# Patient Record
Sex: Male | Born: 1940 | Race: Black or African American | Hispanic: No | Marital: Married | Smoking: Former smoker
Health system: Southern US, Community
[De-identification: ages and names within clinical notes are randomized; demographics above are authoritative.]

## PROBLEM LIST (undated history)

## (undated) DIAGNOSIS — E039 Hypothyroidism, unspecified: Secondary | ICD-10-CM

## (undated) DIAGNOSIS — M549 Dorsalgia, unspecified: Secondary | ICD-10-CM

## (undated) DIAGNOSIS — R109 Unspecified abdominal pain: Secondary | ICD-10-CM

## (undated) DIAGNOSIS — R7989 Other specified abnormal findings of blood chemistry: Secondary | ICD-10-CM

## (undated) DIAGNOSIS — R7301 Impaired fasting glucose: Secondary | ICD-10-CM

## (undated) DIAGNOSIS — E119 Type 2 diabetes mellitus without complications: Secondary | ICD-10-CM

## (undated) DIAGNOSIS — Z125 Encounter for screening for malignant neoplasm of prostate: Secondary | ICD-10-CM

## (undated) DIAGNOSIS — E785 Hyperlipidemia, unspecified: Secondary | ICD-10-CM

## (undated) DIAGNOSIS — G5600 Carpal tunnel syndrome, unspecified upper limb: Secondary | ICD-10-CM

## (undated) DIAGNOSIS — I10 Essential (primary) hypertension: Secondary | ICD-10-CM

## (undated) DIAGNOSIS — H6120 Impacted cerumen, unspecified ear: Secondary | ICD-10-CM

## (undated) DIAGNOSIS — M25569 Pain in unspecified knee: Secondary | ICD-10-CM

## (undated) HISTORY — PX: OTHER SURGICAL HISTORY: SHX169

## (undated) HISTORY — DX: Hyperlipidemia, unspecified: E78.5

## (undated) HISTORY — DX: Essential (primary) hypertension: I10

## (undated) HISTORY — DX: Impacted cerumen, unspecified ear: H61.20

## (undated) HISTORY — DX: Unspecified abdominal pain: R10.9

## (undated) HISTORY — DX: Hypothyroidism, unspecified: E03.9

## (undated) HISTORY — DX: Encounter for screening for malignant neoplasm of prostate: Z12.5

## (undated) HISTORY — DX: Pain in unspecified knee: M25.569

## (undated) HISTORY — DX: Impaired fasting glucose: R73.01

## (undated) HISTORY — DX: Other specified abnormal findings of blood chemistry: R79.89

## (undated) HISTORY — DX: Dorsalgia, unspecified: M54.9

## (undated) HISTORY — DX: Type 2 diabetes mellitus without complications: E11.9

## (undated) HISTORY — DX: Carpal tunnel syndrome, unspecified upper limb: G56.00

---
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
## 2008-09-30 HISTORY — PX: OTHER SURGICAL HISTORY: SHX169

## 2008-12-21 LAB — HM COLONOSCOPY: HM Colonoscopy: NORMAL

## 2010-03-03 ENCOUNTER — Inpatient Hospital Stay (HOSPITAL_COMMUNITY): Admission: RE | Admit: 2010-03-03 | Discharge: 2010-03-06 | Payer: Self-pay | Admitting: Orthopedic Surgery

## 2010-03-03 HISTORY — PX: BACK SURGERY: SHX140

## 2010-07-21 ENCOUNTER — Encounter: Admission: RE | Admit: 2010-07-21 | Discharge: 2010-07-21 | Payer: Self-pay | Admitting: Internal Medicine

## 2011-03-13 LAB — GLUCOSE, CAPILLARY
Glucose-Capillary: 116 mg/dL — ABNORMAL HIGH (ref 70–99)
Glucose-Capillary: 138 mg/dL — ABNORMAL HIGH (ref 70–99)
Glucose-Capillary: 143 mg/dL — ABNORMAL HIGH (ref 70–99)
Glucose-Capillary: 181 mg/dL — ABNORMAL HIGH (ref 70–99)
Glucose-Capillary: 188 mg/dL — ABNORMAL HIGH (ref 70–99)
Glucose-Capillary: 189 mg/dL — ABNORMAL HIGH (ref 70–99)
Glucose-Capillary: 229 mg/dL — ABNORMAL HIGH (ref 70–99)

## 2011-03-13 LAB — TYPE AND SCREEN: Antibody Screen: NEGATIVE

## 2011-03-13 LAB — CBC
HCT: 47.3 % (ref 39.0–52.0)
MCHC: 34 g/dL (ref 30.0–36.0)
MCV: 92.6 fL (ref 78.0–100.0)
RBC: 5.11 MIL/uL (ref 4.22–5.81)
RDW: 12.4 % (ref 11.5–15.5)

## 2011-03-13 LAB — BASIC METABOLIC PANEL
Calcium: 10.1 mg/dL (ref 8.4–10.5)
Chloride: 101 mEq/L (ref 96–112)
GFR calc Af Amer: >60 mL/min (ref >60)
GFR calc non Af Amer: >60 mL/min (ref >60)

## 2012-03-06 ENCOUNTER — Other Ambulatory Visit: Payer: Self-pay | Admitting: Internal Medicine

## 2012-03-07 NOTE — Telephone Encounter (Signed)
Not out patient

## 2012-06-04 ENCOUNTER — Other Ambulatory Visit: Payer: Self-pay | Admitting: Internal Medicine

## 2012-06-04 NOTE — Telephone Encounter (Signed)
Pharmacy aware of reason for denial. 

## 2013-04-10 ENCOUNTER — Telehealth: Payer: Self-pay | Admitting: *Deleted

## 2013-04-10 NOTE — Telephone Encounter (Signed)
Patient called and stated that he would like to have the Nerve Conduction Study on both hands  [phone]

## 2013-04-12 NOTE — Telephone Encounter (Signed)
Ok.  Let's do that.  Electromyogram and nerve conduction study of bilateral upper extremities due to paresthesias of fingers, weakness.

## 2013-04-15 NOTE — Telephone Encounter (Signed)
[facility]  [address]  [place] [address] [phone] Phone  [phone] Fax   18 Apr 2011 at 7:30 AM  Patient has been notified

## 2013-04-24 ENCOUNTER — Other Ambulatory Visit: Payer: Self-pay | Admitting: *Deleted

## 2013-04-24 DIAGNOSIS — E785 Hyperlipidemia, unspecified: Secondary | ICD-10-CM

## 2013-04-24 DIAGNOSIS — R7301 Impaired fasting glucose: Secondary | ICD-10-CM

## 2013-04-24 DIAGNOSIS — E039 Hypothyroidism, unspecified: Secondary | ICD-10-CM

## 2013-04-24 DIAGNOSIS — I10 Essential (primary) hypertension: Secondary | ICD-10-CM

## 2013-05-19 ENCOUNTER — Other Ambulatory Visit: Payer: Self-pay

## 2013-05-22 ENCOUNTER — Ambulatory Visit: Payer: Self-pay | Admitting: Internal Medicine

## 2013-05-22 ENCOUNTER — Other Ambulatory Visit: Payer: Medicare Other

## 2013-05-22 ENCOUNTER — Encounter: Payer: Self-pay | Admitting: *Deleted

## 2013-05-22 DIAGNOSIS — E039 Hypothyroidism, unspecified: Secondary | ICD-10-CM

## 2013-05-22 DIAGNOSIS — E785 Hyperlipidemia, unspecified: Secondary | ICD-10-CM

## 2013-05-22 DIAGNOSIS — R7301 Impaired fasting glucose: Secondary | ICD-10-CM

## 2013-05-22 DIAGNOSIS — I10 Essential (primary) hypertension: Secondary | ICD-10-CM

## 2013-05-23 LAB — BASIC METABOLIC PANEL
BUN/Creatinine Ratio: 8 — ABNORMAL LOW (ref 10–22)
BUN: 8 mg/dL (ref 8–27)
CO2: 29 mmol/L — ABNORMAL HIGH (ref 19–28)
Calcium: 9.6 mg/dL (ref 8.6–10.2)
Chloride: 99 mmol/L (ref 97–108)
Creatinine, Ser: 0.98 mg/dL (ref 0.76–1.27)
GFR calc Af Amer: 90 mL/min/{1.73_m2} (ref >59)
GFR calc non Af Amer: 78 mL/min/{1.73_m2} (ref >59)
Glucose: 109 mg/dL — ABNORMAL HIGH (ref 65–99)
Potassium: 4 mmol/L (ref 3.5–5.2)
Sodium: 141 mmol/L (ref 134–144)

## 2013-05-23 LAB — LIPID PANEL
Chol/HDL Ratio: 3 ratio units (ref 0.0–5.0)
Cholesterol, Total: 155 mg/dL (ref 100–199)
HDL: 51 mg/dL (ref >39)
LDL Calculated: 83 mg/dL (ref 0–99)
Triglycerides: 104 mg/dL (ref 0–149)
VLDL Cholesterol Cal: 21 mg/dL (ref 5–40)

## 2013-05-23 LAB — HEMOGLOBIN A1C
Est. average glucose Bld gHb Est-mCnc: 134 mg/dL
Hgb A1c MFr Bld: 6.3 % — ABNORMAL HIGH (ref 4.8–5.6)

## 2013-05-23 LAB — TSH: TSH: 5.46 u[IU]/mL — ABNORMAL HIGH (ref 0.450–4.500)

## 2013-05-26 ENCOUNTER — Ambulatory Visit (INDEPENDENT_AMBULATORY_CARE_PROVIDER_SITE_OTHER): Payer: Medicare Other | Admitting: Internal Medicine

## 2013-05-26 ENCOUNTER — Encounter: Payer: Self-pay | Admitting: Internal Medicine

## 2013-05-26 VITALS — BP 106/72 | HR 60 | Temp 97.8°F | Resp 20 | Ht 65.5 in | Wt 148.0 lb

## 2013-05-26 DIAGNOSIS — I10 Essential (primary) hypertension: Secondary | ICD-10-CM

## 2013-05-26 DIAGNOSIS — E785 Hyperlipidemia, unspecified: Secondary | ICD-10-CM | POA: Insufficient documentation

## 2013-05-26 DIAGNOSIS — R7301 Impaired fasting glucose: Secondary | ICD-10-CM

## 2013-05-26 DIAGNOSIS — E039 Hypothyroidism, unspecified: Secondary | ICD-10-CM

## 2013-05-26 DIAGNOSIS — E1149 Type 2 diabetes mellitus with other diabetic neurological complication: Secondary | ICD-10-CM

## 2013-05-26 MED ORDER — METFORMIN HCL 500 MG PO TABS: 500.0000 mg | ORAL_TABLET | Freq: Two times a day (BID) | ORAL | Status: DC

## 2013-05-26 NOTE — Patient Instructions (Signed)
1800 Calorie Diet for Diabetes Meal Planning The 1800 calorie diet is designed for eating up to 1800 calories each day. Following this diet and making healthy meal choices can help improve overall health. This diet controls blood sugar (glucose) levels and can also help lower blood pressure and cholesterol. SERVING SIZES Measuring foods and serving sizes helps to make sure you are getting the right amount of food. The list below tells how big or small some common serving sizes are:  1 oz.........4 stacked dice.  3 oz........Marland KitchenDeck of cards.  1 tsp.......Marland KitchenTip of little finger.  1 tbs......Marland KitchenMarland KitchenThumb.  2 tbs.......Marland KitchenGolf ball.   cup......Marland KitchenHalf of a fist.  1 cup.......Marland KitchenA fist. GUIDELINES FOR CHOOSING FOODS The goal of this diet is to eat a variety of foods and limit calories to 1800 each day. This can be done by choosing foods that are low in calories and fat. The diet also suggests eating small amounts of food frequently. Doing this helps control your blood glucose levels so they do not get too high or too low. Each meal or snack may include a protein food source to help you feel more satisfied and to stabilize your blood glucose. Try to eat about the same amount of food around the same time each day. This includes weekend days, travel days, and days off work. Space your meals about 4 to 5 hours apart and add a snack between them if you wish.  For example, a daily food plan could include breakfast, a morning snack, lunch, dinner, and an evening snack. Healthy meals and snacks include whole grains, vegetables, fruits, lean meats, poultry, fish, and dairy products. As you plan your meals, select a variety of foods. Choose from the bread and starch, vegetable, fruit, dairy, and meat/protein groups. Examples of foods from each group and their suggested serving sizes are listed below. Use measuring cups and spoons to become familiar with what a healthy portion looks like. Bread and Starch Each serving  equals 15 grams of carbohydrates.  1 slice bread.   bagel.   cup cold cereal (unsweetened).   cup hot cereal or mashed potatoes.  1 small potato (size of a computer mouse).   cup cooked pasta or rice.   English muffin.  1 cup broth-based soup.  3 cups of popcorn.  4 to 6 whole-wheat crackers.   cup cooked beans, peas, or corn. Vegetable Each serving equals 5 grams of carbohydrates.   cup cooked vegetables.  1 cup raw vegetables.   cup tomato or vegetable juice. Fruit Each serving equals 15 grams of carbohydrates.  1 small apple or orange.  1 cup watermelon or strawberries.   cup applesauce (no sugar added).  2 tbs raisins.   banana.   cup canned fruit, packed in water, its own juice, or sweetened with a sugar substitute.   cup unsweetened fruit juice. Dairy Each serving equals 12 to 15 grams of carbohydrates.  1 cup fat-free milk.  6 oz artificially sweetened yogurt or plain yogurt.  1 cup low-fat buttermilk.  1 cup soy milk.  1 cup almond milk. Meat/Protein  1 large egg.  2 to 3 oz meat, poultry, or fish.   cup low-fat cottage cheese.  1 tbs peanut butter.  1 oz low-fat cheese.   cup tuna in water.   cup tofu. Fat  1 tsp oil.  1 tsp trans-fat-free margarine.  1 tsp butter.  1 tsp mayonnaise.  2 tbs avocado.  1 tbs salad dressing.  1 tbs cream cheese.  2 tbs sour cream. SAMPLE 1800 CALORIE DIET PLAN Breakfast   cup unsweetened cereal (1 carb serving).  1 cup fat-free milk (1 carb serving).  1 slice whole-wheat toast (1 carb serving).   small banana (1 carb serving).  1 scrambled egg.  1 tsp trans-fat-free margarine. Lunch  Tuna sandwich.  2 slices whole-wheat bread (2 carb servings).   cup canned tuna in water, drained.  1 tbs reduced fat mayonnaise.  1 stalk celery, chopped.  2 slices tomato.  1 lettuce leaf.  1 cup carrot sticks.  24 to 30 seedless grapes (2 carb  servings).  6 oz light yogurt (1 carb serving). Afternoon Snack  3 graham cracker squares (1 carb serving).  Fat-free milk, 1 cup (1 carb serving).  1 tbs peanut butter. Dinner  3 oz salmon, broiled with 1 tsp oil.  1 cup mashed potatoes (2 carb servings) with 1 tsp trans-fat-free margarine.  1 cup fresh or frozen green beans.  1 cup steamed asparagus.  1 cup fat-free milk (1 carb serving). Evening Snack  3 cups air-popped popcorn (1 carb serving).  2 tbs parmesan cheese sprinkled on top. MEAL PLAN Use this worksheet to help you make a daily meal plan based on the 1800 calorie diet suggestions. If you are using this plan to help you control your blood glucose, you may interchange carbohydrate-containing foods (dairy, starches, and fruits). Select a variety of fresh foods of varying colors and flavors. The total amount of carbohydrate in your meals or snacks is more important than making sure you include all of the food groups every time you eat. Choose from the following foods to build your day's meals:  8 Starches.  4 Vegetables.  3 Fruits.  2 Dairy.  6 to 7 oz Meat/Protein.  Up to 4 Fats. Your dietician can use this worksheet to help you decide how many servings and which types of foods are right for you. BREAKFAST Food Group and Servings / Food Choice Starch ________________________________________________________ Dairy _________________________________________________________ Fruit _________________________________________________________ Meat/Protein __________________________________________________ Fat ___________________________________________________________ LUNCH Food Group and Servings / Food Choice Starch ________________________________________________________ Meat/Protein __________________________________________________ Vegetable _____________________________________________________ Fruit  _________________________________________________________ Dairy _________________________________________________________ Fat ___________________________________________________________ Tyler Hull Food Group and Servings / Food Choice Starch ________________________________________________________ Meat/Protein __________________________________________________ Fruit __________________________________________________________ Dairy _________________________________________________________ Tyler Hull Food Group and Servings / Food Choice Starch _________________________________________________________ Meat/Protein ___________________________________________________ Dairy __________________________________________________________ Vegetable ______________________________________________________ Fruit ___________________________________________________________ Fat ____________________________________________________________ Tyler Hull Food Group and Servings / Food Choice Fruit __________________________________________________________ Meat/Protein ___________________________________________________ Dairy __________________________________________________________ Starch _________________________________________________________ DAILY TOTALS Starch ____________________________ Vegetable _________________________ Fruit _____________________________ Dairy _____________________________ Meat/Protein______________________ Fat _______________________________ Document Released: 06/26/2005 Document Revised: 02/26/2012 Document Reviewed: 10/20/2011 [employer] Patient Information 2014 [employer]  DRINK MORE WATER.  TRY WALKING MORE.

## 2013-05-26 NOTE — Progress Notes (Signed)
Patient ID: Tyler Hull, male   DOB: 1941-08-24, 72 y.o.   MRN: [id number]   Allergies  Allergen Reactions  . Penicillins   . Shellfish Allergy     Chief Complaint  Patient presents with  . Medical Managment of Chronic Issues    no new problems    HPI: Patient is a 72 y.o. AA male seen in the office today for f/u  He has cut out the french fries three times a week and the raisinets from [employer].  He still eats the goldfish in moderation.  Drinks a 20 oz soda twice a week.  Also drinks oj, apple juice, or pomegranate juice daily with breakfast.  Discussed drinking more water.  His wife is a model eater and exercises regularly and we discussed that he should follow her lead.    Review of Systems:  Review of Systems  Constitutional: Positive for weight loss. Negative for fever and malaise/fatigue.  HENT: Negative for congestion.   Eyes: Positive for blurred vision.  Respiratory: Negative for shortness of breath.   Cardiovascular: Negative for chest pain and leg swelling.  Gastrointestinal: Negative for abdominal pain and constipation.  Genitourinary: Negative for dysuria.  Musculoskeletal: Positive for joint pain. Negative for falls.  Skin: Negative for rash.  Neurological: Negative for loss of consciousness, weakness and headaches.  Psychiatric/Behavioral: Positive for memory loss.     Past Medical History  Diagnosis Date  . Carpal tunnel syndrome   . Impaired fasting glucose   . Other abnormal blood chemistry   . Special screening for malignant neoplasm of prostate   . Backache, unspecified   . Abdominal  pain, other specified site   . Impacted cerumen   . Unspecified hypothyroidism   . Essential hypertension, benign   . Unspecified essential hypertension   . Pain in joint, lower leg    Past Surgical History  Procedure Laterality Date  . (l) knee surgery"torn meniscus"     Social History:   reports that he has quit smoking. He does not have any smokeless  tobacco history on file. He reports that he does not drink alcohol or use illicit drugs.  No family history on file.  Medications: Patient's Medications  New Prescriptions   No medications on file  Previous Medications   ASPIRIN 81 MG TABLET    Take 81 mg by mouth daily. Take one tablet once a day   ATORVASTATIN (LIPITOR) 40 MG TABLET    Take 40 mg by mouth daily. Take one tablet once a day for cholesterol   CALCIUM CARBONATE-VITAMIN D (CALCIUM 600+D3) 600-400 MG-UNIT PER TABLET    Take 1 tablet by mouth daily. Take one tablet once a day   HYDROCHLOROTHIAZIDE (HYDRODIURIL) 25 MG TABLET    Take one and one half tablets by mouth once daily.   MULTIPLE VITAMINS-MINERALS (MULTIVITAMIN & MINERAL PO)    Take by mouth. Take one tablet once a day  Modified Medications   No medications on file  Discontinued Medications   No medications on file     Physical Exam:  Filed Vitals:   05/26/13 1635  BP: 106/72  Pulse: 60  Temp: 97.8 F (36.6 C)  TempSrc: Oral  Resp: 20  Height: 5' 5.5" (1.664 m)  Weight: 148 lb (67.132 kg)  SpO2: 98%  Physical Exam  Constitutional: He is oriented to person, place, and time. He appears well-developed and well-nourished. No distress.  HENT:  Head: Normocephalic and atraumatic.  Neck: Neck supple.  Cardiovascular:  Normal rate, regular rhythm, normal heart sounds and intact distal pulses.   Pulmonary/Chest: Effort normal and breath sounds normal. No respiratory distress.  Abdominal: Soft. Bowel sounds are normal. He exhibits no distension. There is no tenderness.  Musculoskeletal: Normal range of motion. He exhibits no edema and no tenderness.  Neurological: He is alert and oriented to person, place, and time.  Skin: Skin is warm and dry.       Labs reviewed: Basic Metabolic Panel:  Recent Labs  47/82/95 0820  NA 141  K 4.0  CL 99  CO2 29*  GLUCOSE 109*  BUN 8  CREATININE 0.98  CALCIUM 9.6  TSH 5.460*  Lipid Panel:  Recent Labs   05/22/13 0820  HDL 51  LDLCALC 83  TRIG 104  CHOLHDL 3.0   Assessment/Plan 1. Impaired fasting glucose -continued to encourage diet, exercise, has made some progress with his changes and he was commended for this, encouraged more water intake - Comprehensive metabolic panel; Future - Hemoglobin A1c; Future  2. Unspecified hypothyroidism -clinically and chemically euthyroid last check - TSH; Future  3. Essential hypertension, benign -at goal with current meds, avoid high sodium foods   4. Hyperlipidemia LDL goal < 100 -lipids at goal with current therapy and dietary changes, exercise  5. Type II or unspecified type diabetes mellitus with neurological manifestations, not stated as uncontrolled(250.60) -hba1c should improve by next check due to changes to diet he has made, weight loss and exercise  Labs/tests ordered:  Tsh, hba1c, cbc, bmp

## 2013-07-08 ENCOUNTER — Telehealth: Payer: Self-pay | Admitting: Geriatric Medicine

## 2013-07-08 NOTE — Telephone Encounter (Signed)
Patient says Metformin is making him lose a lot of weight. He has lost almost 10 pounds. Should he keep taking this medication? Please advise, thanks.

## 2013-07-09 NOTE — Telephone Encounter (Signed)
Metformin should help him lose weight.  If he has lost 10 lbs that gives him a normal bmi (weighing 138 lbs at 5 feet 5 in).  If his weight continues to drop b/c of diet and exercise, too, then we may need to reduce his dose or stop the medicine.  Please make sure his wife is also aware b/c he does not retain what I tell him.

## 2013-07-11 ENCOUNTER — Other Ambulatory Visit: Payer: Self-pay | Admitting: Geriatric Medicine

## 2013-07-11 NOTE — Telephone Encounter (Signed)
Spoke with patient and his wife. He will keep taking the medication and call us if he continues to lose weight.

## 2013-07-13 ENCOUNTER — Other Ambulatory Visit: Payer: Self-pay | Admitting: Nurse Practitioner

## 2013-08-08 ENCOUNTER — Other Ambulatory Visit: Payer: Self-pay | Admitting: Nurse Practitioner

## 2013-08-20 ENCOUNTER — Other Ambulatory Visit: Payer: Medicare Other

## 2013-08-20 DIAGNOSIS — R7301 Impaired fasting glucose: Secondary | ICD-10-CM

## 2013-08-20 DIAGNOSIS — E039 Hypothyroidism, unspecified: Secondary | ICD-10-CM

## 2013-08-20 DIAGNOSIS — I10 Essential (primary) hypertension: Secondary | ICD-10-CM

## 2013-08-21 LAB — CBC WITH DIFFERENTIAL/PLATELET
Basophils Absolute: 0 10*3/uL (ref 0.0–0.2)
Basos: 0 % (ref 0–3)
Eos: 3 % (ref 0–5)
Eosinophils Absolute: 0.3 10*3/uL (ref 0.0–0.4)
HCT: 44.7 % (ref 37.5–51.0)
Hemoglobin: 15.5 g/dL (ref 12.6–17.7)
Immature Grans (Abs): 0 10*3/uL (ref 0.0–0.1)
Immature Granulocytes: 0 % (ref 0–2)
Lymphocytes Absolute: 3.2 10*3/uL — ABNORMAL HIGH (ref 0.7–3.1)
Lymphs: 34 % (ref 14–46)
MCH: 30.8 pg (ref 26.6–33.0)
MCHC: 34.7 g/dL (ref 31.5–35.7)
MCV: 89 fL (ref 79–97)
Monocytes Absolute: 0.6 10*3/uL (ref 0.1–0.9)
Monocytes: 7 % (ref 4–12)
Neutrophils Absolute: 5.3 10*3/uL (ref 1.4–7.0)
Neutrophils Relative %: 56 % (ref 40–74)
RBC: 5.04 x10E6/uL (ref 4.14–5.80)
RDW: 12.7 % (ref 12.3–15.4)
WBC: 9.5 10*3/uL (ref 3.4–10.8)

## 2013-08-21 LAB — COMPREHENSIVE METABOLIC PANEL
ALT: 19 IU/L (ref 0–44)
AST: 19 IU/L (ref 0–40)
Albumin/Globulin Ratio: 2 (ref 1.1–2.5)
Albumin: 4.7 g/dL (ref 3.5–4.8)
Alkaline Phosphatase: 78 IU/L (ref 39–117)
BUN/Creatinine Ratio: 12 (ref 10–22)
BUN: 12 mg/dL (ref 8–27)
CO2: 27 mmol/L (ref 18–29)
Calcium: 9.6 mg/dL (ref 8.6–10.2)
Chloride: 99 mmol/L (ref 97–108)
Creatinine, Ser: 1.02 mg/dL (ref 0.76–1.27)
GFR calc Af Amer: 86 mL/min/{1.73_m2} (ref >59)
GFR calc non Af Amer: 74 mL/min/{1.73_m2} (ref >59)
Globulin, Total: 2.4 g/dL (ref 1.5–4.5)
Glucose: 87 mg/dL (ref 65–99)
Potassium: 3.9 mmol/L (ref 3.5–5.2)
Sodium: 141 mmol/L (ref 134–144)
Total Bilirubin: 0.8 mg/dL (ref 0.0–1.2)
Total Protein: 7.1 g/dL (ref 6.0–8.5)

## 2013-08-21 LAB — HEMOGLOBIN A1C
Est. average glucose Bld gHb Est-mCnc: 123 mg/dL
Hgb A1c MFr Bld: 5.9 % — ABNORMAL HIGH (ref 4.8–5.6)

## 2013-08-21 LAB — TSH: TSH: 3.97 u[IU]/mL (ref 0.450–4.500)

## 2013-08-22 ENCOUNTER — Other Ambulatory Visit: Payer: Self-pay

## 2013-08-22 ENCOUNTER — Ambulatory Visit (INDEPENDENT_AMBULATORY_CARE_PROVIDER_SITE_OTHER): Payer: Medicare Other | Admitting: Internal Medicine

## 2013-08-22 ENCOUNTER — Encounter: Payer: Self-pay | Admitting: Internal Medicine

## 2013-08-22 VITALS — BP 124/70 | HR 62 | Temp 97.9°F | Ht 65.0 in | Wt 141.2 lb

## 2013-08-22 DIAGNOSIS — E785 Hyperlipidemia, unspecified: Secondary | ICD-10-CM

## 2013-08-22 DIAGNOSIS — I10 Essential (primary) hypertension: Secondary | ICD-10-CM

## 2013-08-22 DIAGNOSIS — S8010XA Contusion of unspecified lower leg, initial encounter: Secondary | ICD-10-CM

## 2013-08-22 DIAGNOSIS — E1149 Type 2 diabetes mellitus with other diabetic neurological complication: Secondary | ICD-10-CM

## 2013-08-22 DIAGNOSIS — S8011XA Contusion of right lower leg, initial encounter: Secondary | ICD-10-CM

## 2013-08-22 DIAGNOSIS — E039 Hypothyroidism, unspecified: Secondary | ICD-10-CM

## 2013-08-22 MED ORDER — METFORMIN HCL 500 MG PO TABS: 250.0000 mg | ORAL_TABLET | Freq: Two times a day (BID) | ORAL | Status: DC

## 2013-08-22 NOTE — Progress Notes (Signed)
Patient ID: Tyler Hull, male   DOB: 1941-11-05, 72 y.o.   MRN: [id number] Location:  [facility] [place] [facility]  Code Status: need this from Misys chart where I previously reviewed it  Allergies  Allergen Reactions  . Penicillins   . Shellfish Allergy     Chief Complaint  Patient presents with  . Follow-up    3 month follow-up, discuss labs (copy printed)  . Leg Problem    injured right leg x 2 weeks ago  . Weight Loss  . Medication Management    Metformin causing GI issues     HPI: Patient is a 72 y.o. black male seen in the office today for f/u of diabetes, right leg injury, weight loss.  Ran into a metal platform with his right leg and has pressure in his shin now.  Mild tenderness where scabs are.    Is losing weight with metformin.  Feels like food is grinding up and feels nauseated sometimes.  Has bm soon after eating.  Weight down to 141 lbs.    Had NCS and carpal tunnel was identified, but he has chosen not to have surgery b/c his right hand is good for the time being.    Review of Systems:  Review of Systems  Constitutional: Positive for weight loss. Negative for fever and chills.  HENT: Positive for hearing loss. Negative for congestion.   Eyes: Negative for blurred vision.  Respiratory: Negative for cough and shortness of breath.   Cardiovascular: Negative for chest pain and leg swelling.  Gastrointestinal: Positive for nausea. Negative for abdominal pain, constipation, blood in stool and melena.  Genitourinary: Negative for dysuria, urgency and frequency.  Musculoskeletal: Negative for myalgias.       Right thigh pain and right lower leg pain  Neurological: Positive for sensory change. Negative for dizziness, speech change, focal weakness, loss of consciousness and headaches.  Psychiatric/Behavioral: Negative for depression.       Talks very quickly     Past Medical History  Diagnosis Date  . Carpal tunnel syndrome   . Impaired  fasting glucose   . Other abnormal blood chemistry   . Special screening for malignant neoplasm of prostate   . Backache, unspecified   . Abdominal  pain, other specified site   . Impacted cerumen   . Unspecified hypothyroidism   . Essential hypertension, benign   . Unspecified essential hypertension   . Pain in joint, lower leg     Past Surgical History  Procedure Laterality Date  . (l) knee surgery"torn meniscus"      Social History:   reports that he has quit smoking. He does not have any smokeless tobacco history on file. He reports that he does not drink alcohol or use illicit drugs.  History reviewed. No pertinent family history.  Medications: Patient's Medications  New Prescriptions   No medications on file  Previous Medications   ASPIRIN 81 MG TABLET    Take 81 mg by mouth daily. Take one tablet once a day   ATORVASTATIN (LIPITOR) 40 MG TABLET    TAKE 1 TABLET DAILY TO LOWER CHOLESTEROL   CALCIUM CARBONATE-VITAMIN D (CALCIUM 600+D3) 600-400 MG-UNIT PER TABLET    Take 1 tablet by mouth daily. Take one tablet once a day   HYDROCHLOROTHIAZIDE (HYDRODIURIL) 25 MG TABLET    TAKE 2 TABLETS ONCE DAILY FOR BLOOD PRESSURE   METFORMIN (GLUCOPHAGE) 500 MG TABLET    Take 1 tablet (500 mg total)  by mouth 2 (two) times daily with a meal.   MULTIPLE VITAMINS-MINERALS (MULTIVITAMIN & MINERAL PO)    Take by mouth. Take one tablet once a day  Modified Medications   No medications on file  Discontinued Medications   No medications on file     Physical Exam: Filed Vitals:   08/22/13 1127  BP: 124/70  Pulse: 62  Temp: 97.9 F (36.6 C)  TempSrc: Oral  Height: 5\' 5"  (1.651 m)  Weight: 141 lb 3.2 oz (64.048 kg)  SpO2: 97%  Physical Exam  Constitutional: He is oriented to person, place, and time. He appears well-developed and well-nourished. No distress.  HENT:  Head: Normocephalic and atraumatic.  Cardiovascular: Normal rate, regular rhythm, normal heart sounds and intact  distal pulses.   Pulmonary/Chest: Effort normal and breath sounds normal. No respiratory distress.  Abdominal: Soft. Bowel sounds are normal. He exhibits no distension and no mass.  Musculoskeletal: Normal range of motion.  Right lower leg with ecchymoses on anterior shin and several small eschars, tender to palpation  Neurological: He is alert and oriented to person, place, and time.  Psychiatric: He has a normal mood and affect.     Labs reviewed: Basic Metabolic Panel:  Recent Labs  16/10/96 0820 08/20/13 0819  NA 141 141  K 4.0 3.9  CL 99 99  CO2 29* 27  GLUCOSE 109* 87  BUN 8 12  CREATININE 0.98 1.02  CALCIUM 9.6 9.6  TSH 5.460* 3.970   Liver Function Tests:  Recent Labs  08/20/13 0819  AST 19  ALT 19  ALKPHOS 78  BILITOT 0.8  PROT 7.1  CBC:  Recent Labs  08/20/13 0819  WBC 9.5  NEUTROABS 5.3  HGB 15.5  HCT 44.7  MCV 89   Lipid Panel:  Recent Labs  05/22/13 0820  HDL 51  LDLCALC 83  TRIG 104  CHOLHDL 3.0   Lab Results  Component Value Date   HGBA1C 5.9* 08/20/2013   Assessment/Plan 1. Type II or unspecified type diabetes mellitus with neurological manifestations, not stated as uncontrolled(250.60) -cont low dose metformin which is helping with diabetic control and weight loss along with diet and minimal increase in activity - metFORMIN (GLUCOPHAGE) 500 MG tablet; Take 0.5 tablets (250 mg total) by mouth 2 (two) times daily with a meal.  Dispense: 90 tablet; Refill: 3 - Basic metabolic panel; Future - Hemoglobin A1c; Future  2. Unspecified hypothyroidism -cont current synthroid - TSH; Future  3. Hyperlipidemia LDL goal < 100 -cont statin therapy - Lipid panel; Future  4. Essential hypertension, benign -cont current bp meds and monitor - Basic metabolic panel; Future  5.  Right lower leg contusion -due to injury, seems to be healing appropriately, use tylenol prn pain  Labs/tests ordered: Orders Placed This Encounter  Procedures   . Basic metabolic panel    Standing Status: Future     Number of Occurrences: 1     Standing Expiration Date: 02/19/2014  . Hemoglobin A1c    Standing Status: Future     Number of Occurrences: 1     Standing Expiration Date: 02/19/2014  . Lipid panel    Standing Status: Future     Number of Occurrences: 1     Standing Expiration Date: 02/19/2014  . TSH    Standing Status: Future     Number of Occurrences: 1     Standing Expiration Date: 02/19/2014   Next appt:  3 mos

## 2013-09-17 ENCOUNTER — Other Ambulatory Visit: Payer: Self-pay | Admitting: Internal Medicine

## 2013-09-17 DIAGNOSIS — R200 Anesthesia of skin: Secondary | ICD-10-CM

## 2013-09-23 ENCOUNTER — Telehealth: Payer: Self-pay | Admitting: *Deleted

## 2013-09-23 ENCOUNTER — Ambulatory Visit: Payer: Self-pay

## 2013-09-23 NOTE — Telephone Encounter (Signed)
Patient called and stated that he had spoken with Amy concerning his leg pain and wanted to know what recommendations she suggested at the telephone call. I do not see any message in the chart and could not advise patient on their conversation. Patient stated that was fine and hung up.

## 2013-11-28 ENCOUNTER — Other Ambulatory Visit: Payer: Medicare Other

## 2013-11-28 ENCOUNTER — Encounter: Payer: Self-pay | Admitting: *Deleted

## 2013-11-28 DIAGNOSIS — E039 Hypothyroidism, unspecified: Secondary | ICD-10-CM

## 2013-11-28 DIAGNOSIS — I10 Essential (primary) hypertension: Secondary | ICD-10-CM

## 2013-11-28 DIAGNOSIS — E785 Hyperlipidemia, unspecified: Secondary | ICD-10-CM

## 2013-11-28 DIAGNOSIS — E1149 Type 2 diabetes mellitus with other diabetic neurological complication: Secondary | ICD-10-CM

## 2013-11-29 LAB — HEMOGLOBIN A1C
Est. average glucose Bld gHb Est-mCnc: 126 mg/dL
Hgb A1c MFr Bld: 6 % — ABNORMAL HIGH (ref 4.8–5.6)

## 2013-11-29 LAB — BASIC METABOLIC PANEL
BUN/Creatinine Ratio: 11 (ref 10–22)
BUN: 11 mg/dL (ref 8–27)
CO2: 31 mmol/L — ABNORMAL HIGH (ref 18–29)
Calcium: 10.1 mg/dL (ref 8.6–10.2)
Chloride: 99 mmol/L (ref 97–108)
Creatinine, Ser: 1.04 mg/dL (ref 0.76–1.27)
GFR calc Af Amer: 83 mL/min/{1.73_m2} (ref >59)
GFR calc non Af Amer: 72 mL/min/{1.73_m2} (ref >59)
Glucose: 96 mg/dL (ref 65–99)
Potassium: 4.1 mmol/L (ref 3.5–5.2)
Sodium: 142 mmol/L (ref 134–144)

## 2013-11-29 LAB — LIPID PANEL
Chol/HDL Ratio: 2.8 ratio units (ref 0.0–5.0)
Cholesterol, Total: 150 mg/dL (ref 100–199)
HDL: 54 mg/dL (ref >39)
LDL Calculated: 74 mg/dL (ref 0–99)
Triglycerides: 111 mg/dL (ref 0–149)
VLDL Cholesterol Cal: 22 mg/dL (ref 5–40)

## 2013-11-29 LAB — TSH: TSH: 2.69 u[IU]/mL (ref 0.450–4.500)

## 2013-12-01 ENCOUNTER — Ambulatory Visit (INDEPENDENT_AMBULATORY_CARE_PROVIDER_SITE_OTHER): Payer: Medicare Other | Admitting: Internal Medicine

## 2013-12-01 ENCOUNTER — Encounter: Payer: Self-pay | Admitting: Internal Medicine

## 2013-12-01 VITALS — BP 118/66 | HR 68 | Temp 97.8°F | Resp 14 | Wt 144.8 lb

## 2013-12-01 DIAGNOSIS — E785 Hyperlipidemia, unspecified: Secondary | ICD-10-CM

## 2013-12-01 DIAGNOSIS — E1149 Type 2 diabetes mellitus with other diabetic neurological complication: Secondary | ICD-10-CM

## 2013-12-01 DIAGNOSIS — S81811A Laceration without foreign body, right lower leg, initial encounter: Secondary | ICD-10-CM

## 2013-12-01 DIAGNOSIS — E039 Hypothyroidism, unspecified: Secondary | ICD-10-CM

## 2013-12-01 DIAGNOSIS — R200 Anesthesia of skin: Secondary | ICD-10-CM

## 2013-12-01 DIAGNOSIS — R209 Unspecified disturbances of skin sensation: Secondary | ICD-10-CM

## 2013-12-01 DIAGNOSIS — S81009A Unspecified open wound, unspecified knee, initial encounter: Secondary | ICD-10-CM

## 2013-12-01 DIAGNOSIS — I10 Essential (primary) hypertension: Secondary | ICD-10-CM

## 2013-12-01 NOTE — Progress Notes (Signed)
Patient ID: Tyler Hull, male   DOB: 1941/10/17, 72 y.o.   MRN: [id number]   Location:  [facility]   Allergies  Allergen Reactions  . Penicillins   . Shellfish Allergy     Chief Complaint  Patient presents with  . Medical Managment of Chronic Issues    f/u with labs printed.  [name] other    bump on he RT lower leg x 1 week.    HPI: Patient is a 72 y.o. black male seen in the office today for medical mgt of chronic diseases and a bump on his right lower leg for a week.  Ran into metal, was swollen, has been putting ice on it.  Has had his tetanus shot.    Went to therapy for numbness of right leg.  Dr Ranell Patrick said he may need to remove the nerve.  Still gets hot and painful and wakes from sleep sometimes.  Improved a little with therapy.  Review of Systems:  Review of Systems  Constitutional: Positive for weight loss. Negative for fever, chills and malaise/fatigue.  Eyes: Negative for blurred vision.  Respiratory: Negative for shortness of breath.   Cardiovascular: Negative for chest pain.  Gastrointestinal: Negative for abdominal pain, constipation, blood in stool and melena.  Genitourinary: Negative for dysuria.  Musculoskeletal: Negative for myalgias.  Skin:       Skin tear right shin from hitting against metal  Neurological: Positive for sensory change. Negative for headaches.       Right anterior thigh  Psychiatric/Behavioral: Positive for memory loss. Negative for depression.     Past Medical History  Diagnosis Date  . Carpal tunnel syndrome   . Impaired fasting glucose   . Other abnormal blood chemistry   . Special screening for malignant neoplasm of prostate   . Backache, unspecified   . Abdominal  pain, other specified site   . Impacted cerumen   . Unspecified hypothyroidism   . Essential hypertension, benign   . Unspecified essential hypertension   . Pain in joint, lower leg     Past Surgical History  Procedure  Laterality Date  . (l) knee surgery"torn meniscus"      Social History:   reports that he has quit smoking. He does not have any smokeless tobacco history on file. He reports that he does not drink alcohol or use illicit drugs.  History reviewed. No pertinent family history.  Medications: Patient's Medications  New Prescriptions   No medications on file  Previous Medications   ASPIRIN 81 MG TABLET    Take 81 mg by mouth daily. Take one tablet once a day   ATORVASTATIN (LIPITOR) 40 MG TABLET    TAKE 1 TABLET DAILY TO LOWER CHOLESTEROL   CALCIUM CARBONATE-VITAMIN D (CALCIUM 600+D3) 600-400 MG-UNIT PER TABLET    Take 1 tablet by mouth daily. Take one tablet once a day   HYDROCHLOROTHIAZIDE (HYDRODIURIL) 25 MG TABLET    TAKE 2 TABLETS ONCE DAILY FOR BLOOD PRESSURE   METFORMIN (GLUCOPHAGE) 500 MG TABLET    Take 0.5 tablets (250 mg total) by mouth 2 (two) times daily with a meal.   MULTIPLE VITAMINS-MINERALS (MULTIVITAMIN & MINERAL PO)    Take by mouth. Take one tablet once a day  Modified Medications   No medications on file  Discontinued Medications   No medications on file     Physical Exam: Filed Vitals:   12/01/13 1109  BP: 118/66  Pulse: 68  Temp: 97.8 F (36.6 C)  TempSrc: Oral  Resp: 14  Weight: 144 lb 12.8 oz (65.681 kg)  SpO2: 97%  Physical Exam  Constitutional: He is oriented to person, place, and time. He appears well-developed and well-nourished.  Cardiovascular: Normal rate, regular rhythm, normal heart sounds and intact distal pulses.   Pulmonary/Chest: Effort normal and breath sounds normal. No respiratory distress.  Abdominal: Soft. Bowel sounds are normal. He exhibits no distension. There is no tenderness.  Musculoskeletal: Normal range of motion. He exhibits no edema and no tenderness.  Neurological: He is alert and oriented to person, place, and time. No cranial nerve deficit.  Skin: Skin is warm and dry.  Small skin tear of right anterior shin, mild edema,  no erythema, drainage present  Psychiatric: He has a normal mood and affect.   Labs reviewed: Basic Metabolic Panel:  Recent Labs  09/81/19 0820 08/20/13 0819 11/28/13 0851  NA 141 141 142  K 4.0 3.9 4.1  CL 99 99 99  CO2 29* 27 31*  GLUCOSE 109* 87 96  BUN 8 12 11   CREATININE 0.98 1.02 1.04  CALCIUM 9.6 9.6 10.1  TSH 5.460* 3.970 2.690   Liver Function Tests:  Recent Labs  08/20/13 0819  AST 19  ALT 19  ALKPHOS 78  BILITOT 0.8  PROT 7.1  CBC:  Recent Labs  08/20/13 0819  WBC 9.5  NEUTROABS 5.3  HGB 15.5  HCT 44.7  MCV 89   Lipid Panel:  Recent Labs  05/22/13 0820 11/28/13 0851  HDL 51 54  LDLCALC 83 74  TRIG 104 111  CHOLHDL 3.0 2.8   Lab Results  Component Value Date   HGBA1C 6.0* 11/28/2013   Assessment/Plan 1. Type II or unspecified type diabetes mellitus with neurological manifestations, not stated as uncontrolled(250.60) -cont metformin for now--hba1c was 6 which is great -he is a very healthy 72 and does not have difficulty with hypoglycemia and eats well -f/u hba1c in 6 mos 2. Hyperlipidemia LDL goal < 100 -lipids finally at goal with dietary changes and walking -encouraged, cont statin 3. Unspecified hypothyroidism -TSH wnl with current therapy, clinically euthyroid 4. Essential hypertension, benign -bp at goal with current therapy, no changes, cont to watch sodium 5. Anterior thigh numbness -persists esp when seated and at night -cont to follow with Dr. Ranell Patrick and Dr. Shon Baton, had prior back surgery 6. Skin tear of lower leg without complication, right, initial encounter -keep clean and dry and watch for signs of infection Labs/tests ordered:  Cbc, cmp, flp, hba1c before next visit Next appt:  6 mos

## 2014-05-21 ENCOUNTER — Other Ambulatory Visit: Payer: Medicare Other

## 2014-05-21 DIAGNOSIS — I10 Essential (primary) hypertension: Secondary | ICD-10-CM

## 2014-05-21 DIAGNOSIS — E785 Hyperlipidemia, unspecified: Secondary | ICD-10-CM

## 2014-05-21 DIAGNOSIS — E1149 Type 2 diabetes mellitus with other diabetic neurological complication: Secondary | ICD-10-CM

## 2014-05-21 DIAGNOSIS — E039 Hypothyroidism, unspecified: Secondary | ICD-10-CM

## 2014-05-22 LAB — CBC WITH DIFFERENTIAL/PLATELET
Basophils Absolute: 0 10*3/uL (ref 0.0–0.2)
Basos: 0 %
Eos: 3 %
Eosinophils Absolute: 0.2 10*3/uL (ref 0.0–0.4)
HCT: 47.1 % (ref 37.5–51.0)
Hemoglobin: 16.1 g/dL (ref 12.6–17.7)
Immature Grans (Abs): 0 10*3/uL (ref 0.0–0.1)
Immature Granulocytes: 0 %
Lymphocytes Absolute: 3 10*3/uL (ref 0.7–3.1)
Lymphs: 33 %
MCH: 30.5 pg (ref 26.6–33.0)
MCHC: 34.2 g/dL (ref 31.5–35.7)
MCV: 89 fL (ref 79–97)
Monocytes Absolute: 0.6 10*3/uL (ref 0.1–0.9)
Monocytes: 7 %
Neutrophils Absolute: 5.3 10*3/uL (ref 1.4–7.0)
Neutrophils Relative %: 57 %
RBC: 5.28 x10E6/uL (ref 4.14–5.80)
RDW: 12.8 % (ref 12.3–15.4)
WBC: 9.2 10*3/uL (ref 3.4–10.8)

## 2014-05-22 LAB — LIPID PANEL
Chol/HDL Ratio: 3 ratio units (ref 0.0–5.0)
Cholesterol, Total: 149 mg/dL (ref 100–199)
HDL: 50 mg/dL (ref >39)
LDL Calculated: 81 mg/dL (ref 0–99)
Triglycerides: 92 mg/dL (ref 0–149)
VLDL Cholesterol Cal: 18 mg/dL (ref 5–40)

## 2014-05-22 LAB — COMPREHENSIVE METABOLIC PANEL
ALT: 21 IU/L (ref 0–44)
AST: 22 IU/L (ref 0–40)
Albumin/Globulin Ratio: 2 (ref 1.1–2.5)
Albumin: 4.7 g/dL (ref 3.5–4.8)
Alkaline Phosphatase: 79 IU/L (ref 39–117)
BUN/Creatinine Ratio: 10 (ref 10–22)
BUN: 10 mg/dL (ref 8–27)
CO2: 28 mmol/L (ref 18–29)
Calcium: 9.4 mg/dL (ref 8.6–10.2)
Chloride: 99 mmol/L (ref 97–108)
Creatinine, Ser: 1.04 mg/dL (ref 0.76–1.27)
GFR calc Af Amer: 83 mL/min/{1.73_m2} (ref >59)
GFR calc non Af Amer: 72 mL/min/{1.73_m2} (ref >59)
Globulin, Total: 2.3 g/dL (ref 1.5–4.5)
Glucose: 98 mg/dL (ref 65–99)
Potassium: 4 mmol/L (ref 3.5–5.2)
Sodium: 140 mmol/L (ref 134–144)
Total Bilirubin: 0.6 mg/dL (ref 0.0–1.2)
Total Protein: 7 g/dL (ref 6.0–8.5)

## 2014-05-22 LAB — HEMOGLOBIN A1C
Est. average glucose Bld gHb Est-mCnc: 126 mg/dL
Hgb A1c MFr Bld: 6 % — ABNORMAL HIGH (ref 4.8–5.6)

## 2014-05-22 LAB — TSH: TSH: 4.5 u[IU]/mL (ref 0.450–4.500)

## 2014-05-24 ENCOUNTER — Other Ambulatory Visit: Payer: Self-pay | Admitting: Internal Medicine

## 2014-05-25 ENCOUNTER — Ambulatory Visit (INDEPENDENT_AMBULATORY_CARE_PROVIDER_SITE_OTHER): Payer: Medicare Other | Admitting: Internal Medicine

## 2014-05-25 ENCOUNTER — Encounter: Payer: Self-pay | Admitting: Internal Medicine

## 2014-05-25 VITALS — BP 130/74 | HR 60 | Resp 10 | Wt 142.0 lb

## 2014-05-25 DIAGNOSIS — E039 Hypothyroidism, unspecified: Secondary | ICD-10-CM

## 2014-05-25 DIAGNOSIS — H53459 Other localized visual field defect, unspecified eye: Secondary | ICD-10-CM

## 2014-05-25 DIAGNOSIS — W19XXXA Unspecified fall, initial encounter: Secondary | ICD-10-CM

## 2014-05-25 DIAGNOSIS — E1149 Type 2 diabetes mellitus with other diabetic neurological complication: Secondary | ICD-10-CM

## 2014-05-25 DIAGNOSIS — E785 Hyperlipidemia, unspecified: Secondary | ICD-10-CM

## 2014-05-25 DIAGNOSIS — I10 Essential (primary) hypertension: Secondary | ICD-10-CM

## 2014-05-25 NOTE — Progress Notes (Signed)
Patient ID: Tyler Hull, male   DOB: 1941-01-14, 73 y.o.   MRN: [id number]   Location:  [facility]   Allergies  Allergen Reactions  . Penicillins   . Shellfish Allergy     Chief Complaint  Patient presents with  . Medical Management of Chronic Issues    6 month follow-up, discuss labs (copy printed)     HPI: Patient is a 73 y.o. black male seen in the office today for 6 month f/u.  About a month ago, he ran into a platform that the tv sits on in gazebo.  Went to pick something else, hit nose on it, fell over.  Last week, did same thing, hitting his forehead.  Put sponges all around the edges.  Did not lose consciousness either time.  Knows these happened due to his poor vision.    Has been exercising.  Walking on treadmill, doing silver sneakers aerobics and chair yoga and cardio--each are 45 mins, then he uses the equipment while his wife walks.    Labs reviewed--all good at this time.      Hip pain has gone away with exercise.  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      No numbness or tingling in the feet.  Still with tingling in his right hand.  Has carpal tunnel.    Takes 1/2 pill in the am and full pill at night.   Review of Systems:  Review of Systems  Constitutional: Negative for fever and malaise/fatigue.  HENT: Negative for congestion and hearing loss.   Eyes: Positive for double vision.  Respiratory: Negative for shortness of breath.   Cardiovascular: Negative for chest pain and leg swelling.  Gastrointestinal: Negative for abdominal pain, constipation, blood in stool and melena.  Genitourinary: Negative for dysuria.  Musculoskeletal: Positive for falls. Negative for myalgias.  Skin: Negative for rash.  Neurological: Positive for tingling and sensory change. Negative for dizziness, loss of consciousness and weakness.  Endo/Heme/Allergies: Does not bruise/bleed easily.  Psychiatric/Behavioral: Negative for  memory loss.    Past Medical History  Diagnosis Date  . Carpal tunnel syndrome   . Impaired fasting glucose   . Other abnormal blood chemistry   . Special screening for malignant neoplasm of prostate   . Backache, unspecified   . Abdominal pain, other specified site   . Impacted cerumen   . Unspecified hypothyroidism   . Essential hypertension, benign   . Unspecified essential hypertension   . Pain in joint, lower leg     Past Surgical History  Procedure Laterality Date  . (l) knee surgery"torn meniscus"      Social History:   reports that he has quit smoking. He does not have any smokeless tobacco history on file. He reports that he does not drink alcohol or use illicit drugs.  History reviewed. No pertinent family history.  Medications: Patient's Medications  New Prescriptions   No medications on file  Previous Medications   ASPIRIN 81 MG TABLET    Take 81 mg by mouth daily. Take one tablet once a day   ATORVASTATIN (LIPITOR) 40 MG TABLET    TAKE 1 TABLET DAILY TO LOWER CHOLESTEROL   CALCIUM CARBONATE-VITAMIN D (CALCIUM 600+D3) 600-400 MG-UNIT PER TABLET    Take 1 tablet by mouth daily. Take one tablet once a day   HYDROCHLOROTHIAZIDE (HYDRODIURIL) 25 MG TABLET    TAKE 2 TABLETS ONCE DAILY FOR BLOOD PRESSURE   METFORMIN (  GLUCOPHAGE) 500 MG TABLET    Take 0.5 tablets (250 mg total) by mouth 2 (two) times daily with a meal.   MULTIPLE VITAMINS-MINERALS (MULTIVITAMIN & MINERAL PO)    Take by mouth. Take one tablet once a day  Modified Medications   No medications on file  Discontinued Medications   No medications on file     Physical Exam: Filed Vitals:   05/25/14 1007  BP: 130/74  Pulse: 60  Resp: 10  Weight: 142 lb (64.411 kg)  SpO2: 97%  Physical Exam  Constitutional: He is oriented to person, place, and time. He appears well-developed and well-nourished. No distress.  HENT:  Head: Normocephalic and atraumatic.  Eyes: Pupils are equal, round, and reactive  to light.  Cardiovascular: Normal rate, regular rhythm, normal heart sounds and intact distal pulses.   Pulmonary/Chest: Effort normal and breath sounds normal. No respiratory distress.  Abdominal: Soft. Bowel sounds are normal. He exhibits no distension and no mass. There is no tenderness.  Musculoskeletal: Normal range of motion. He exhibits no edema and no tenderness.  Neurological: He is alert and oriented to person, place, and time.  Skin: Skin is warm and dry.  Psychiatric: He has a normal mood and affect.   Labs reviewed: Basic Metabolic Panel:  Recent Labs  16/09/9608/03/14 0819 11/28/13 0851 05/21/14 0900  NA 141 142 140  K 3.9 4.1 4.0  CL 99 99 99  CO2 27 31* 28  GLUCOSE 87 96 98  BUN 12 11 10   CREATININE 1.02 1.04 1.04  CALCIUM 9.6 10.1 9.4  TSH 3.970 2.690 4.500   Liver Function Tests:  Recent Labs  08/20/13 0819 05/21/14 0900  AST 19 22  ALT 19 21  ALKPHOS 78 79  BILITOT 0.8 0.6  PROT 7.1 7.0  CBC:  Recent Labs  08/20/13 0819 05/21/14 0900  WBC 9.5 9.2  NEUTROABS 5.3 5.3  HGB 15.5 16.1  HCT 44.7 47.1  MCV 89 89   Lipid Panel:  Recent Labs  11/28/13 0851 05/21/14 0900  HDL 54 50  LDLCALC 74 81  TRIG 111 92  CHOLHDL 2.8 3.0   Lab Results  Component Value Date   HGBA1C 6.0* 05/21/2014   Assessment/Plan 1. Type II or unspecified type diabetes mellitus with neurological manifestations, not stated as uncontrolled -is well controlled with diet and exercise and low dose metformin as above -cont asa, statin, will need urine microalbumin at physical--not on ace--may need to change diuretic to ace  2. Hyperlipidemia LDL goal < 100 -cont diet, exercise and statin therapy--is at goal  3. Essential hypertension, benign -at goal with current therapy  4. Hypothyroidism -tsh wnl--has not required meds  5. Fall x2 since last appt -due to his left peripheral vision loss  6. Peripheral vision loss of left eye -occurred many years ago--tells story  about kitty litter going into his eye and then [facility] -here he has been seeing optometrist and was referred from there to an ophthalmologist at [facility], but he and his wife don't want to drive there and would like a "one stop shop" (pt [facility]) -will refer to opthalmologist locally, Dr. Mercy RidingHecker--is due for annual exam in August  Labs/tests ordered:   Orders Placed This Encounter  Procedures  . CBC With differential/Platelet    Standing Status: Future     Number of Occurrences:      Standing Expiration Date: 11/24/2014  . Lipid panel  Standing Status: Future     Number of Occurrences:      Standing Expiration Date: 11/24/2014    Order Specific Question:  Has the patient fasted?    Answer:  Yes  . Hemoglobin A1c    Standing Status: Future     Number of Occurrences:      Standing Expiration Date: 11/24/2014  . Comprehensive metabolic panel    Standing Status: Future     Number of Occurrences:      Standing Expiration Date: 11/24/2014    Order Specific Question:  Has the patient fasted?    Answer:  Yes  . TSH    Standing Status: Future     Number of Occurrences:      Standing Expiration Date: 11/24/2014  . Ambulatory referral to Ophthalmology    Referral Priority:  Routine    Referral Type:  Consultation    Referral Reason:  Specialty Services Required    Requested Specialty:  Ophthalmology    Number of Visits Requested:  1    Next appt:6 months for annual exam labs before

## 2014-06-05 ENCOUNTER — Emergency Department (HOSPITAL_COMMUNITY)
Admission: EM | Admit: 2014-06-05 | Discharge: 2014-06-05 | Disposition: A | Discharge status: Home / Self Care | Payer: Medicare Other | Attending: Emergency Medicine | Admitting: Emergency Medicine

## 2014-06-05 ENCOUNTER — Telehealth: Payer: Self-pay | Admitting: *Deleted

## 2014-06-05 ENCOUNTER — Other Ambulatory Visit: Payer: Medicare Other

## 2014-06-05 ENCOUNTER — Other Ambulatory Visit: Payer: Self-pay | Admitting: *Deleted

## 2014-06-05 ENCOUNTER — Encounter (HOSPITAL_COMMUNITY): Payer: Self-pay | Admitting: Emergency Medicine

## 2014-06-05 DIAGNOSIS — Z87891 Personal history of nicotine dependence: Secondary | ICD-10-CM | POA: Insufficient documentation

## 2014-06-05 DIAGNOSIS — Z862 Personal history of diseases of the blood and blood-forming organs and certain disorders involving the immune mechanism: Secondary | ICD-10-CM | POA: Insufficient documentation

## 2014-06-05 DIAGNOSIS — Z79899 Other long term (current) drug therapy: Secondary | ICD-10-CM | POA: Insufficient documentation

## 2014-06-05 DIAGNOSIS — Z880 Allergy status to penicillin: Secondary | ICD-10-CM | POA: Insufficient documentation

## 2014-06-05 DIAGNOSIS — Z8639 Personal history of other endocrine, nutritional and metabolic disease: Secondary | ICD-10-CM | POA: Insufficient documentation

## 2014-06-05 DIAGNOSIS — Z7982 Long term (current) use of aspirin: Secondary | ICD-10-CM | POA: Insufficient documentation

## 2014-06-05 DIAGNOSIS — K625 Hemorrhage of anus and rectum: Secondary | ICD-10-CM | POA: Insufficient documentation

## 2014-06-05 DIAGNOSIS — K648 Other hemorrhoids: Secondary | ICD-10-CM | POA: Insufficient documentation

## 2014-06-05 DIAGNOSIS — Z8669 Personal history of other diseases of the nervous system and sense organs: Secondary | ICD-10-CM | POA: Insufficient documentation

## 2014-06-05 DIAGNOSIS — I10 Essential (primary) hypertension: Secondary | ICD-10-CM | POA: Insufficient documentation

## 2014-06-05 DIAGNOSIS — K5900 Constipation, unspecified: Secondary | ICD-10-CM | POA: Insufficient documentation

## 2014-06-05 LAB — COMPREHENSIVE METABOLIC PANEL
ALT: 20 U/L (ref 0–53)
AST: 25 U/L (ref 0–37)
Albumin: 4.3 g/dL (ref 3.5–5.2)
Alkaline Phosphatase: 79 U/L (ref 39–117)
BUN: 11 mg/dL (ref 6–23)
CALCIUM: 9.3 mg/dL (ref 8.4–10.5)
CO2: 27 mEq/L (ref 19–32)
CREATININE: 0.85 mg/dL (ref 0.50–1.35)
Chloride: 100 mEq/L (ref 96–112)
GFR calc non Af Amer: 86 mL/min — ABNORMAL LOW (ref >90)
GLUCOSE: 106 mg/dL — AB (ref 70–99)
Potassium: 3.7 mEq/L (ref 3.7–5.3)
Sodium: 140 mEq/L (ref 137–147)
TOTAL PROTEIN: 7.2 g/dL (ref 6.0–8.3)
Total Bilirubin: 0.7 mg/dL (ref 0.3–1.2)

## 2014-06-05 LAB — CBC
HCT: 43.6 % (ref 39.0–52.0)
HEMOGLOBIN: 15.1 g/dL (ref 13.0–17.0)
MCH: 30.6 pg (ref 26.0–34.0)
MCHC: 34.6 g/dL (ref 30.0–36.0)
MCV: 88.3 fL (ref 78.0–100.0)
Platelets: 279 10*3/uL (ref 150–400)
RBC: 4.94 MIL/uL (ref 4.22–5.81)
RDW: 12.1 % (ref 11.5–15.5)
WBC: 8.8 10*3/uL (ref 4.0–10.5)

## 2014-06-05 MED ORDER — HYDROCORTISONE ACETATE 25 MG RE SUPP: 25.0000 mg | Freq: Two times a day (BID) | RECTAL | Status: DC

## 2014-06-05 MED ORDER — DOCUSATE SODIUM 100 MG PO CAPS: 100.0000 mg | ORAL_CAPSULE | Freq: Two times a day (BID) | ORAL | Status: DC

## 2014-06-05 NOTE — ED Provider Notes (Signed)
CSN: [id number]     Arrival date & time 06/05/14  0827 History   First MD Initiated Contact with Patient 06/05/14 0831     Chief Complaint  Patient presents with  . Rectal Bleeding      HPI  Patient presents with endplate of blood in the stools. He's been noticing blood with bowel movements for the last 2 days. Does not have passage of blood without bowel movements. He has some hard firm stools. And feels as though is passing stools easily. Has blood that comes with a bowel movement. Gray-white she's not see blood. Has not had pain or lumps or tenderness to suggest external hemorrhoids. Is not lightheaded weak or dizzy. States it is only enough blood that would "cover a piece of toilet paper".  He has had 3 routine screening colonoscopies in his life. His most recent was 8 years ago. States this is normal. He's not had polyps, has diverticuli. He did have "surgery" for external hemorrhoids 40 years ago.  Past Medical History  Diagnosis Date  . Carpal tunnel syndrome   . Impaired fasting glucose   . Other abnormal blood chemistry   . Special screening for malignant neoplasm of prostate   . Backache, unspecified   . Abdominal pain, other specified site   . Impacted cerumen   . Unspecified hypothyroidism   . Essential hypertension, benign   . Unspecified essential hypertension   . Pain in joint, lower leg    Past Surgical History  Procedure Laterality Date  . (l) knee surgery"torn meniscus"     No family history on file. History  Substance Use Topics  . Smoking status: Former Smoker -- 13 years  . Smokeless tobacco: Not on file     Comment: Quit at age 73   . Alcohol Use: No    Review of Systems  Constitutional: Negative for fever, chills, diaphoresis, appetite change and fatigue.  HENT: Negative for mouth sores, sore throat and trouble swallowing.   Eyes: Negative for visual disturbance.  Respiratory: Negative for cough, chest tightness, shortness of breath and wheezing.     Cardiovascular: Negative for chest pain.  Gastrointestinal: Positive for constipation and anal bleeding. Negative for nausea, vomiting, abdominal pain, diarrhea and abdominal distention.  Endocrine: Negative for polydipsia, polyphagia and polyuria.  Genitourinary: Negative for dysuria, frequency and hematuria.  Musculoskeletal: Negative for gait problem.  Skin: Negative for color change, pallor and rash.  Neurological: Negative for dizziness, syncope, light-headedness and headaches.  Hematological: Does not bruise/bleed easily.  Psychiatric/Behavioral: Negative for behavioral problems and confusion.      Allergies  Penicillins and Shellfish allergy  Home Medications   Prior to Admission medications   Medication Sig Start Date End Date Taking? Authorizing Gunnard Dorrance  aspirin 81 MG tablet Take 81 mg by mouth daily.    Yes Historical Aricka Goldberger, MD  atorvastatin (LIPITOR) 40 MG tablet Take 40 mg by mouth daily.   Yes Historical Jennea Rager, MD  Calcium Carbonate-Vitamin D (CALCIUM 600+D3) 600-400 MG-UNIT per tablet Take 1 tablet by mouth daily.    Yes Historical Nygel Prokop, MD  hydrochlorothiazide (HYDRODIURIL) 25 MG tablet Take 37.5 mg by mouth daily.   Yes Historical Kehinde Bowdish, MD  metFORMIN (GLUCOPHAGE) 500 MG tablet Take 0.5 tablets (250 mg total) by mouth 2 (two) times daily with a meal. 08/22/13  Yes Tiffany L Reed, DO  Multiple Vitamins-Minerals (MULTIVITAMIN & MINERAL PO) Take 1 tablet by mouth daily.    Yes Historical Josiyah Tozzi, MD   BP 151/66  Temp(Src) 98.2 F (36.8 C) (Oral)  Resp 12  Ht 5\' 5"  (1.651 m)  Wt 142 lb (64.411 kg)  BMI 23.63 kg/m2  SpO2 95% Physical Exam  Constitutional: He is oriented to person, place, and time. He appears well-developed and well-nourished. No distress.  HENT:  Head: Normocephalic.  Eyes: Conjunctivae are normal. Pupils are equal, round, and reactive to light. No scleral icterus.  Conjunctiva are not pale  Neck: Normal range of motion. Neck supple.  No thyromegaly present.  Cardiovascular: Normal rate and regular rhythm.  Exam reveals no gallop and no friction rub.   No murmur heard. Pulmonary/Chest: Effort normal and breath sounds normal. No respiratory distress. He has no wheezes. He has no rales.  Abdominal: Soft. Bowel sounds are normal. He exhibits no distension. There is no tenderness. There is no rebound.  Abdomen soft and benign  Genitourinary:     Musculoskeletal: Normal range of motion.  Neurological: He is alert and oriented to person, place, and time.  Skin: Skin is warm and dry. No rash noted.  Psychiatric: He has a normal mood and affect. His behavior is normal.    ED Course  Procedures (including critical care time) Labs Review Labs Reviewed  CBC  COMPREHENSIVE METABOLIC PANEL  POC OCCULT BLOOD, ED    Imaging Review No results found.   EKG Interpretation None      MDM   Final diagnoses:  None    The sound like hemorrhoidal bleeding. His only coming after passage of stools. Panel B. hemoglobin check. This is stable I think that Anusol suppository and stool softener with GI follow-up is all  that would be indicated.  Hemoglobin. No additional blood medically stable for outpatient treatment with Anusol suppositories as treatment for internal hemorrhoid. I discussed with him the hemorrhoid should bleed small amounts, only with bowel movements. If he does develop increasing bleeding or bleeding not associated bowel movements, or if he develops lightheadedness dizziness or other symptoms recheck here. Otherwise stool softener, suppository, and GI followup.  Rolland PorterMark James, MD 06/05/14 1146

## 2014-06-05 NOTE — Telephone Encounter (Signed)
Spoke with patient in the office, was told that he was to come in this  morning by Dr. Reed to have laRenato Gailsbs drawn due to having bleeding from the rectum.[name]. He also stated that they discussed that if he was still having bleeding to go to the hospital to be checked. He was still bleeding when he went to the restroom, and decided to go to the hospital.

## 2014-06-05 NOTE — ED Notes (Addendum)
Pt states he noticed some bright red blood in his stool this yesterday. Has hx of hemorrhoids 40 yrs ago with surgery. States there is blood in the commode but when he wipes just stool on the paper. Denies any pain with it.

## 2014-06-05 NOTE — ED Notes (Signed)
Dr. James at the bedside.  

## 2014-06-05 NOTE — Discharge Instructions (Signed)
Your bleeding is most likely from an internal hemorrhoid. Hemorrhoid should bleed small amounts only with bowel movements. If you have worsening bleeding, feel lightheaded, had bleeding associated with bowel movements, then recheck here.  Bloody Stools Bloody stools often mean that there is a problem in the digestive tract. Your caregiver may use the term "melena" to describe black, tarry, and bad smelling stools or "hematochezia" to describe red or maroon-colored stools. Blood seen in the stool can be caused by bleeding anywhere along the intestinal tract.  A black stool usually means that blood is coming from the upper part of the gastrointestinal tract (esophagus, stomach, or small bowel). Passing maroon-colored stools or bright red blood usually means that blood is coming from lower down in the large bowel or the rectum. However, sometimes massive bleeding in the stomach or small intestine can cause bright red bloody stools.  Consuming black licorice, lead, iron pills, medicines containing bismuth subsalicylate, or blueberries can also cause black stools. Your caregiver can test black stools to see if blood is present. It is important that the cause of the bleeding be found. Treatment can then be started, and the problem can be corrected. Rectal bleeding may not be serious, but you should not assume everything is okay until you know the cause.It is very important to follow up with your caregiver or a specialist in gastrointestinal problems. CAUSES  Blood in the stools can come from various underlying causes.Often, the cause is not found during your first visit. Testing is often needed to discover the cause of bleeding in the gastrointestinal tract. Causes range from simple to serious or even life-threatening.Possible causes include:  Hemorrhoids.These are veins that are full of blood (engorged) in the rectum. They cause pain, inflammation, and may bleed.  Anal fissures.These are areas of  painful tearing which may bleed. They are often caused by passing hard stool.  Diverticulosis.These are pouches that form on the colon over time, with age, and may bleed significantly.  Diverticulitis.This is inflammation in areas with diverticulosis. It can cause pain, fever, and bloody stools, although bleeding is rare.  Proctitis and colitis. These are inflamed areas of the rectum or colon. They may cause pain, fever, and bloody stools.  Polyps and cancer. Colon cancer is a leading cause of preventable cancer death.It often starts out as precancerous polyps that can be removed during a colonoscopy, preventing progression into cancer. Sometimes, polyps and cancer may cause rectal bleeding.  Gastritis and ulcers.Bleeding from the upper gastrointestinal tract (near the stomach) may travel through the intestines and produce black, sometimes tarry, often bad smelling stools. In certain cases, if the bleeding is fast enough, the stools may not be black, but red and the condition may be life-threatening. SYMPTOMS  You may have stools that are bright red and bloody, that are normal color with blood on them, or that are dark black and tarry. In some cases, you may only have blood in the toilet bowl. Any of these cases need medical care. You may also have:  Pain at the anus or anywhere in the rectum.  Lightheadedness or feeling faint.  Extreme weakness.  Nausea or vomiting.  Fever. DIAGNOSIS Your caregiver may use the following methods to find the cause of your bleeding:  Taking a medical history. Age is important. Older people tend to develop polyps and cancer more often. If there is anal pain and a hard, large stool associated with bleeding, a tear of the anus may be the cause. If blood drips  into the toilet after a bowel movement, bleeding hemorrhoids may be the problem. The color and frequency of the bleeding are additional considerations. In most cases, the medical history provides clues,  but seldom the final answer.  A visual and finger (digital) exam. Your caregiver will inspect the anal area, looking for tears and hemorrhoids. A finger exam can provide information when there is tenderness or a growth inside. In men, the prostate is also examined.  Endoscopy. Several types of small, long scopes (endoscopes) are used to view the colon.  In the office, your caregiver may use a rigid, or more commonly, a flexible viewing sigmoidoscope. This exam is called flexible sigmoidoscopy. It is performed in 5 to 10 minutes.  A more thorough exam is accomplished with a colonoscope. It allows your caregiver to view the entire 5 to 6 foot long colon. Medicine to help you relax (sedative) is usually given for this exam. Frequently, a bleeding lesion may be present beyond the reach of the sigmoidoscope. So, a colonoscopy may be the best exam to start with. Both exams are usually done on an outpatient basis. This means the patient does not stay overnight in the hospital or surgery center.  An upper endoscopy may be needed to examine your stomach. Sedation is used and a flexible endoscope is put in your mouth, down to your stomach.  A barium enema X-ray. This is an X-ray exam. It uses liquid barium inserted by enema into the rectum. This test alone may not identify an actual bleeding point. X-rays highlight abnormal shadows, such as those made by lumps (tumors), diverticuli, or colitis. TREATMENT  Treatment depends on the cause of your bleeding.   For bleeding from the stomach or colon, the caregiver doing your endoscopy or colonoscopy may be able to stop the bleeding as part of the procedure.  Inflammation or infection of the colon can be treated with medicines.  Many rectal problems can be treated with creams, suppositories, or warm baths.  Surgery is sometimes needed.  Blood transfusions are sometimes needed if you have lost a lot of blood.  For any bleeding problem, let your caregiver  know if you take aspirin or other blood thinners regularly. HOME CARE INSTRUCTIONS   Take any medicines exactly as prescribed.  Keep your stools soft by eating a diet high in fiber. Prunes (1 to 3 a day) work well for many people.  Drink enough water and fluids to keep your urine clear or pale yellow.  Take sitz baths if advised. A sitz bath is when you sit in a bathtub with warm water for 10 to 15 minutes to soak, soothe, and cleanse the rectal area.  If enemas or suppositories are advised, be sure you know how to use them. Tell your caregiver if you have problems with this.  Monitor your bowel movements to look for signs of improvement or worsening. SEEK MEDICAL CARE IF:   You do not improve in the time expected.  Your condition worsens after initial improvement.  You develop any new symptoms. SEEK IMMEDIATE MEDICAL CARE IF:   You develop severe or prolonged rectal bleeding.  You vomit blood.  You feel weak or faint.  You have a fever. MAKE SURE YOU:  Understand these instructions.  Will watch your condition.  Will get help right away if you are not doing well or get worse. Document Released: 11/24/2002 Document Revised: 02/26/2012 Document Reviewed: 04/21/2011 [facility] Patient Information 2015 [place]. This information is not intended to replace  advice given to you by your health care provider. Make sure you discuss any questions you have with your health care provider.  Hemorrhoids Hemorrhoids are swollen veins around the rectum or anus. There are two types of hemorrhoids:   Internal hemorrhoids. These occur in the veins just inside the rectum. They may poke through to the outside and become irritated and painful.  External hemorrhoids. These occur in the veins outside the anus and can be felt as a painful swelling or hard lump near the anus. CAUSES  Pregnancy.   Obesity.   Constipation or diarrhea.   Straining to have a bowel movement.    Sitting for long periods on the toilet.  Heavy lifting or other activity that caused you to strain.  Anal intercourse. SYMPTOMS   Pain.   Anal itching or irritation.   Rectal bleeding.   Fecal leakage.   Anal swelling.   One or more lumps around the anus.  DIAGNOSIS  Your caregiver may be able to diagnose hemorrhoids by visual examination. Other examinations or tests that may be performed include:   Examination of the rectal area with a gloved hand (digital rectal exam).   Examination of anal canal using a small tube (scope).   A blood test if you have lost a significant amount of blood.  A test to look inside the colon (sigmoidoscopy or colonoscopy). TREATMENT Most hemorrhoids can be treated at home. However, if symptoms do not seem to be getting better or if you have a lot of rectal bleeding, your caregiver may perform a procedure to help make the hemorrhoids get smaller or remove them completely. Possible treatments include:   Placing a rubber band at the base of the hemorrhoid to cut off the circulation (rubber band ligation).   Injecting a chemical to shrink the hemorrhoid (sclerotherapy).   Using a tool to burn the hemorrhoid (infrared light therapy).   Surgically removing the hemorrhoid (hemorrhoidectomy).   Stapling the hemorrhoid to block blood flow to the tissue (hemorrhoid stapling).  HOME CARE INSTRUCTIONS   Eat foods with fiber, such as whole grains, beans, nuts, fruits, and vegetables. Ask your doctor about taking products with added fiber in them (fibersupplements).  Increase fluid intake. Drink enough water and fluids to keep your urine clear or pale yellow.   Exercise regularly.   Go to the bathroom when you have the urge to have a bowel movement. Do not wait.   Avoid straining to have bowel movements.   Keep the anal area dry and clean. Use wet toilet paper or moist towelettes after a bowel movement.   Medicated creams  and suppositories may be used or applied as directed.   Only take over-the-counter or prescription medicines as directed by your caregiver.   Take warm sitz baths for 15-20 minutes, 3-4 times a day to ease pain and discomfort.   Place ice packs on the hemorrhoids if they are tender and swollen. Using ice packs between sitz baths may be helpful.   Put ice in a plastic bag.   Place a towel between your skin and the bag.   Leave the ice on for 15-20 minutes, 3-4 times a day.   Do not use a donut-shaped pillow or sit on the toilet for long periods. This increases blood pooling and pain.  SEEK MEDICAL CARE IF:  You have increasing pain and swelling that is not controlled by treatment or medicine.  You have uncontrolled bleeding.  You have difficulty or you are unable  to have a bowel movement.  You have pain or inflammation outside the area of the hemorrhoids. MAKE SURE YOU:  Understand these instructions.  Will watch your condition.  Will get help right away if you are not doing well or get worse. Document Released: 12/01/2000 Document Revised: 11/20/2012 Document Reviewed: 10/08/2012 [facility] Patient Information 2015 [place], [place]. This information is not intended to replace advice given to you by your health care provider. Make sure you discuss any questions you have with your health care provider.

## 2014-06-18 ENCOUNTER — Ambulatory Visit: Payer: Medicare Other | Admitting: Internal Medicine

## 2014-07-01 LAB — HM COLONOSCOPY

## 2014-07-10 ENCOUNTER — Other Ambulatory Visit: Payer: Self-pay | Admitting: Internal Medicine

## 2014-07-11 ENCOUNTER — Other Ambulatory Visit: Payer: Self-pay | Admitting: Internal Medicine

## 2014-08-31 ENCOUNTER — Ambulatory Visit (INDEPENDENT_AMBULATORY_CARE_PROVIDER_SITE_OTHER): Payer: Medicare Other

## 2014-08-31 DIAGNOSIS — Z23 Encounter for immunization: Secondary | ICD-10-CM

## 2014-10-07 ENCOUNTER — Other Ambulatory Visit: Payer: Self-pay

## 2014-10-07 DIAGNOSIS — H5340 Unspecified visual field defects: Secondary | ICD-10-CM

## 2014-10-08 ENCOUNTER — Telehealth: Payer: Self-pay

## 2014-10-08 DIAGNOSIS — H5340 Unspecified visual field defects: Secondary | ICD-10-CM

## 2014-10-08 NOTE — Telephone Encounter (Signed)
[place] from Dr.Hecker's office called indicating MRI of brain & MRI orbits with and without contrast for both.  Orders placed

## 2014-10-08 NOTE — Telephone Encounter (Signed)
[facility] that we place order for labs: CBCD/Erythrocyte Sed Rate/B12/Iron Studies/CRP. Fax also requested order to be placed for MRI with orbits and limited brain.   [facility] requesting diagnosis codes and more details for MRI (with or without)  Fax was returned with diagnosis code, no details for MRI.  I called and left message @ [phone] for Dr.Hecker's office. 2 message left within 48 hours. No return call as of yet

## 2014-10-12 ENCOUNTER — Other Ambulatory Visit: Payer: Self-pay

## 2014-10-14 ENCOUNTER — Other Ambulatory Visit: Payer: Medicare Other

## 2014-10-14 DIAGNOSIS — E785 Hyperlipidemia, unspecified: Secondary | ICD-10-CM

## 2014-10-14 DIAGNOSIS — E039 Hypothyroidism, unspecified: Secondary | ICD-10-CM

## 2014-10-14 DIAGNOSIS — H5340 Unspecified visual field defects: Secondary | ICD-10-CM

## 2014-10-14 DIAGNOSIS — E1149 Type 2 diabetes mellitus with other diabetic neurological complication: Secondary | ICD-10-CM

## 2014-10-14 DIAGNOSIS — I10 Essential (primary) hypertension: Secondary | ICD-10-CM

## 2014-10-15 LAB — COMPREHENSIVE METABOLIC PANEL
ALT: 23 IU/L (ref 0–44)
AST: 23 IU/L (ref 0–40)
Albumin/Globulin Ratio: 2.2 (ref 1.1–2.5)
Albumin: 4.8 g/dL (ref 3.5–4.8)
Alkaline Phosphatase: 80 IU/L (ref 39–117)
BUN/Creatinine Ratio: 11 (ref 10–22)
BUN: 11 mg/dL (ref 8–27)
CO2: 30 mmol/L — ABNORMAL HIGH (ref 18–29)
Calcium: 9.9 mg/dL (ref 8.6–10.2)
Chloride: 96 mmol/L — ABNORMAL LOW (ref 97–108)
Creatinine, Ser: 1 mg/dL (ref 0.76–1.27)
GFR calc Af Amer: 87 mL/min/{1.73_m2} (ref >59)
GFR calc non Af Amer: 75 mL/min/{1.73_m2} (ref >59)
Globulin, Total: 2.2 g/dL (ref 1.5–4.5)
Glucose: 95 mg/dL (ref 65–99)
Potassium: 4.1 mmol/L (ref 3.5–5.2)
Sodium: 139 mmol/L (ref 134–144)
Total Bilirubin: 0.6 mg/dL (ref 0.0–1.2)
Total Protein: 7 g/dL (ref 6.0–8.5)

## 2014-10-15 LAB — CBC WITH DIFFERENTIAL
Basophils Absolute: 0 10*3/uL (ref 0.0–0.2)
Basos: 0 %
Eos: 3 %
Eosinophils Absolute: 0.3 10*3/uL (ref 0.0–0.4)
HCT: 46.4 % (ref 37.5–51.0)
Hemoglobin: 16.2 g/dL (ref 12.6–17.7)
Immature Grans (Abs): 0 10*3/uL (ref 0.0–0.1)
Immature Granulocytes: 0 %
Lymphocytes Absolute: 2.6 10*3/uL (ref 0.7–3.1)
Lymphs: 29 %
MCH: 30.7 pg (ref 26.6–33.0)
MCHC: 34.9 g/dL (ref 31.5–35.7)
MCV: 88 fL (ref 79–97)
Monocytes Absolute: 0.5 10*3/uL (ref 0.1–0.9)
Monocytes: 6 %
Neutrophils Absolute: 5.5 10*3/uL (ref 1.4–7.0)
Neutrophils Relative %: 62 %
Platelets: 330 10*3/uL (ref 150–379)
RBC: 5.28 x10E6/uL (ref 4.14–5.80)
RDW: 12.6 % (ref 12.3–15.4)
WBC: 8.9 10*3/uL (ref 3.4–10.8)

## 2014-10-15 LAB — MICROALBUMIN / CREATININE URINE RATIO
Creatinine, Ur: 172.7 mg/dL (ref 22.0–328.0)
MICROALB/CREAT RATIO: 11.1 mg/g creat (ref 0.0–30.0)
Microalbumin, Urine: 19.2 ug/mL — ABNORMAL HIGH (ref 0.0–17.0)

## 2014-10-15 LAB — IRON AND TIBC
Iron Saturation: 27 % (ref 15–55)
Iron: 80 ug/dL (ref 40–155)
TIBC: 298 ug/dL (ref 250–450)
UIBC: 218 ug/dL (ref 150–375)

## 2014-10-15 LAB — HEMOGLOBIN A1C
Est. average glucose Bld gHb Est-mCnc: 123 mg/dL
Hgb A1c MFr Bld: 5.9 % — ABNORMAL HIGH (ref 4.8–5.6)

## 2014-10-15 LAB — C-REACTIVE PROTEIN: CRP: 0.3 mg/L (ref 0.0–4.9)

## 2014-10-15 LAB — LIPID PANEL
Chol/HDL Ratio: 2.6 ratio units (ref 0.0–5.0)
Cholesterol, Total: 139 mg/dL (ref 100–199)
HDL: 53 mg/dL (ref >39)
LDL Calculated: 68 mg/dL (ref 0–99)
Triglycerides: 90 mg/dL (ref 0–149)
VLDL Cholesterol Cal: 18 mg/dL (ref 5–40)

## 2014-10-15 LAB — SEDIMENTATION RATE: Sed Rate: 2 mm/hr (ref 0–30)

## 2014-10-15 LAB — VITAMIN B12: Vitamin B-12: 698 pg/mL (ref 211–946)

## 2014-10-15 LAB — TSH: TSH: 3.83 u[IU]/mL (ref 0.450–4.500)

## 2014-10-21 ENCOUNTER — Inpatient Hospital Stay: Admission: RE | Admit: 2014-10-21 | Payer: Medicare Other | Source: Ambulatory Visit

## 2014-10-21 ENCOUNTER — Other Ambulatory Visit: Payer: Medicare Other

## 2014-10-26 ENCOUNTER — Other Ambulatory Visit: Payer: Self-pay | Admitting: Ophthalmology

## 2014-10-26 DIAGNOSIS — H5340 Unspecified visual field defects: Secondary | ICD-10-CM

## 2014-10-27 ENCOUNTER — Other Ambulatory Visit: Payer: Medicare Other

## 2014-10-27 ENCOUNTER — Ambulatory Visit
Admission: RE | Admit: 2014-10-27 | Discharge: 2014-10-27 | Disposition: A | Discharge status: Home / Self Care | Payer: Medicare Other | Source: Ambulatory Visit | Attending: Ophthalmology | Admitting: Ophthalmology

## 2014-10-27 DIAGNOSIS — H5340 Unspecified visual field defects: Secondary | ICD-10-CM

## 2014-10-27 MED ORDER — GADOBENATE DIMEGLUMINE 529 MG/ML IV SOLN
13.0000 mL | Freq: Once | INTRAVENOUS | Status: AC | PRN
  Administered 2014-10-27: 13 mL via INTRAVENOUS

## 2014-11-23 ENCOUNTER — Other Ambulatory Visit: Payer: Medicare Other

## 2014-11-23 DIAGNOSIS — I10 Essential (primary) hypertension: Secondary | ICD-10-CM

## 2014-11-24 ENCOUNTER — Other Ambulatory Visit: Payer: Medicare Other

## 2014-11-24 ENCOUNTER — Encounter: Payer: Self-pay | Admitting: *Deleted

## 2014-11-24 HISTORY — PX: EYE SURGERY: SHX253

## 2014-11-24 LAB — CBC WITH DIFFERENTIAL/PLATELET
Basophils Absolute: 0 10*3/uL (ref 0.0–0.2)
Basos: 0 %
Eos: 2 %
Eosinophils Absolute: 0.2 10*3/uL (ref 0.0–0.4)
HCT: 44.1 % (ref 37.5–51.0)
Hemoglobin: 15.1 g/dL (ref 12.6–17.7)
Immature Grans (Abs): 0 10*3/uL (ref 0.0–0.1)
Immature Granulocytes: 0 %
Lymphocytes Absolute: 3 10*3/uL (ref 0.7–3.1)
Lymphs: 37 %
MCH: 30.4 pg (ref 26.6–33.0)
MCHC: 34.2 g/dL (ref 31.5–35.7)
MCV: 89 fL (ref 79–97)
Monocytes Absolute: 0.7 10*3/uL (ref 0.1–0.9)
Monocytes: 8 %
Neutrophils Absolute: 4.3 10*3/uL (ref 1.4–7.0)
Neutrophils Relative %: 53 %
RBC: 4.96 x10E6/uL (ref 4.14–5.80)
RDW: 12.3 % (ref 12.3–15.4)
WBC: 8.2 10*3/uL (ref 3.4–10.8)

## 2014-11-24 LAB — COMPREHENSIVE METABOLIC PANEL
ALT: 23 IU/L (ref 0–44)
AST: 27 IU/L (ref 0–40)
Albumin/Globulin Ratio: 2.1 (ref 1.1–2.5)
Albumin: 4.6 g/dL (ref 3.5–4.8)
Alkaline Phosphatase: 77 IU/L (ref 39–117)
BUN/Creatinine Ratio: 10 (ref 10–22)
BUN: 11 mg/dL (ref 8–27)
CO2: 28 mmol/L (ref 18–29)
Calcium: 9.8 mg/dL (ref 8.6–10.2)
Chloride: 100 mmol/L (ref 97–108)
Creatinine, Ser: 1.07 mg/dL (ref 0.76–1.27)
GFR calc Af Amer: 80 mL/min/{1.73_m2} (ref >59)
GFR calc non Af Amer: 69 mL/min/{1.73_m2} (ref >59)
Globulin, Total: 2.2 g/dL (ref 1.5–4.5)
Glucose: 96 mg/dL (ref 65–99)
Potassium: 4.7 mmol/L (ref 3.5–5.2)
Sodium: 142 mmol/L (ref 134–144)
Total Bilirubin: 0.9 mg/dL (ref 0.0–1.2)
Total Protein: 6.8 g/dL (ref 6.0–8.5)

## 2014-11-26 ENCOUNTER — Ambulatory Visit (INDEPENDENT_AMBULATORY_CARE_PROVIDER_SITE_OTHER): Payer: Medicare Other | Admitting: Internal Medicine

## 2014-11-26 ENCOUNTER — Encounter: Payer: Self-pay | Admitting: Internal Medicine

## 2014-11-26 VITALS — BP 140/78 | HR 63 | Temp 97.8°F | Resp 18 | Ht 65.0 in | Wt 143.6 lb

## 2014-11-26 DIAGNOSIS — Z23 Encounter for immunization: Secondary | ICD-10-CM

## 2014-11-26 DIAGNOSIS — H269 Unspecified cataract: Secondary | ICD-10-CM

## 2014-11-26 DIAGNOSIS — E1169 Type 2 diabetes mellitus with other specified complication: Secondary | ICD-10-CM

## 2014-11-26 DIAGNOSIS — Z0000 Encounter for general adult medical examination without abnormal findings: Secondary | ICD-10-CM

## 2014-11-26 DIAGNOSIS — E038 Other specified hypothyroidism: Secondary | ICD-10-CM

## 2014-11-26 DIAGNOSIS — H5340 Unspecified visual field defects: Secondary | ICD-10-CM

## 2014-11-26 DIAGNOSIS — E785 Hyperlipidemia, unspecified: Secondary | ICD-10-CM

## 2014-11-26 DIAGNOSIS — I10 Essential (primary) hypertension: Secondary | ICD-10-CM

## 2014-11-26 DIAGNOSIS — E1149 Type 2 diabetes mellitus with other diabetic neurological complication: Secondary | ICD-10-CM

## 2014-11-26 MED ORDER — PNEUMOCOCCAL 13-VAL CONJ VACC IM SUSP
0.5000 mL | Freq: Once | INTRAMUSCULAR | Status: AC
  Administered 2014-11-26: 0.5 mL via INTRAMUSCULAR

## 2014-11-26 NOTE — Progress Notes (Signed)
Patient ID: Tyler Hull, male   DOB: 1941/02/16, 73 y.o.   MRN: [id number]   [facility]  Code Status: advance directive completed and hcpoa is on chart   Allergies  Allergen Reactions  . Penicillins Swelling  . Shellfish Allergy Swelling    Chief Complaint  Patient presents with  . Annual Exam    HPI: Patient is a 73 y.o. black male seen in the office today for his annual exam.  Bad eye (with optical nerve damage) now has shade over eye that's worse since cataract removed. Using all the drops for that.    Still exercising (not this week, but usually).  Is going to [employer] lawn when gets home.  Says he's eating right, but apparently he's cheating when his wife doesn't cook.  He's been binging on pepsis--16 and 24 oz bottles.  Also ate some potatoes.    Said he had another cscope over the summer [place] [address] due to blood in stool.  Dr. Elnoria HowardHung.  Note in chart, but no procedure report.   Review of Systems:  Review of Systems  Constitutional: Negative for fever, chills and malaise/fatigue.  HENT: Negative for congestion.   Eyes: Positive for blurred vision.       See hpi; wears glasses, poor vision left eye  Respiratory: Negative for shortness of breath.   Cardiovascular: Negative for chest pain and leg swelling.  Gastrointestinal: Negative for abdominal pain, constipation, blood in stool and melena.  Genitourinary: Negative for dysuria.  Musculoskeletal: Negative for myalgias and falls.  Skin: Negative for rash.  Neurological: Positive for sensory change. Negative for dizziness, loss of consciousness and weakness.  Endo/Heme/Allergies: Does not bruise/bleed easily.  Psychiatric/Behavioral: Negative for depression and memory loss.     Past Medical History  Diagnosis Date  . Carpal tunnel syndrome   . Impaired fasting glucose   . Other abnormal blood chemistry   . Special screening for malignant neoplasm of prostate     . Backache, unspecified   . Abdominal pain, other specified site   . Impacted cerumen   . Unspecified hypothyroidism   . Essential hypertension, benign   . Unspecified essential hypertension   . Pain in joint, lower leg     Past Surgical History  Procedure Laterality Date  . (l) knee surgery"torn meniscus"    . Eye surgery Left 11/24/2014    cataracts    Social History:   reports that he has quit smoking. He does not have any smokeless tobacco history on file. He reports that he does not drink alcohol or use illicit drugs.  Family History  Problem Relation Age of Onset  . Alzheimer's disease Sister   . Cancer Brother 60    lung  . Diabetes Sister   . Diabetes Sister   . Kidney disease Sister   . Allergies Sister   . Obesity Sister     Medications: Patient's Medications  New Prescriptions   No medications on file  Previous Medications   ASPIRIN 81 MG TABLET    Take 81 mg by mouth daily.    ATORVASTATIN (LIPITOR) 40 MG TABLET    TAKE 1 TABLET DAILY TO LOWER CHOLESTEROL   BRIMONIDINE (ALPHAGAN) 0.2 % OPHTHALMIC SOLUTION    Place 2 drops into the left eye 2 (two) times daily.   CALCIUM CARBONATE-VITAMIN D (CALCIUM 600+D3) 600-400 MG-UNIT PER TABLET    Take 1 tablet by mouth daily.    DOCUSATE SODIUM (  COLACE) 100 MG CAPSULE    Take 1 capsule (100 mg total) by mouth 2 (two) times daily.   DORZOLAMIDE (TRUSOPT) 2 % OPHTHALMIC SOLUTION    Place 1 drop into the left eye 4 (four) times daily.   HYDROCHLOROTHIAZIDE (HYDRODIURIL) 25 MG TABLET    Take 37.5 mg by mouth daily.   HYDROCORTISONE (ANUSOL-HC) 25 MG SUPPOSITORY    Place 1 suppository (25 mg total) rectally 2 (two) times daily.   KETOROLAC (ACULAR) 0.5 % OPHTHALMIC SOLUTION    Place 1 drop into the left eye 4 (four) times daily.   LATANOPROST (XALATAN) 0.005 % OPHTHALMIC SOLUTION    Place 1 drop into both eyes at bedtime.   METFORMIN (GLUCOPHAGE) 500 MG TABLET    TAKE ONE-HALF (1/2) TABLET (250 MG TOTAL) TWICE A DAY WITH  MEALS   MULTIPLE VITAMINS-MINERALS (MULTIVITAMIN & MINERAL PO)    Take 1 tablet by mouth daily.    OFLOXACIN (OCUFLOX) 0.3 % OPHTHALMIC SOLUTION    Place 1 drop into the left eye 4 (four) times daily.   PREDNISOLONE ACETATE (PRED FORTE) 1 % OPHTHALMIC SUSPENSION    Place 1 drop into the left eye 4 (four) times daily.  Modified Medications   No medications on file  Discontinued Medications   ATORVASTATIN (LIPITOR) 40 MG TABLET    Take 40 mg by mouth daily.     Physical Exam: Filed Vitals:   11/26/14 1404  BP: 140/78  Pulse: 63  Temp: 97.8 F (36.6 C)  TempSrc: Oral  Resp: 18  Height: 5\' 5"  (1.651 m)  Weight: 143 lb 9.6 oz (65.137 kg)  SpO2: 98%   Physical Exam  Constitutional: He is oriented to person, place, and time. He appears well-developed and well-nourished. No distress.  HENT:  Head: Normocephalic and atraumatic.  Right Ear: External ear normal.  Left Ear: External ear normal.  Nose: Nose normal.  Mouth/Throat: Oropharynx is clear and moist. No oropharyngeal exudate.  Eyes: Conjunctivae and EOM are normal. Pupils are equal, round, and reactive to light.  Neck: Normal range of motion. Neck supple. No JVD present. No thyromegaly present.  Small superficial nodule behind left ear  Cardiovascular: Normal rate, regular rhythm, normal heart sounds and intact distal pulses.   Pulmonary/Chest: Effort normal and breath sounds normal. No respiratory distress.  Abdominal: Soft. Bowel sounds are normal. He exhibits no distension and no mass. There is no tenderness. No hernia.  Musculoskeletal: Normal range of motion. He exhibits no edema or tenderness.  Lymphadenopathy:    He has no cervical adenopathy.  Neurological: He is alert and oriented to person, place, and time. He has normal reflexes. He displays normal reflexes.  Skin: Skin is warm and dry.  Psychiatric: He has a normal mood and affect.     Labs reviewed: Basic Metabolic Panel:  Recent Labs  09/81/1911/12/01 0851  05/21/14 0900 06/05/14 0845 10/14/14 1046 11/23/14 1053  NA 142 140 140 139 142  K 4.1 4.0 3.7 4.1 4.7  CL 99 99 100 96* 100  CO2 31* 28 27 30* 28  GLUCOSE 96 98 106* 95 96  BUN 11 10 11 11 11   CREATININE 1.04 1.04 0.85 1.00 1.07  CALCIUM 10.1 9.4 9.3 9.9 9.8  TSH 2.690 4.500  --  3.830  --    Liver Function Tests:  Recent Labs  06/05/14 0845 10/14/14 1046 11/23/14 1053  AST 25 23 27   ALT 20 23 23   ALKPHOS 79 80 77  BILITOT 0.7 0.6 0.9  PROT 7.2 7.0 6.8  ALBUMIN 4.3  --   --    No results for input(s): LIPASE, AMYLASE in the last 8760 hours. No results for input(s): AMMONIA in the last 8760 hours. CBC:  Recent Labs  05/21/14 0900 06/05/14 0845 10/14/14 1046 11/23/14 1053  WBC 9.2 8.8 8.9 8.2  NEUTROABS 5.3  --  5.5 4.3  HGB 16.1 15.1 16.2 15.1  HCT 47.1 43.6 46.4 44.1  MCV 89 88.3 88 89  PLT  --  279 330  --    Lipid Panel:  Recent Labs  11/28/13 0851 05/21/14 0900 10/14/14 1046  HDL 54 50 53  LDLCALC 74 81 68  TRIG 111 92 90  CHOLHDL 2.8 3.0 2.6   Lab Results  Component Value Date   HGBA1C 5.9* 10/14/2014   Assessment/Plan 1. Routine general medical examination at a health care facility -given prevnar today -not depressed on phq9 -not falling -memory is good -hearing is selective per wife--did have tested -following closely with Dr. Elmer Picker about eyes -had foot exam in June -cscope was in July of this year when had rectal bleeding--Dr. 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 Elmer Picker  3. Bilateral cataracts -had left cataract removed (bad eye) -now to have right removed  4. Essential hypertension, benign -bp slightly above goal today--was all anxious and talkative  5. Other specified hypothyroidism -cont current regimen, f/u before next visit  6. DM (diabetes mellitus) type II controlled, neurological manifestation -f/u hba1c before next  visit -stop the bad pepsi habit which is returning   7. Hyperlipidemia associated with type 2 diabetes mellitus-cont statin therapy, f/u flp before next visit, cont diet and exercise  8. Need for vaccination with 13-polyvalent pneumococcal conjugate vaccine -prevnar 13 given  Labs/tests ordered:   Orders Placed This Encounter  Procedures  . CBC With differential/Platelet    Standing Status: Future     Number of Occurrences:      Standing Expiration Date: 05/28/2015  . Comprehensive metabolic panel    Standing Status: Future     Number of Occurrences:      Standing Expiration Date: 05/28/2015    Order Specific Question:  Has the patient fasted?    Answer:  Yes  . Lipid panel    Standing Status: Future     Number of Occurrences:      Standing Expiration Date: 05/28/2015    Order Specific Question:  Has the patient fasted?    Answer:  Yes  . Hemoglobin A1c    Standing Status: Future     Number of Occurrences:      Standing Expiration Date: 05/28/2015  . TSH    Standing Status: Future     Number of Occurrences:      Standing Expiration Date: 05/28/2015    Next appt:  3 mos with labs before  Shereda Graw L. Teddie Mehta, D.O. Geriatrics [facility] [address], [place] [address] Cell Phone (Mon-Fri 8am-5pm):  [phone] On Call:  [phone] & follow prompts after 5pm & weekends Office Phone:  [phone] Office Fax:  [phone]

## 2014-11-27 NOTE — Addendum Note (Signed)
Addended by: Lamont SnowballICE, Julliana Whitmyer L on: 11/27/2014 09:20 AM   Modules accepted: Orders

## 2014-12-03 ENCOUNTER — Other Ambulatory Visit: Payer: Self-pay | Admitting: *Deleted

## 2014-12-03 MED ORDER — METFORMIN HCL 500 MG PO TABS: ORAL_TABLET | ORAL | Status: DC

## 2014-12-03 NOTE — Telephone Encounter (Signed)
Patient requested to be faxed to [facility] 

## 2014-12-08 ENCOUNTER — Encounter: Payer: Self-pay | Admitting: *Deleted

## 2015-02-22 ENCOUNTER — Other Ambulatory Visit: Payer: Medicare Other

## 2015-02-25 ENCOUNTER — Ambulatory Visit: Payer: Medicare Other | Admitting: Internal Medicine

## 2015-03-02 ENCOUNTER — Other Ambulatory Visit: Payer: Medicare Other

## 2015-03-11 ENCOUNTER — Ambulatory Visit: Payer: Medicare Other | Admitting: Internal Medicine

## 2015-03-17 ENCOUNTER — Other Ambulatory Visit: Payer: Self-pay | Admitting: Internal Medicine

## 2015-03-19 ENCOUNTER — Other Ambulatory Visit: Payer: Self-pay

## 2015-03-23 ENCOUNTER — Other Ambulatory Visit: Payer: Medicare Other

## 2015-03-23 DIAGNOSIS — E785 Hyperlipidemia, unspecified: Secondary | ICD-10-CM

## 2015-03-23 DIAGNOSIS — E038 Other specified hypothyroidism: Secondary | ICD-10-CM

## 2015-03-23 DIAGNOSIS — E1169 Type 2 diabetes mellitus with other specified complication: Secondary | ICD-10-CM

## 2015-03-23 DIAGNOSIS — E1149 Type 2 diabetes mellitus with other diabetic neurological complication: Secondary | ICD-10-CM

## 2015-03-24 LAB — COMPREHENSIVE METABOLIC PANEL
ALT: 15 IU/L (ref 0–44)
AST: 17 IU/L (ref 0–40)
Albumin/Globulin Ratio: 1.9 (ref 1.1–2.5)
Albumin: 4.4 g/dL (ref 3.5–4.8)
Alkaline Phosphatase: 74 IU/L (ref 39–117)
BUN/Creatinine Ratio: 11 (ref 10–22)
BUN: 11 mg/dL (ref 8–27)
Bilirubin Total: 0.9 mg/dL (ref 0.0–1.2)
CO2: 26 mmol/L (ref 18–29)
Calcium: 9.4 mg/dL (ref 8.6–10.2)
Chloride: 100 mmol/L (ref 97–108)
Creatinine, Ser: 0.96 mg/dL (ref 0.76–1.27)
GFR calc Af Amer: 91 mL/min/{1.73_m2} (ref >59)
GFR calc non Af Amer: 79 mL/min/{1.73_m2} (ref >59)
Globulin, Total: 2.3 g/dL (ref 1.5–4.5)
Glucose: 98 mg/dL (ref 65–99)
Potassium: 3.6 mmol/L (ref 3.5–5.2)
Sodium: 142 mmol/L (ref 134–144)
Total Protein: 6.7 g/dL (ref 6.0–8.5)

## 2015-03-24 LAB — CBC WITH DIFFERENTIAL
Basophils Absolute: 0 10*3/uL (ref 0.0–0.2)
Basos: 1 %
Eos: 3 %
Eosinophils Absolute: 0.3 10*3/uL (ref 0.0–0.4)
HCT: 46 % (ref 37.5–51.0)
Hemoglobin: 15.6 g/dL (ref 12.6–17.7)
Immature Grans (Abs): 0 10*3/uL (ref 0.0–0.1)
Immature Granulocytes: 0 %
Lymphocytes Absolute: 2.8 10*3/uL (ref 0.7–3.1)
Lymphs: 32 %
MCH: 30.5 pg (ref 26.6–33.0)
MCHC: 33.9 g/dL (ref 31.5–35.7)
MCV: 90 fL (ref 79–97)
Monocytes Absolute: 0.7 10*3/uL (ref 0.1–0.9)
Monocytes: 8 %
Neutrophils Absolute: 5 10*3/uL (ref 1.4–7.0)
Neutrophils Relative %: 56 %
RBC: 5.12 x10E6/uL (ref 4.14–5.80)
RDW: 12.7 % (ref 12.3–15.4)
WBC: 8.8 10*3/uL (ref 3.4–10.8)

## 2015-03-24 LAB — LIPID PANEL
Chol/HDL Ratio: 2.6 ratio units (ref 0.0–5.0)
Cholesterol, Total: 127 mg/dL (ref 100–199)
HDL: 48 mg/dL (ref >39)
LDL Calculated: 60 mg/dL (ref 0–99)
Triglycerides: 94 mg/dL (ref 0–149)
VLDL Cholesterol Cal: 19 mg/dL (ref 5–40)

## 2015-03-24 LAB — HEMOGLOBIN A1C
Est. average glucose Bld gHb Est-mCnc: 123 mg/dL
Hgb A1c MFr Bld: 5.9 % — ABNORMAL HIGH (ref 4.8–5.6)

## 2015-03-24 LAB — TSH: TSH: 2.76 u[IU]/mL (ref 0.450–4.500)

## 2015-03-25 ENCOUNTER — Encounter: Payer: Self-pay | Admitting: Internal Medicine

## 2015-03-25 ENCOUNTER — Ambulatory Visit (INDEPENDENT_AMBULATORY_CARE_PROVIDER_SITE_OTHER): Payer: Medicare Other | Admitting: Internal Medicine

## 2015-03-25 VITALS — BP 128/62 | HR 68 | Temp 97.8°F | Resp 20 | Ht 65.0 in | Wt 144.0 lb

## 2015-03-25 DIAGNOSIS — N529 Male erectile dysfunction, unspecified: Secondary | ICD-10-CM

## 2015-03-25 DIAGNOSIS — I10 Essential (primary) hypertension: Secondary | ICD-10-CM

## 2015-03-25 DIAGNOSIS — E785 Hyperlipidemia, unspecified: Secondary | ICD-10-CM | POA: Diagnosis not present

## 2015-03-25 DIAGNOSIS — H269 Unspecified cataract: Secondary | ICD-10-CM

## 2015-03-25 DIAGNOSIS — E1149 Type 2 diabetes mellitus with other diabetic neurological complication: Secondary | ICD-10-CM | POA: Diagnosis not present

## 2015-03-25 DIAGNOSIS — H409 Unspecified glaucoma: Secondary | ICD-10-CM | POA: Diagnosis not present

## 2015-03-25 DIAGNOSIS — E1169 Type 2 diabetes mellitus with other specified complication: Secondary | ICD-10-CM

## 2015-03-25 DIAGNOSIS — E038 Other specified hypothyroidism: Secondary | ICD-10-CM

## 2015-03-25 MED ORDER — SILDENAFIL CITRATE 50 MG PO TABS: 50.0000 mg | ORAL_TABLET | Freq: Every day | ORAL | Status: DC | PRN

## 2015-03-25 NOTE — Progress Notes (Signed)
Patient ID: Tyler Hull, male   DOB: 23-Jun-1941, 74 y.o.   MRN: [id number]   Location:  [facility] / [place] [facility]  Code Status: full code Goals of Care: Advanced Directive information Does patient have an advance directive?: Yes, Type of Advance Directive: Healthcare Power of [place];Living will, Does patient want to make changes to advanced directive?: No - Patient declined   Allergies  Allergen Reactions  . Penicillins Swelling  . Shellfish Allergy Swelling    Chief Complaint  Patient presents with  . Medical Management of Chronic Issues    3 month follow-up, Discuss labs (copy printed)    HPI: Patient is a 74 y.o. seen in the office today for med mgt of chronic diseases.    Had taken train to [place] when his sister passed away.  Plans for [facility], [place] for high school grads, cruise.  [place]  Eating right.  Has been bad about drinking pepsis per [place].  I liking hawaiian rolls--eating 2-3 of them after supper some nights.    8 years ago when he left [place], they gave him cialis.  Had 4 sample packs.  He says she knew the difference during it, and it was ok.  Difficulty getting it to work or maintaining it.    Had cataract surgery--peripheral vision got much better.  Also had laser to reduce glaucoma pressure.  Sees Dr. Elmer Picker again next month.  She wants him to see a specialist about the magnification.     Review of Systems:  Review of Systems  Constitutional: Negative for fever and chills.  HENT: Negative for congestion.   Eyes: Negative for blurred vision and double vision.       Still with difficulty reading fine print  Respiratory: Negative for shortness of breath.   Cardiovascular: Negative for chest pain and leg swelling.  Gastrointestinal: Negative for abdominal pain and constipation.  Genitourinary: Negative for dysuria, urgency and frequency.       Erectile dysfunction  Musculoskeletal: Negative for  myalgias and falls.  Skin: Negative for rash.  Neurological: Negative for dizziness, loss of consciousness and weakness.  Endo/Heme/Allergies: Does not bruise/bleed easily.  Psychiatric/Behavioral: Negative for depression and memory loss.     Past Medical History  Diagnosis Date  . Carpal tunnel syndrome   . Impaired fasting glucose   . Other abnormal blood chemistry   . Special screening for malignant neoplasm of prostate   . Backache, unspecified   . Abdominal pain, other specified site   . Impacted cerumen   . Unspecified hypothyroidism   . Essential hypertension, benign   . Unspecified essential hypertension   . Pain in joint, lower leg     Past Surgical History  Procedure Laterality Date  . (l) knee surgery"torn meniscus"    . Eye surgery Left 11/24/2014    cataracts    Social History:   reports that he has quit smoking. He does not have any smokeless tobacco history on file. He reports that he does not drink alcohol or use illicit drugs.  Family History  Problem Relation Age of Onset  . Alzheimer's disease Sister   . Cancer Brother 60    lung  . Diabetes Sister   . Diabetes Sister   . Kidney disease Sister   . Allergies Sister   . Obesity Sister     Medications: Patient's Medications  New Prescriptions   No medications on file  Previous Medications   ASPIRIN  81 MG TABLET    Take 81 mg by mouth daily.    ATORVASTATIN (LIPITOR) 40 MG TABLET    TAKE 1 TABLET DAILY TO LOWER CHOLESTEROL   CALCIUM CARBONATE-VITAMIN D (CALCIUM 600+D3) 600-400 MG-UNIT PER TABLET    Take 1 tablet by mouth daily.    DORZOLAMIDE (TRUSOPT) 2 % OPHTHALMIC SOLUTION    Place 1 drop into the left eye 4 (four) times daily.   HYDROCHLOROTHIAZIDE (HYDRODIURIL) 25 MG TABLET    Take 37.5 mg by mouth daily.   LATANOPROST (XALATAN) 0.005 % OPHTHALMIC SOLUTION    Place 1 drop into both eyes at bedtime.   METFORMIN (GLUCOPHAGE) 500 MG TABLET    Take one tablet by mouth twice daily with meals to  control blood sugar   MULTIPLE VITAMINS-MINERALS (MULTIVITAMIN & MINERAL PO)    Take 1 tablet by mouth daily.   Modified Medications   No medications on file  Discontinued Medications   BRIMONIDINE (ALPHAGAN) 0.2 % OPHTHALMIC SOLUTION    Place 2 drops into the left eye 2 (two) times daily.   DOCUSATE SODIUM (COLACE) 100 MG CAPSULE    Take 1 capsule (100 mg total) by mouth 2 (two) times daily.   HYDROCORTISONE (ANUSOL-HC) 25 MG SUPPOSITORY    Place 1 suppository (25 mg total) rectally 2 (two) times daily.   KETOROLAC (ACULAR) 0.5 % OPHTHALMIC SOLUTION    Place 1 drop into the left eye 4 (four) times daily.   OFLOXACIN (OCUFLOX) 0.3 % OPHTHALMIC SOLUTION    Place 1 drop into the left eye 4 (four) times daily.   PREDNISOLONE ACETATE (PRED FORTE) 1 % OPHTHALMIC SUSPENSION    Place 1 drop into the left eye 4 (four) times daily.     Physical Exam: Filed Vitals:   03/25/15 1313  BP: 128/62  Pulse: 68  Temp: 97.8 F (36.6 C)  TempSrc: Oral  Resp: 20  Height:  (1.651 m)  Weight: 144 lb (65.318 kg)  SpO2: 98%  Physical Exam  Constitutional: He is oriented to person, place, and time. He appears well-developed and well-nourished. No distress.  Cardiovascular: Normal rate, regular rhythm, normal heart sounds and intact distal pulses.   Pulmonary/Chest: Effort normal and breath sounds normal.  Abdominal: Soft. Bowel sounds are normal.  Musculoskeletal: Normal range of motion.  Neurological: He is alert and oriented to person, place, and time.  Skin: Skin is warm and dry.  Psychiatric: He has a normal mood and affect.     Labs reviewed: Basic Metabolic Panel:  Recent Labs  45/40/98 0900  10/14/14 1046 11/23/14 1053 03/23/15 0937  NA 140  < > 139 142 142  K 4.0  < > 4.1 4.7 3.6  CL 99  < > 96* 100 100  CO2 28  < > 30* 28 26  GLUCOSE 98  < > 95 96 98  BUN 10  < > CREATININE 1.04  < > 1.00 1.07 0.96  CALCIUM 9.4  < > 9.9 9.8 9.4  TSH 4.500  --  3.830  --  2.760  <  > = values in this interval not displayed. Liver Function Tests:  Recent Labs  06/05/14 0845 10/14/14 1046 11/23/14 1053 03/23/15 0937  AST ALT ALKPHOS 79 80 77 74  BILITOT 0.7 0.6 0.9 0.9  PROT 7.2 7.0 6.8 6.7  ALBUMIN 4.3  --   --   --    No  results for input(s): LIPASE, AMYLASE in the last 8760 hours. No results for input(s): AMMONIA in the last 8760 hours. CBC:  Recent Labs  06/05/14 0845 10/14/14 1046 11/23/14 1053 03/23/15 0937  WBC 8.8 8.9 8.2 8.8  NEUTROABS  --  5.5 4.3 5.0  HGB 15.1 16.2 15.1 15.6  HCT 43.6 46.4 44.1 46.0  MCV 88.3 88 89 90  PLT 279 330  --   --    Lipid Panel:  Recent Labs  05/21/14 0900 10/14/14 1046 03/23/15 0937  CHOL 149 139 127  HDL 50 53 48  LDLCALC 81 68 60  TRIG 92 90 94  CHOLHDL 3.0 2.6 2.6   Lab Results  Component Value Date   HGBA1C 5.9* 03/23/2015   Assessment/Plan 1. DM (diabetes mellitus) type II controlled, neurological manifestation -well controlled--hba1c in prediabetic range--with diet, exercise, asa, statin, metformin, no lows, eats well  2. Essential hypertension, benign -bp at goal with hctz  3. Erectile dysfunction, unspecified erectile dysfunction type -give sample of 10 viagra 50mg  and Rx sent to pharmacy  4. Bilateral cataracts -s/p surgery--much improved  5. Glaucoma -had laser surgery for this with improvement  6. Other specified hypothyroidism -thyroid wnl last check  7. Hyperlipidemia associated with type 2 diabetes mellitus -lipids at goal with diet, exercise and lipitor  Labs/tests ordered: Orders Placed This Encounter  Procedures  . Hemoglobin A1c    Standing Status: Future     Number of Occurrences:      Standing Expiration Date: 11/24/2015  . Lipid panel    Standing Status: Future     Number of Occurrences:      Standing Expiration Date: 11/24/2015    Order Specific Question:  Has the patient fasted?    Answer:  Yes  . Basic metabolic panel     Standing Status: Future     Number of Occurrences:      Standing Expiration Date: 11/24/2015    Order Specific Question:  Has the patient fasted?    Answer:  Yes    Next appt:  4 mos with labs before  Zerah Hilyer L. Candiss Galeana, D.O. Geriatrics [facility] [address] [place], [place] [address] Cell Phone (Mon-Fri 8am-5pm):  [phone] On Call:  [phone] & follow prompts after 5pm & weekends Office Phone:  [phone] Office Fax:  [phone]

## 2015-07-26 ENCOUNTER — Other Ambulatory Visit: Payer: Medicare Other

## 2015-07-26 DIAGNOSIS — E1169 Type 2 diabetes mellitus with other specified complication: Secondary | ICD-10-CM

## 2015-07-26 DIAGNOSIS — E785 Hyperlipidemia, unspecified: Secondary | ICD-10-CM

## 2015-07-26 DIAGNOSIS — E1149 Type 2 diabetes mellitus with other diabetic neurological complication: Secondary | ICD-10-CM

## 2015-07-26 DIAGNOSIS — I10 Essential (primary) hypertension: Secondary | ICD-10-CM

## 2015-07-27 LAB — BASIC METABOLIC PANEL
BUN/Creatinine Ratio: 9 — ABNORMAL LOW (ref 10–22)
BUN: 9 mg/dL (ref 8–27)
CO2: 27 mmol/L (ref 18–29)
Calcium: 9.2 mg/dL (ref 8.6–10.2)
Chloride: 101 mmol/L (ref 97–108)
Creatinine, Ser: 0.99 mg/dL (ref 0.76–1.27)
GFR calc Af Amer: 88 mL/min/{1.73_m2} (ref >59)
GFR calc non Af Amer: 76 mL/min/{1.73_m2} (ref >59)
Glucose: 96 mg/dL (ref 65–99)
Potassium: 3.9 mmol/L (ref 3.5–5.2)
Sodium: 144 mmol/L (ref 134–144)

## 2015-07-27 LAB — HEMOGLOBIN A1C
Est. average glucose Bld gHb Est-mCnc: 126 mg/dL
Hgb A1c MFr Bld: 6 % — ABNORMAL HIGH (ref 4.8–5.6)

## 2015-07-27 LAB — LIPID PANEL
Chol/HDL Ratio: 2.9 ratio units (ref 0.0–5.0)
Cholesterol, Total: 141 mg/dL (ref 100–199)
HDL: 49 mg/dL (ref >39)
LDL Calculated: 69 mg/dL (ref 0–99)
Triglycerides: 117 mg/dL (ref 0–149)
VLDL Cholesterol Cal: 23 mg/dL (ref 5–40)

## 2015-07-29 ENCOUNTER — Encounter: Payer: Self-pay | Admitting: Internal Medicine

## 2015-07-29 ENCOUNTER — Ambulatory Visit (INDEPENDENT_AMBULATORY_CARE_PROVIDER_SITE_OTHER): Payer: Medicare Other | Admitting: Internal Medicine

## 2015-07-29 VITALS — BP 120/80 | HR 66 | Temp 97.4°F | Resp 20 | Ht 65.0 in | Wt 144.8 lb

## 2015-07-29 DIAGNOSIS — H409 Unspecified glaucoma: Secondary | ICD-10-CM

## 2015-07-29 DIAGNOSIS — S91112A Laceration without foreign body of left great toe without damage to nail, initial encounter: Secondary | ICD-10-CM

## 2015-07-29 DIAGNOSIS — E785 Hyperlipidemia, unspecified: Secondary | ICD-10-CM

## 2015-07-29 DIAGNOSIS — I10 Essential (primary) hypertension: Secondary | ICD-10-CM

## 2015-07-29 DIAGNOSIS — E1169 Type 2 diabetes mellitus with other specified complication: Secondary | ICD-10-CM | POA: Diagnosis not present

## 2015-07-29 DIAGNOSIS — Z87891 Personal history of nicotine dependence: Secondary | ICD-10-CM

## 2015-07-29 DIAGNOSIS — E1149 Type 2 diabetes mellitus with other diabetic neurological complication: Secondary | ICD-10-CM

## 2015-07-29 NOTE — Progress Notes (Signed)
Patient ID: Tyler Hull, male   DOB: 16-Aug-1941, 74 y.o.   MRN: [id number]   Location:  [facility] / [facility]  Code Status: DNR Goals of Care: Advanced Directive information Does patient have an advance directive?: Yes, Type of Advance Directive: Healthcare Power of [place];Living will;Out of facility DNR (pink MOST or yellow form), Pre-existing out of facility DNR order (yellow form or pink MOST form): Yellow form placed in chart (order not valid for inpatient use), Does patient want to make changes to advanced directive?: No - Patient declined   Chief Complaint  Patient presents with  . Medical Management of Chronic Issues  . Acute Visit    left big toe  check    HPI: Patient is a 74 y.o. black male seen in the office today for medical mgt of chronic diseases and acute concern for left big toe injury.    Left great toe bumped on dresser when the cat grabbed his other foot as he got out of the bed in the morning.  A layer or two of skin came off the medial aspect, but getting better now.    DMII:  He had a binge since end of May.  Had a fish fry at church, then been drinking juices and pepsi.  Was only drinking water with his pills.    Has f/u with Dr. Elmer Picker upcoming s/p lens implant--appt is 8/29.  Says he cannot see small print.  Didn't want to go to specialist. Discussed light magnifier.     Blood pressure is good with current regimen.  No dizziness.  Review of Systems:  Review of Systems  Constitutional: Negative for fever and chills.  HENT: Negative for hearing loss.   Eyes: Positive for blurred vision.       Glasses  Respiratory: Negative for shortness of breath.   Cardiovascular: Negative for chest pain.  Gastrointestinal: Negative for abdominal pain, constipation, blood in stool and melena.  Genitourinary: Negative for dysuria, urgency and frequency.  Musculoskeletal: Negative for falls.  Skin:       Left great toe abrasion    Neurological: Positive for tingling and sensory change. Negative for dizziness and loss of consciousness.  Endo/Heme/Allergies: Does not bruise/bleed easily.       Diabetes  Psychiatric/Behavioral: Negative for depression and memory loss.    Past Medical History  Diagnosis Date  . Carpal tunnel syndrome   . Impaired fasting glucose   . Other abnormal blood chemistry   . Special screening for malignant neoplasm of prostate   . Backache, unspecified   . Abdominal pain, other specified site   . Impacted cerumen   . Unspecified hypothyroidism   . Essential hypertension, benign   . Unspecified essential hypertension   . Pain in joint, lower leg     Past Surgical History  Procedure Laterality Date  . (l) knee surgery"torn meniscus"    . Eye surgery Left 11/24/2014    cataracts    Allergies  Allergen Reactions  . Penicillins Swelling  . Shellfish Allergy Swelling   Medications: Patient's Medications  New Prescriptions   No medications on file  Previous Medications   ASPIRIN 81 MG TABLET    Take 81 mg by mouth daily.    ATORVASTATIN (LIPITOR) 40 MG TABLET    TAKE 1 TABLET DAILY TO LOWER CHOLESTEROL   CALCIUM CARBONATE-VITAMIN D (CALCIUM 600+D3) 600-400 MG-UNIT PER TABLET    Take 1 tablet by mouth daily.    DORZOLAMIDE (TRUSOPT)  2 % OPHTHALMIC SOLUTION    Place 1 drop into the left eye 4 (four) times daily.   HYDROCHLOROTHIAZIDE (HYDRODIURIL) 25 MG TABLET    Take 37.5 mg by mouth daily.   LATANOPROST (XALATAN) 0.005 % OPHTHALMIC SOLUTION    Place 1 drop into both eyes at bedtime.   METFORMIN (GLUCOPHAGE) 500 MG TABLET    Take one tablet by mouth twice daily with meals to control blood sugar   MULTIPLE VITAMINS-MINERALS (MULTIVITAMIN & MINERAL PO)    Take 1 tablet by mouth daily.    SILDENAFIL (VIAGRA) 50 MG TABLET    Take 1 tablet (50 mg total) by mouth daily as needed for erectile dysfunction.  Modified Medications   No medications on file  Discontinued Medications   No  medications on file    Physical Exam: Filed Vitals:   07/29/15 1010  BP: 120/80  Pulse: 66  Temp: 97.4 F (36.3 C)  TempSrc: Oral  Resp: 20  Height: 5\' 5"  (1.651 m)  Weight: 144 lb 12.8 oz (65.681 kg)  SpO2: 95%   Physical Exam  Constitutional: He is oriented to person, place, and time. He appears well-developed and well-nourished. No distress.  Cardiovascular: Normal rate, regular rhythm, normal heart sounds and intact distal pulses.   Pulmonary/Chest: Effort normal and breath sounds normal. No respiratory distress.  Abdominal: Soft. Bowel sounds are normal. He exhibits no distension. There is no tenderness.  Musculoskeletal: Normal range of motion.  See diabetic foot exam in encounter  Neurological: He is alert and oriented to person, place, and time.  Skin: Skin is warm and dry.  Left great toe with small abrasion on medial aspect, no erythema, warmth or drainage    Labs reviewed: Basic Metabolic Panel:  Recent Labs  16/10/96 1046 11/23/14 1053 03/23/15 0937 07/26/15 0934  NA 139 142 142 144  K 4.1 4.7 3.6 3.9  CL 96* 100 100 101  CO2 30* 28 26 27   GLUCOSE 95 96 98 96  BUN 11 11 11 9   CREATININE 1.00 1.07 0.96 0.99  CALCIUM 9.9 9.8 9.4 9.2  TSH 3.830  --  2.760  --    Liver Function Tests:  Recent Labs  10/14/14 1046 11/23/14 1053 03/23/15 0937  AST 23 27 17   ALT 23 23 15   ALKPHOS 80 77 74  BILITOT 0.6 0.9 0.9  PROT 7.0 6.8 6.7   No results for input(s): LIPASE, AMYLASE in the last 8760 hours. No results for input(s): AMMONIA in the last 8760 hours. CBC:  Recent Labs  10/14/14 1046 11/23/14 1053 03/23/15 0937  WBC 8.9 8.2 8.8  NEUTROABS 5.5 4.3 5.0  HGB 16.2 15.1 15.6  HCT 46.4 44.1 46.0  MCV 88 89 90  PLT 330  --   --    Lipid Panel:  Recent Labs  10/14/14 1046 03/23/15 0937 07/26/15 0934  CHOL 139 127 141  HDL 53 48 49  LDLCALC 68 60 69  TRIG 90 94 117  CHOLHDL 2.6 2.6 2.9   Lab Results  Component Value Date   HGBA1C 6.0*  07/26/2015   Assessment/Plan 1. Laceration of great toe of left foot, initial encounter -has improved, nearly healed, cont to monitor and notify us if redness, warmth, drainage, fever or increased pain develop  2. DM (diabetes mellitus) type II controlled, neurological manifestation -is controlled, slight worsening of hba1c due to recent binge of juices primarily -will drink more water and less juice and will reassess before next visit  3.  Essential hypertension, benign -bp at goal with hctz only--cont this  4. History of tobacco abuse -referred for screening ultrasound for AAA  5. Hyperlipidemia associated with type 2 diabetes mellitus -lipids at goal with lipitor, continue this and dietary changes and exercise  6. Glaucoma -cont trusopt and xalatan drops, f/u with Dr. Elmer Picker as planned  Labs/tests ordered: Orders Placed This Encounter  Procedures  . [place] Screening AAA    Standing Status: Future     Number of Occurrences:      Standing Expiration Date: 09/27/2016    Order Specific Question:  Reason for Exam (SYMPTOM  OR DIAGNOSIS REQUIRED)    Answer:  history of smoking    Order Specific Question:  Preferred imaging location?    Answer:  [facility]  . Hemoglobin A1c    Standing Status: Future     Number of Occurrences:      Standing Expiration Date: 01/29/2016  . Lipid panel    Standing Status: Future     Number of Occurrences:      Standing Expiration Date: 01/29/2016    Order Specific Question:  Has the patient fasted?    Answer:  Yes  . Basic metabolic panel    Standing Status: Future     Number of Occurrences:      Standing Expiration Date: 01/29/2016    Order Specific Question:  Has the patient fasted?    Answer:  Yes    Next appt:  4 mos for Annual exam with labs before   Hassie Mandt L. Rayman Petrosian, D.O. Geriatrics [facility] [address] [place], [place] [address] Cell Phone (Mon-Fri 8am-5pm):  [phone] On Call:   [phone] & follow prompts after 5pm & weekends Office Phone:  [phone] Office Fax:  [phone]

## 2015-08-10 ENCOUNTER — Telehealth: Payer: Self-pay | Admitting: *Deleted

## 2015-08-10 DIAGNOSIS — M79675 Pain in left toe(s): Secondary | ICD-10-CM

## 2015-08-10 NOTE — Telephone Encounter (Signed)
He had scratched it getting out of bed before his last appt.   Ok, to xray left foot with other imaging tomorrow.   Is it red, swollen, tender, or draining?

## 2015-08-10 NOTE — Telephone Encounter (Signed)
No just hurts to the touch. Order placed for x-ray

## 2015-08-10 NOTE — Telephone Encounter (Signed)
Patient wants an x-ray on his Left Big Toe because it hurts and feels like something is wrong. Going to [facility] for some other testing and wants to have it all done at the same time. Please Advise.

## 2015-08-11 ENCOUNTER — Other Ambulatory Visit: Payer: Self-pay | Admitting: Internal Medicine

## 2015-08-11 ENCOUNTER — Ambulatory Visit
Admission: RE | Admit: 2015-08-11 | Discharge: 2015-08-11 | Disposition: A | Discharge status: Home / Self Care | Payer: Medicare Other | Source: Ambulatory Visit | Attending: Internal Medicine | Admitting: Internal Medicine

## 2015-08-11 DIAGNOSIS — Z87891 Personal history of nicotine dependence: Secondary | ICD-10-CM

## 2015-08-11 DIAGNOSIS — M79675 Pain in left toe(s): Secondary | ICD-10-CM

## 2015-08-16 ENCOUNTER — Encounter: Payer: Self-pay | Admitting: *Deleted

## 2015-08-16 LAB — HM DIABETES EYE EXAM

## 2015-08-17 ENCOUNTER — Encounter: Payer: Self-pay | Admitting: *Deleted

## 2015-09-11 ENCOUNTER — Other Ambulatory Visit: Payer: Self-pay | Admitting: Internal Medicine

## 2015-09-30 ENCOUNTER — Ambulatory Visit (INDEPENDENT_AMBULATORY_CARE_PROVIDER_SITE_OTHER): Payer: Medicare Other

## 2015-09-30 DIAGNOSIS — Z23 Encounter for immunization: Secondary | ICD-10-CM | POA: Diagnosis not present

## 2015-10-29 LAB — HM DIABETES EYE EXAM

## 2015-11-09 ENCOUNTER — Other Ambulatory Visit: Payer: Self-pay | Admitting: Internal Medicine

## 2015-12-30 ENCOUNTER — Other Ambulatory Visit: Payer: Medicare Other

## 2015-12-31 ENCOUNTER — Other Ambulatory Visit: Payer: Medicare Other

## 2015-12-31 DIAGNOSIS — E785 Hyperlipidemia, unspecified: Secondary | ICD-10-CM

## 2015-12-31 DIAGNOSIS — I10 Essential (primary) hypertension: Secondary | ICD-10-CM

## 2015-12-31 DIAGNOSIS — E1169 Type 2 diabetes mellitus with other specified complication: Secondary | ICD-10-CM

## 2015-12-31 DIAGNOSIS — E1149 Type 2 diabetes mellitus with other diabetic neurological complication: Secondary | ICD-10-CM

## 2016-01-01 LAB — BASIC METABOLIC PANEL
BUN/Creatinine Ratio: 11 (ref 10–22)
BUN: 11 mg/dL (ref 8–27)
CO2: 29 mmol/L (ref 18–29)
Calcium: 9.5 mg/dL (ref 8.6–10.2)
Chloride: 97 mmol/L (ref 96–106)
Creatinine, Ser: 1.02 mg/dL (ref 0.76–1.27)
GFR calc Af Amer: 84 mL/min/{1.73_m2} (ref >59)
GFR calc non Af Amer: 73 mL/min/{1.73_m2} (ref >59)
Glucose: 89 mg/dL (ref 65–99)
Potassium: 4 mmol/L (ref 3.5–5.2)
Sodium: 143 mmol/L (ref 134–144)

## 2016-01-01 LAB — LIPID PANEL
Chol/HDL Ratio: 2.8 ratio units (ref 0.0–5.0)
Cholesterol, Total: 132 mg/dL (ref 100–199)
HDL: 48 mg/dL (ref >39)
LDL Calculated: 69 mg/dL (ref 0–99)
Triglycerides: 76 mg/dL (ref 0–149)
VLDL Cholesterol Cal: 15 mg/dL (ref 5–40)

## 2016-01-01 LAB — HEMOGLOBIN A1C
Est. average glucose Bld gHb Est-mCnc: 126 mg/dL
Hgb A1c MFr Bld: 6 % — ABNORMAL HIGH (ref 4.8–5.6)

## 2016-01-03 ENCOUNTER — Encounter: Payer: Self-pay | Admitting: Internal Medicine

## 2016-01-03 ENCOUNTER — Ambulatory Visit (INDEPENDENT_AMBULATORY_CARE_PROVIDER_SITE_OTHER): Payer: Medicare Other | Admitting: Internal Medicine

## 2016-01-03 VITALS — BP 120/64 | HR 66 | Temp 98.3°F | Ht 65.5 in | Wt 143.0 lb

## 2016-01-03 DIAGNOSIS — E785 Hyperlipidemia, unspecified: Secondary | ICD-10-CM

## 2016-01-03 DIAGNOSIS — Z0000 Encounter for general adult medical examination without abnormal findings: Secondary | ICD-10-CM

## 2016-01-03 DIAGNOSIS — I10 Essential (primary) hypertension: Secondary | ICD-10-CM

## 2016-01-03 DIAGNOSIS — E1142 Type 2 diabetes mellitus with diabetic polyneuropathy: Secondary | ICD-10-CM | POA: Insufficient documentation

## 2016-01-03 DIAGNOSIS — H409 Unspecified glaucoma: Secondary | ICD-10-CM | POA: Diagnosis not present

## 2016-01-03 DIAGNOSIS — N400 Enlarged prostate without lower urinary tract symptoms: Secondary | ICD-10-CM | POA: Diagnosis not present

## 2016-01-03 DIAGNOSIS — E1169 Type 2 diabetes mellitus with other specified complication: Secondary | ICD-10-CM | POA: Diagnosis not present

## 2016-01-03 NOTE — Patient Instructions (Signed)
Will add PSA to labs if Dr. Haywood PaoHung's office did not do this.  Good luck Thursday!  We will sent your urine protein test off today.

## 2016-01-03 NOTE — Progress Notes (Signed)
Patient ID: Tyler Hull, male   DOB: 01-01-1941, 75 y.o.   MRN: [id number]   Location: [facility]  Provider: Gwenith Spitz. Renato Gails, D.O., C.M.D.  Code Status: DNR Goals of Care: Advanced Directive information Does patient have an advance directive?: Yes, Type of Advance Directive: Healthcare Power of [place];Living will  Chief Complaint  Patient presents with  . Annual Exam    Wellness exam   . Medical Management of Chronic Issues    blood pressure, thyroid, blood sugar. Here with wife  . MMSE    30/30 passed clock drawing    HPI: Patient is a 75 y.o. male seen in the office today for an annual wellness exam and med mgt of his chronic diseases.      Went to [employer] and strained putting water in the cart.  Next day when he went to the restroom, he was bleeding.  His wife was putting suppositories for him.  Better Saturday.  No BM Sunday.  GI did exam and then he had bleeding after that.  To have enemas and plans to have flex sig on Thursday.  Has enlarged prostate.  No abdominal pain.  No rectal pain.  BMs fine.  No hemorrhoids.  Had cscope in 2015 and had small polyp that was benign removed then.  At [facility].  No changes in energy levels, appetite good.  Down 2 lbs.  No diarrhea.  Has been very active in past 30 days.     Depression screen [facility] 2/9 01/03/2016 07/29/2015 11/26/2014 08/22/2013  Decreased Interest 0 0 0 0  Down, Depressed, Hopeless 0 0 0 0  PHQ - 2 Score 0 0 0 0    Fall Risk  01/03/2016 07/29/2015 03/25/2015 11/26/2014 05/25/2014  Falls in the past year? No No No No No   MMSE - Mini Mental State Exam 01/03/2016  Orientation to time 5  Orientation to Place 5  Registration 3  Attention/ Calculation 5  Recall 3  Language- name 2 objects 2  Language- repeat 1  Language- follow 3 step command 3  Language- read & follow direction 1  Write a sentence 1  Copy design 1  Total score 30  passed clock   Health Maintenance  Topic Date Due  . URINE  MICROALBUMIN  10/15/2015  . HEMOGLOBIN A1C  06/29/2016  . INFLUENZA VACCINE  07/18/2016  . FOOT EXAM  07/28/2016  . OPHTHALMOLOGY EXAM  10/28/2016  . TETANUS/TDAP  12/02/2017  . COLONOSCOPY  07/01/2024  . ZOSTAVAX  Completed  . PNA vac Low Risk Adult  Completed   Urinary incontinence?  No difficulty  Functional status? Functional Status Survey: Is the patient deaf or have difficulty hearing?: No Does the patient have difficulty seeing, even when wearing glasses/contacts?: No (follows with Dr. Elmer Picker) Does the patient have difficulty concentrating, remembering, or making decisions?: No Does the patient have difficulty walking or climbing stairs?: No Does the patient have difficulty dressing or bathing?: No Does the patient have difficulty doing errands alone such as visiting a doctor's office or shopping?: No  Ex[place]?  [place]  Does a lot of work for [employer].   Diet?  Tries to follow healthy diet.  What his wife cooks is healthy.  Eats some bad things on his own like cheese crackers with peanut butter.  Had [employer] battle over holiday.   Vision:  Follows with Dr. Olena Heckle in 11/16.  Got his bifocals in his glasses--can read again.  Finally went to [facility] low vision specialist.  Dellie Catholic again 3/15.   Hearing:  Hearing well.   Dentition:  Only has three of his own teeth and they're ok.  Has dentures, working fine.   Pain:  No pain.  Review of Systems:  Review of Systems  Constitutional: Negative for fever, chills, weight loss and malaise/fatigue.  HENT: Negative for congestion and hearing loss.   Eyes: Positive for blurred vision.       With new bifocals, he's actually able to read small print (required 30x)  Respiratory: Negative for cough and shortness of breath.   Cardiovascular: Negative for chest pain and leg swelling.  Gastrointestinal: Positive for blood in stool. Negative for heartburn, nausea, vomiting, abdominal pain, diarrhea, constipation and  melena.  Genitourinary: Negative for dysuria, urgency and frequency.  Musculoskeletal: Negative for myalgias and falls.  Skin: Negative for rash.  Neurological: Negative for dizziness, loss of consciousness, weakness and headaches.  Endo/Heme/Allergies: Does not bruise/bleed easily.  Psychiatric/Behavioral: Negative for depression and memory loss.    Past Medical History  Diagnosis Date  . Carpal tunnel syndrome   . Impaired fasting glucose   . Other abnormal blood chemistry   . Special screening for malignant neoplasm of prostate   . Backache, unspecified   . Abdominal pain, other specified site   . Impacted cerumen   . Unspecified hypothyroidism   . Essential hypertension, benign   . Unspecified essential hypertension   . Pain in joint, lower leg     Past Surgical History  Procedure Laterality Date  . (l) knee surgery"torn meniscus"    . Eye surgery Left 11/24/2014    cataracts    Allergies  Allergen Reactions  . Penicillins Swelling  . Shellfish Allergy Swelling      Medication List       This list is accurate as of: 01/03/16  2:29 PM.  Always use your most recent med list.               aspirin 81 MG tablet  Take 81 mg by mouth daily.     atorvastatin 40 MG tablet  Commonly known as:  LIPITOR  TAKE 1 TABLET DAILY TO LOWER CHOLESTEROL     CALCIUM 600+D3 600-400 MG-UNIT tablet  Generic drug:  Calcium Carbonate-Vitamin D  Take 1 tablet by mouth daily.     hydrochlorothiazide 25 MG tablet  Commonly known as:  HYDRODIURIL  Take 37.5 mg by mouth daily.     latanoprost 0.005 % ophthalmic solution  Commonly known as:  XALATAN  Place 1 drop into both eyes at bedtime.     metFORMIN 500 MG tablet  Commonly known as:  GLUCOPHAGE  TAKE 1 TABLET TWICE A DAY WITH MEALS TO CONTROL BLOOD SUGAR     MULTIVITAMIN & MINERAL PO  Take 1 tablet by mouth daily.     sildenafil 50 MG tablet  Commonly known as:  VIAGRA  Take 1 tablet (50 mg total) by mouth daily as  needed for erectile dysfunction.        Physical Exam: Filed Vitals:   01/03/16 1404  BP: 120/64  Pulse: 66  Temp: 98.3 F (36.8 C)  TempSrc: Oral  Height: 5' 5.5" (1.664 m)  Weight: 143 lb (64.864 kg)  SpO2: 95%   Body mass index is 23.43 kg/(m^2). Physical Exam  Constitutional: He is oriented to person, place, and time. He appears well-developed and well-nourished. No distress.  HENT:  Head: Normocephalic and atraumatic.  Right Ear: External ear normal.  Left Ear: External ear normal.  Nose: Nose normal.  Mouth/Throat: Oropharynx is clear and moist. No oropharyngeal exudate.  Eyes: Conjunctivae and EOM are normal. Pupils are equal, round, and reactive to light.  Neck: Normal range of motion. Neck supple. No JVD present. No thyromegaly present.  Cardiovascular: Normal rate, regular rhythm, normal heart sounds and intact distal pulses.   No murmur heard. Pulmonary/Chest: Effort normal and breath sounds normal. No respiratory distress.  Abdominal: Soft. He exhibits no distension and no mass. There is no tenderness. There is no rebound and no guarding.  Hyperactive bowel sounds  Genitourinary:  Deferred as just done this am by Dr. Elnoria HowardHung at [facility]  Musculoskeletal: Normal range of motion. He exhibits no edema or tenderness.  Lymphadenopathy:    He has no cervical adenopathy.  Neurological: He is alert and oriented to person, place, and time. He has normal reflexes. He displays normal reflexes. No cranial nerve deficit. He exhibits normal muscle tone. Coordination normal.  Skin: Skin is warm.  Psychiatric: He has a normal mood and affect.    Labs reviewed: Basic Metabolic Panel:  Recent Labs  16/09/9603/05/16 0937 07/26/15 0934 12/31/15 0940  NA 142 144 143  K 3.6 3.9 4.0  CL 100 101 97  CO2 26 27 29   GLUCOSE 98 96 89  BUN 11 9 11   CREATININE 0.96 0.99 1.02  CALCIUM 9.4 9.2 9.5  TSH 2.760  --   --    Liver Function Tests:  Recent Labs  03/23/15 0937  AST 17  ALT 15    ALKPHOS 74  BILITOT 0.9  PROT 6.7  ALBUMIN 4.4   No results for input(s): LIPASE, AMYLASE in the last 8760 hours. No results for input(s): AMMONIA in the last 8760 hours. CBC:  Recent Labs  03/23/15 0937  WBC 8.8  NEUTROABS 5.0  HGB 15.6  HCT 46.0  MCV 90   Lipid Panel:  Recent Labs  03/23/15 0937 07/26/15 0934 12/31/15 0940  CHOL 127 141 132  HDL 48 49 48  LDLCALC 60 69 69  TRIG 94 117 76  CHOLHDL 2.6 2.9 2.8   Lab Results  Component Value Date   HGBA1C 6.0* 12/31/2015    Procedures: EKG today:  NSR at 59 bpm, no acute ischemia, infarct  Assessment/Plan 1. Medicare annual wellness visit, subsequent - up to date on screening and vaccinations except urine microalbumin so done today - Microalbumin/Creatinine Ratio, Urine  2. Essential hypertension, benign - bp well controlled with current regimen and EKG was unremarkable - EKG 12-Lead  3. Controlled type 2 diabetes mellitus with diabetic polyneuropathy, without long-term current use of insulin (HCC) - well controlled with metformin, diet and exercise, cont these and is on recommended meds - Microalbumin/Creatinine Ratio, Urine  4. Hyperlipidemia associated with type 2 diabetes mellitus (HCC) -lipids under good control with statin therapy, cont same--due to his sugars and as they got better so did his lipids  5. Glaucoma -cont f/u with Dr. Elmer PickerHecker and [place] vision [place] in [place]  Labs/tests ordered:   Orders Placed This Encounter  Procedures  . Microalbumin/Creatinine Ratio, Urine  . EKG 12-Lead  add on PSA to prior labs if possible  Next appt:  2 wks to f/u on his rectal bleeding outcome from appt on thurs  Yael Angerer L. Jeryn Cerney, D.O. Geriatrics [facility] [address] [place], [place] [address] Cell Phone (Mon-Fri 8am-5pm):  [phone] On Call:  [phone] &  follow prompts after 5pm & weekends Office Phone:  [phone] Office Fax:   [phone]

## 2016-01-03 NOTE — Progress Notes (Signed)
Patient ID: Tyler LawrenceJoseph H Hull, male   DOB: 18-Jul-1941, 75 y.o.   MRN: [id number] MMSE 30/30 passed clock drawing

## 2016-01-04 LAB — CBC AND DIFFERENTIAL
HEMATOCRIT: 46 % (ref 41–53)
HEMOGLOBIN: 16.2 g/dL (ref 13.5–17.5)
NEUTROS ABS: 5 /uL
Platelets: 323 10*3/uL (ref 150–399)
WBC: 9.2 10*3/mL

## 2016-01-04 LAB — MICROALBUMIN / CREATININE URINE RATIO
Creatinine, Urine: 87.7 mg/dL
MICROALB/CREAT RATIO: 15.3 mg/g creat (ref 0.0–30.0)
Microalbumin, Urine: 13.4 ug/mL

## 2016-01-04 LAB — BASIC METABOLIC PANEL
BUN: 9 mg/dL (ref 4–21)
Creatinine: 1 mg/dL (ref 0.6–1.3)
Glucose: 88 mg/dL
POTASSIUM: 4.3 mmol/L (ref 3.4–5.3)
Sodium: 141 mmol/L (ref 137–147)

## 2016-01-06 LAB — HM SIGMOIDOSCOPY

## 2016-01-17 ENCOUNTER — Ambulatory Visit: Payer: Medicare Other | Admitting: Internal Medicine

## 2016-01-20 ENCOUNTER — Telehealth: Payer: Self-pay

## 2016-01-20 NOTE — Telephone Encounter (Signed)
Patient called requesting results of PSA. Patient said he had a sigmoid exam and was told that he needed to have a PSA level checked. Patient was told it would be added to some labs that he already had. Patient informed no results available, patient was scheduled for Monday to come in for lab visit.  Patient states he would like to know from Dr.Reed what happened in this situation.

## 2016-01-21 ENCOUNTER — Encounter: Payer: Self-pay | Admitting: *Deleted

## 2016-01-21 NOTE — Telephone Encounter (Signed)
Discussed with patient, patient states he will call Dr.Hung's office and request records

## 2016-01-21 NOTE — Telephone Encounter (Signed)
The lab could not be added here.  He is scheduled to come Monday for his PSA test.  What I don't know is if Dr. Elnoria Howard did this test when he had his sigmoidoscopy.  Those records have been requested twice, and I still don't have them.

## 2016-01-24 ENCOUNTER — Other Ambulatory Visit: Payer: Medicare Other

## 2016-01-24 DIAGNOSIS — N400 Enlarged prostate without lower urinary tract symptoms: Secondary | ICD-10-CM

## 2016-01-25 LAB — PSA: Prostate Specific Ag, Serum: 1.5 ng/mL (ref 0.0–4.0)

## 2016-03-07 ENCOUNTER — Other Ambulatory Visit: Payer: Self-pay | Admitting: Internal Medicine

## 2016-04-10 ENCOUNTER — Other Ambulatory Visit: Payer: Self-pay | Admitting: *Deleted

## 2016-04-10 MED ORDER — HYDROCHLOROTHIAZIDE 25 MG PO TABS: 37.5000 mg | ORAL_TABLET | Freq: Every day | ORAL | Status: DC

## 2016-04-10 NOTE — Telephone Encounter (Signed)
Patient requested to be faxed to [facility].

## 2016-05-01 ENCOUNTER — Ambulatory Visit (INDEPENDENT_AMBULATORY_CARE_PROVIDER_SITE_OTHER): Payer: Medicare Other | Admitting: Internal Medicine

## 2016-05-01 ENCOUNTER — Encounter: Payer: Self-pay | Admitting: Internal Medicine

## 2016-05-01 VITALS — BP 120/70 | HR 65 | Temp 98.1°F | Ht 66.0 in | Wt 143.0 lb

## 2016-05-01 DIAGNOSIS — E039 Hypothyroidism, unspecified: Secondary | ICD-10-CM | POA: Diagnosis not present

## 2016-05-01 DIAGNOSIS — E1142 Type 2 diabetes mellitus with diabetic polyneuropathy: Secondary | ICD-10-CM

## 2016-05-01 DIAGNOSIS — I10 Essential (primary) hypertension: Secondary | ICD-10-CM | POA: Diagnosis not present

## 2016-05-01 DIAGNOSIS — E785 Hyperlipidemia, unspecified: Secondary | ICD-10-CM

## 2016-05-01 DIAGNOSIS — N400 Enlarged prostate without lower urinary tract symptoms: Secondary | ICD-10-CM | POA: Diagnosis not present

## 2016-05-01 NOTE — Progress Notes (Signed)
[facility] Provider:  Roselyne Stalnaker L. Renato Gailseed, D.O., C.M.D.  Code Status: DNR Goals of Care:  Advanced Directives 05/01/2016  Does patient have an advance directive? Yes  Type of Estate agentAdvance Directive Healthcare Power of [place];Living will  Copy of advanced directive(s) in chart? Yes    Chief Complaint  Patient presents with  . Medical Management of Chronic Issues    follow-up    HPI: Patient is a 75 y.o. male seen today for medical management of chronic diseases.  Reports nothing new.    Has been drinking some little sodas over a week and a 1/2.  Also drinking water.    HTN:  At goal.  No dizziness.    Glaucoma:  Eyedrops making his eyelashes grow.  Can see much better after cataract surgery.  Has reading glasses now.  10x magnification [place] vision [place].    DMII:  Sugar average needs to be rechecked.  PSA was fine in Feb.  Hypothyroid:  Needs to be rechecked.  Past Medical History  Diagnosis Date  . Carpal tunnel syndrome   . Impaired fasting glucose   . Other abnormal blood chemistry   . Special screening for malignant neoplasm of prostate   . Backache, unspecified   . Abdominal pain, other specified site   . Impacted cerumen   . Unspecified hypothyroidism   . Essential hypertension, benign   . Unspecified essential hypertension   . Pain in joint, lower leg     Past Surgical History  Procedure Laterality Date  . (l) knee surgery"torn meniscus"    . Eye surgery Left 11/24/2014    cataracts    Allergies  Allergen Reactions  . Penicillins Swelling  . Shellfish Allergy Swelling      Medication List       This list is accurate as of: 05/01/16  3:13 PM.  Always use your most recent med list.               aspirin 81 MG tablet  Take 81 mg by mouth daily.     atorvastatin 40 MG tablet  Commonly known as:  LIPITOR  TAKE 1 TABLET DAILY TO LOWER CHOLESTEROL     CALCIUM 600+D3 600-400 MG-UNIT tablet  Generic drug:  Calcium  Carbonate-Vitamin D  Take 1 tablet by mouth daily.     hydrochlorothiazide 25 MG tablet  Commonly known as:  HYDRODIURIL  Take 1.5 tablets (37.5 mg total) by mouth daily.     latanoprost 0.005 % ophthalmic solution  Commonly known as:  XALATAN  Place 1 drop into both eyes at bedtime.     metFORMIN 500 MG tablet  Commonly known as:  GLUCOPHAGE  TAKE 1 TABLET TWICE A DAY WITH MEALS TO CONTROL BLOOD SUGAR     MULTIVITAMIN & MINERAL PO  Take 1 tablet by mouth daily.     sildenafil 50 MG tablet  Commonly known as:  VIAGRA  Take 1 tablet (50 mg total) by mouth daily as needed for erectile dysfunction.        Review of Systems:  Review of Systems  Constitutional: Negative for fever, chills, weight loss and malaise/fatigue.  HENT: Negative for congestion and hearing loss.   Eyes: Negative for blurred vision.       Doing much better now since got glasses for close vision  Respiratory: Negative for shortness of breath.   Cardiovascular: Negative for chest pain, palpitations and leg swelling.  Gastrointestinal: Negative for heartburn, nausea, vomiting,  abdominal pain, diarrhea, constipation, blood in stool and melena.  Genitourinary: Negative for dysuria, urgency and frequency.  Musculoskeletal: Negative for myalgias, joint pain and falls.  Skin: Negative for rash.  Neurological: Negative for dizziness, loss of consciousness and weakness.  Endo/Heme/Allergies: Does not bruise/bleed easily.  Psychiatric/Behavioral: Negative for depression and memory loss. The patient is not nervous/anxious and does not have insomnia.     Health Maintenance  Topic Date Due  . HEMOGLOBIN A1C  06/29/2016  . INFLUENZA VACCINE  07/18/2016  . FOOT EXAM  07/28/2016  . OPHTHALMOLOGY EXAM  10/28/2016  . URINE MICROALBUMIN  01/02/2017  . TETANUS/TDAP  12/02/2017  . COLON CANCER SCREENING 5 YEAR SIGMOIDOSCOPY  01/05/2021  . COLONOSCOPY  07/01/2024  . ZOSTAVAX  Completed  . PNA vac Low Risk Adult   Completed    Physical Exam: Filed Vitals:   05/01/16 1443  BP: 120/70  Pulse: 65  Temp: 98.1 F (36.7 C)  TempSrc: Oral  Height:  (1.676 m)  Weight: 143 lb (64.864 kg)  SpO2: 98%   Body mass index is 23.09 kg/(m^2). Physical Exam  Constitutional: He is oriented to person, place, and time. He appears well-developed and well-nourished. No distress.  Cardiovascular: Normal rate, regular rhythm, normal heart sounds and intact distal pulses.   Pulmonary/Chest: Effort normal and breath sounds normal. No respiratory distress.  Abdominal: Bowel sounds are normal. He exhibits no distension.  Musculoskeletal: Normal range of motion.  Neurological: He is alert and oriented to person, place, and time.  Skin: Skin is warm and dry.  Psychiatric: He has a normal mood and affect.    Labs reviewed: Basic Metabolic Panel:  Recent Labs  40/98/11 0934 12/31/15 0940 01/04/16  NA 144 143 141  K 3.9 4.0 4.3  CL 101 97  --   CO2 27 29  --   GLUCOSE 96 89  --   BUN CREATININE 0.99 1.02 1.0  CALCIUM 9.2 9.5  --    Liver Function Tests: No results for input(s): AST, ALT, ALKPHOS, BILITOT, PROT, ALBUMIN in the last 8760 hours. No results for input(s): LIPASE, AMYLASE in the last 8760 hours. No results for input(s): AMMONIA in the last 8760 hours. CBC:  Recent Labs  01/04/16  WBC 9.2  NEUTROABS 5  HGB 16.2  HCT 46  PLT 323   Lipid Panel:  Recent Labs  07/26/15 0934 12/31/15 0940  CHOL 141 132  HDL 49 48  LDLCALC 69 69  TRIG 117 76  CHOLHDL 2.9 2.8   Lab Results  Component Value Date   HGBA1C 6.0* 12/31/2015    Assessment/Plan 1. Controlled type 2 diabetes mellitus with diabetic polyneuropathy, without long-term current use of insulin (HCC) - does fairly well outside of his sodas - cont to work on diet and keep up with exercise program--cheats on vacation - Hemoglobin A1c; Future  2. Hypothyroidism, unspecified hypothyroidism type - cont to monitor;  subclinical - TSH; Future  3. Essential hypertension, benign - bp at goal, cont hctz, might d/c this and change to ace/arb in future due to guidelines - CBC with Differential/Platelet; Future - Comprehensive metabolic panel; Future  4. Hyperlipidemia - lipids at goal with lipitor, cont this and his diet and exercise program - CBC with Differential/Platelet; Future - Comprehensive metabolic panel; Future - Lipid panel; Future  5. Enlarged prostate -BPH--not on meds, no symptoms, psa in normal range, but requested check so done  Labs/tests ordered:   Orders Placed This  Encounter  Procedures  . Hemoglobin A1c    Standing Status: Future     Number of Occurrences:      Standing Expiration Date: 11/01/2016  . CBC with Differential/Platelet    Standing Status: Future     Number of Occurrences:      Standing Expiration Date: 11/01/2016  . Comprehensive metabolic panel    Standing Status: Future     Number of Occurrences:      Standing Expiration Date: 11/01/2016    Order Specific Question:  Has the patient fasted?    Answer:  Yes  . Lipid panel    Standing Status: Future     Number of Occurrences:      Standing Expiration Date: 11/01/2016    Order Specific Question:  Has the patient fasted?    Answer:  Yes  . TSH    Standing Status: Future     Number of Occurrences:      Standing Expiration Date: 11/01/2016   Next appt:  08/03/2016 with labs before  Maxamus Colao L. Bernhardt Riemenschneider, D.O. Geriatrics [facility] [address] [place], [place] [address] Cell Phone (Mon-Fri 8am-5pm):  [phone] On Call:  [phone] & follow prompts after 5pm & weekends Office Phone:  [phone] Office Fax:  [phone]

## 2016-06-05 ENCOUNTER — Other Ambulatory Visit: Payer: Self-pay | Admitting: Internal Medicine

## 2016-07-03 ENCOUNTER — Other Ambulatory Visit: Payer: Self-pay

## 2016-07-03 DIAGNOSIS — N529 Male erectile dysfunction, unspecified: Secondary | ICD-10-CM

## 2016-07-03 MED ORDER — SILDENAFIL CITRATE 50 MG PO TABS: 50.0000 mg | ORAL_TABLET | Freq: Every day | ORAL | Status: DC | PRN

## 2016-07-26 NOTE — Addendum Note (Signed)
Addended by: Chao Blazejewski C on: 07/26/2016 02:53 PM   Modules accepted: Orders  

## 2016-07-31 ENCOUNTER — Other Ambulatory Visit: Payer: Medicare Other

## 2016-07-31 DIAGNOSIS — E039 Hypothyroidism, unspecified: Secondary | ICD-10-CM

## 2016-07-31 DIAGNOSIS — I10 Essential (primary) hypertension: Secondary | ICD-10-CM

## 2016-07-31 DIAGNOSIS — E785 Hyperlipidemia, unspecified: Secondary | ICD-10-CM

## 2016-07-31 DIAGNOSIS — E1142 Type 2 diabetes mellitus with diabetic polyneuropathy: Secondary | ICD-10-CM

## 2016-08-01 ENCOUNTER — Encounter: Payer: Self-pay | Admitting: *Deleted

## 2016-08-01 LAB — CBC WITH DIFFERENTIAL/PLATELET
Basophils Absolute: 0 cells/uL (ref 0–200)
Basophils Relative: 0 %
Eosinophils Absolute: 104 cells/uL (ref 15–500)
Eosinophils Relative: 1 %
HCT: 47.9 % (ref 38.5–50.0)
Hemoglobin: 15.9 g/dL (ref 13.2–17.1)
Lymphs Abs: 2600 cells/uL (ref 850–3900)
MCH: 30.2 pg (ref 27.0–33.0)
MCHC: 33.2 g/dL (ref 32.0–36.0)
MCV: 90.9 fL (ref 80.0–100.0)
MPV: 10.1 fL (ref 7.5–12.5)
Monocytes Absolute: 624 cells/uL (ref 200–950)
Monocytes Relative: 6 %
Neutro Abs: 7072 cells/uL (ref 1500–7800)
Neutrophils Relative %: 68 %
Platelets: 280 10*3/uL (ref 140–400)
RBC: 5.27 MIL/uL (ref 4.20–5.80)
RDW: 13 % (ref 11.0–15.0)
WBC: 10.4 10*3/uL (ref 3.8–10.8)

## 2016-08-01 LAB — COMPREHENSIVE METABOLIC PANEL
ALT: 32 U/L (ref 9–46)
AST: 25 U/L (ref 10–35)
Albumin: 4.4 g/dL (ref 3.6–5.1)
Alkaline Phosphatase: 73 U/L (ref 40–115)
BUN: 12 mg/dL (ref 7–25)
CO2: 30 mmol/L (ref 20–31)
Calcium: 10.2 mg/dL (ref 8.6–10.3)
Chloride: 102 mmol/L (ref 98–110)
Creat: 0.87 mg/dL (ref 0.70–1.18)
Glucose, Bld: 94 mg/dL (ref 65–99)
Potassium: 4.3 mmol/L (ref 3.5–5.3)
Sodium: 141 mmol/L (ref 135–146)
Total Bilirubin: 1 mg/dL (ref 0.2–1.2)
Total Protein: 6.9 g/dL (ref 6.1–8.1)

## 2016-08-01 LAB — LIPID PANEL
Cholesterol: 163 mg/dL (ref 125–200)
HDL: 76 mg/dL (ref >=40)
LDL Cholesterol: 68 mg/dL (ref <130)
Total CHOL/HDL Ratio: 2.1 Ratio (ref <=5.0)
Triglycerides: 94 mg/dL (ref <150)
VLDL: 19 mg/dL (ref <30)

## 2016-08-01 LAB — TSH: TSH: 1.4 mIU/L (ref 0.40–4.50)

## 2016-08-01 LAB — HEMOGLOBIN A1C
Hgb A1c MFr Bld: 5.8 % — ABNORMAL HIGH (ref <5.7)
Mean Plasma Glucose: 120 mg/dL

## 2016-08-02 LAB — HM DIABETES EYE EXAM

## 2016-08-03 ENCOUNTER — Ambulatory Visit (INDEPENDENT_AMBULATORY_CARE_PROVIDER_SITE_OTHER): Payer: Medicare Other | Admitting: Internal Medicine

## 2016-08-03 ENCOUNTER — Encounter: Payer: Self-pay | Admitting: Internal Medicine

## 2016-08-03 VITALS — BP 132/70 | HR 81 | Temp 98.0°F | Ht 66.0 in | Wt 144.0 lb

## 2016-08-03 DIAGNOSIS — R208 Other disturbances of skin sensation: Secondary | ICD-10-CM | POA: Diagnosis not present

## 2016-08-03 DIAGNOSIS — E1142 Type 2 diabetes mellitus with diabetic polyneuropathy: Secondary | ICD-10-CM | POA: Diagnosis not present

## 2016-08-03 DIAGNOSIS — R209 Unspecified disturbances of skin sensation: Secondary | ICD-10-CM

## 2016-08-03 DIAGNOSIS — E039 Hypothyroidism, unspecified: Secondary | ICD-10-CM | POA: Diagnosis not present

## 2016-08-03 DIAGNOSIS — E785 Hyperlipidemia, unspecified: Secondary | ICD-10-CM | POA: Diagnosis not present

## 2016-08-03 DIAGNOSIS — Z23 Encounter for immunization: Secondary | ICD-10-CM | POA: Diagnosis not present

## 2016-08-03 DIAGNOSIS — I10 Essential (primary) hypertension: Secondary | ICD-10-CM | POA: Diagnosis not present

## 2016-08-03 DIAGNOSIS — G5612 Other lesions of median nerve, left upper limb: Secondary | ICD-10-CM | POA: Diagnosis not present

## 2016-08-03 NOTE — Progress Notes (Signed)
[facility] Provider:  Devonna Oboyle L. Renato Gailseed, D.O., C.M.D.  Code Status: DNR Goals of Care:  Advanced Directives 08/03/2016  Does patient have an advance directive? Yes  Type of Estate agentAdvance Directive Healthcare Power of CharloAttorney;Living will  Does patient want to make changes to advanced directive? -  Copy of advanced directive(s) in chart? Yes  Pre-existing out of facility DNR order (yellow form or pink MOST form) -     Chief Complaint  Patient presents with  . Medical Management of Chronic Issues    3 mth follow-up    HPI: Patient is a 75 y.o. male seen today for medical management of chronic diseases.    DMII:  Sugar avg improved to 5.8 from 6.    Eye pressure good and no retinopathy.    ABI done by housecalls:  Right foot 0.90 mild vs. 0.67 moderate.  Feet have been cold.  No numbness or pain.    Had a cortisone shot in his right shoulder by Dr. Ranell PatrickNorris after his shoulder flared up when he was pulling on the starter of the mower.    Cholesterol panel normal/at goal.  Hypothyroid:  tsh normal.    Past Medical History:  Diagnosis Date  . Abdominal pain, other specified site   . Backache, unspecified   . Carpal tunnel syndrome   . Essential hypertension, benign   . Impacted cerumen   . Impaired fasting glucose   . Other abnormal blood chemistry   . Pain in joint, lower leg   . Special screening for malignant neoplasm of prostate   . Unspecified essential hypertension   . Unspecified hypothyroidism     Past Surgical History:  Procedure Laterality Date  . (L) KNEE SURGERY"TORN MENISCUS"    . EYE SURGERY Left 11/24/2014   cataracts    Allergies  Allergen Reactions  . Penicillins Swelling  . Shellfish Allergy Swelling      Medication List       Accurate as of 08/03/16  1:27 PM. Always use your most recent med list.          aspirin 81 MG tablet Take 81 mg by mouth daily.   atorvastatin 40 MG tablet Commonly known as:  LIPITOR TAKE 1 TABLET DAILY  TO LOWER CHOLESTEROL   hydrochlorothiazide 25 MG tablet Commonly known as:  HYDRODIURIL Take 1.5 tablets (37.5 mg total) by mouth daily.   latanoprost 0.005 % ophthalmic solution Commonly known as:  XALATAN Place 1 drop into both eyes at bedtime.   metFORMIN 500 MG tablet Commonly known as:  GLUCOPHAGE TAKE 1 TABLET TWICE A DAY WITH MEALS TO CONTROL BLOOD SUGAR   MULTIVITAMIN & MINERAL PO Take 1 tablet by mouth daily.   sildenafil 50 MG tablet Commonly known as:  VIAGRA Take 1 tablet (50 mg total) by mouth daily as needed for erectile dysfunction.       Review of Systems:  Review of Systems  Constitutional: Negative for chills, fever, malaise/fatigue and weight loss.  HENT: Negative for congestion and hearing loss.   Eyes: Negative for blurred vision.       Has glaucoma, has special glasses to help him see due to his chronic visual problems, but not adherent with wearing them  Respiratory: Negative for cough and shortness of breath.   Cardiovascular: Negative for chest pain, palpitations and leg swelling.       Feet get very cold  Gastrointestinal: Negative for abdominal pain, blood in stool, constipation, diarrhea and melena.  Genitourinary: Negative for dysuria, frequency and urgency.  Musculoskeletal: Positive for joint pain and myalgias. Negative for falls.       Left forearm; right shoulder  Skin: Negative for itching and rash.  Neurological: Negative for dizziness, tingling, sensory change, loss of consciousness, weakness and headaches.  Endo/Heme/Allergies: Does not bruise/bleed easily.  Psychiatric/Behavioral: Negative for depression and memory loss. The patient does not have insomnia.     Health Maintenance  Topic Date Due  . INFLUENZA VACCINE  07/18/2016  . FOOT EXAM  07/28/2016  . URINE MICROALBUMIN  01/02/2017  . HEMOGLOBIN A1C  01/31/2017  . OPHTHALMOLOGY EXAM  08/02/2017  . TETANUS/TDAP  12/02/2017  . COLON CANCER SCREENING 5 YEAR SIGMOIDOSCOPY   01/05/2021  . COLONOSCOPY  07/01/2024  . ZOSTAVAX  Completed  . PNA vac Low Risk Adult  Completed    Physical Exam: Vitals:   08/03/16 1311  BP: 132/70  Pulse: 81  Temp: 98 F (36.7 C)  TempSrc: Oral  SpO2: 96%  Weight: 144 lb (65.3 kg)  Height: 5\' 6"  (1.676 m)   Body mass index is 23.24 kg/m. Physical Exam  Constitutional: He is oriented to person, place, and time. He appears well-developed and well-nourished. No distress.  Cardiovascular: Normal rate, regular rhythm, normal heart sounds and intact distal pulses.   Pulmonary/Chest: Effort normal and breath sounds normal. No respiratory distress.  Abdominal: Bowel sounds are normal.  Musculoskeletal: Normal range of motion. He exhibits tenderness.  Left forearm muscle tender during pronation  Neurological: He is alert and oriented to person, place, and time.  Skin: Skin is warm and dry. Capillary refill takes less than 2 seconds.  Psychiatric: He has a normal mood and affect.    Labs reviewed: Basic Metabolic Panel:  Recent Labs  16/10/96 0940 01/04/16 07/31/16 1356  NA 143 141 141  K 4.0 4.3 4.3  CL 97  --  102  CO2 29  --  30  GLUCOSE 89  --  94  BUN 11 9 12   CREATININE 1.02 1.0 0.87  CALCIUM 9.5  --  10.2  TSH  --   --  1.40   Liver Function Tests:  Recent Labs  07/31/16 1356  AST 25  ALT 32  ALKPHOS 73  BILITOT 1.0  PROT 6.9  ALBUMIN 4.4   No results for input(s): LIPASE, AMYLASE in the last 8760 hours. No results for input(s): AMMONIA in the last 8760 hours. CBC:  Recent Labs  01/04/16 07/31/16 1356  WBC 9.2 10.4  NEUTROABS 5 7,072  HGB 16.2 15.9  HCT 46 47.9  MCV  --  90.9  PLT 323 280   Lipid Panel:  Recent Labs  12/31/15 0940 07/31/16 1356  CHOL 132 163  HDL 48 76  LDLCALC 69 68  TRIG 76 94  CHOLHDL 2.8 2.1   Lab Results  Component Value Date   HGBA1C 5.8 (H) 07/31/2016    Assessment/Plan 1. 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 Controlled type 2 diabetes mellitus with diabetic polyneuropathy, without long-term current use of insulin (HCC) - foot exam done today wnl and good pulses on that and has no claudication or other symptoms besides cold feet -well controlled  DMII - does like his soda and probably wouldn't have been diabetic w/o it - Hemoglobin A1c; Future - Basic metabolic panel; Future  4. Hypothyroidism, unspecified hypothyroidism type -was subclinical, last tsh wnl  5. Essential hypertension, benign - bp at goal, cont same hctz  - Basic metabolic panel; Future  6. Hyperlipidemia -lipids at goal with lipitor  7. Need for prophylactic vaccination and inoculation against influenza -flu shot given   Labs/tests ordered:   Orders Placed This Encounter  Procedures  . Hemoglobin A1c    Standing Status:   Future    Standing Expiration Date:   11/03/2016  . Basic metabolic panel    Standing Status:   Future    Standing Expiration Date:   08/03/2017   Next appt:  3 mos with labs before   Asli Tokarski L. Sabrina Arriaga, D.O. Geriatrics [facility] [address] [place], [place] [address] Cell Phone (Mon-Fri 8am-5pm):  [phone] On Call:  [phone] & follow prompts after 5pm & weekends Office Phone:  [phone] Office Fax:  [phone]

## 2016-08-03 NOTE — Addendum Note (Signed)
Addended by: Maurice SmallBEATTY, SHUENEAKA C on: 08/03/2016 02:24 PM   Modules accepted: Orders

## 2016-08-05 ENCOUNTER — Other Ambulatory Visit: Payer: Self-pay | Admitting: Internal Medicine

## 2016-08-18 ENCOUNTER — Ambulatory Visit (HOSPITAL_COMMUNITY)
Admission: RE | Admit: 2016-08-18 | Discharge: 2016-08-18 | Disposition: A | Discharge status: Home / Self Care | Payer: Medicare Other | Source: Ambulatory Visit | Attending: Vascular Surgery | Admitting: Vascular Surgery

## 2016-08-18 DIAGNOSIS — R209 Unspecified disturbances of skin sensation: Secondary | ICD-10-CM

## 2016-08-18 DIAGNOSIS — R208 Other disturbances of skin sensation: Secondary | ICD-10-CM | POA: Diagnosis not present

## 2016-08-25 ENCOUNTER — Telehealth: Payer: Self-pay | Admitting: *Deleted

## 2016-08-25 NOTE — Telephone Encounter (Signed)
Left message on machine that results of his lower extremity arterial duplex evaluation was normal, leg circulation was normal. Asked pt to return my call to acknowledge that he received my call.

## 2016-09-03 ENCOUNTER — Other Ambulatory Visit: Payer: Self-pay | Admitting: Internal Medicine

## 2016-10-26 ENCOUNTER — Other Ambulatory Visit: Payer: Medicare Other

## 2016-10-26 DIAGNOSIS — I10 Essential (primary) hypertension: Secondary | ICD-10-CM

## 2016-10-26 DIAGNOSIS — E1142 Type 2 diabetes mellitus with diabetic polyneuropathy: Secondary | ICD-10-CM

## 2016-10-26 LAB — BASIC METABOLIC PANEL
BUN: 13 mg/dL (ref 7–25)
CO2: 29 mmol/L (ref 20–31)
Calcium: 9.8 mg/dL (ref 8.6–10.3)
Chloride: 101 mmol/L (ref 98–110)
Creat: 1.07 mg/dL (ref 0.70–1.18)
Glucose, Bld: 100 mg/dL — ABNORMAL HIGH (ref 65–99)
Potassium: 4.5 mmol/L (ref 3.5–5.3)
Sodium: 138 mmol/L (ref 135–146)

## 2016-10-26 LAB — HEMOGLOBIN A1C
Hgb A1c MFr Bld: 5.7 % — ABNORMAL HIGH (ref <5.7)
Mean Plasma Glucose: 117 mg/dL

## 2016-10-30 ENCOUNTER — Ambulatory Visit (INDEPENDENT_AMBULATORY_CARE_PROVIDER_SITE_OTHER): Payer: Medicare Other | Admitting: Internal Medicine

## 2016-10-30 ENCOUNTER — Encounter: Payer: Self-pay | Admitting: Internal Medicine

## 2016-10-30 VITALS — BP 138/70 | HR 68 | Temp 98.1°F | Wt 143.0 lb

## 2016-10-30 DIAGNOSIS — E1142 Type 2 diabetes mellitus with diabetic polyneuropathy: Secondary | ICD-10-CM

## 2016-10-30 DIAGNOSIS — H409 Unspecified glaucoma: Secondary | ICD-10-CM | POA: Diagnosis not present

## 2016-10-30 DIAGNOSIS — M19072 Primary osteoarthritis, left ankle and foot: Secondary | ICD-10-CM

## 2016-10-30 DIAGNOSIS — N400 Enlarged prostate without lower urinary tract symptoms: Secondary | ICD-10-CM

## 2016-10-30 DIAGNOSIS — E039 Hypothyroidism, unspecified: Secondary | ICD-10-CM

## 2016-10-30 DIAGNOSIS — I10 Essential (primary) hypertension: Secondary | ICD-10-CM

## 2016-10-30 NOTE — Progress Notes (Signed)
Location:  [facility] clinic Provider:  Lylliana Kitamura L. Renato Gails, D.O., C.M.D.  Code Status: DNR Goals of Care:  Advanced Directives 08/03/2016  Does patient have an advance directive? Yes  Type of Estate agent of [place];Living will  Does patient want to make changes to advanced directive? -  Copy of advanced directive(s) in chart? Yes  Pre-existing out of facility DNR order (yellow form or pink MOST form) -     Chief Complaint  Patient presents with  . Medical Management of Chronic Issues    HPI: Patient is a 75 y.o. male seen today for medical management of chronic diseases.    Wearing boot for his left foot bothered his leg.  He ended up taking it off b/c it hurt.  He's had no problem since.  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  No longer hurting when he pronates his foot.    [facility] has not been going.  He agrees to start back.  Sugar average down a little.  Fasting glucose that morning was elevated.  His wife reports he was having pepsi on a binge for the past few months.    Vision doing ok.  Staying on his drop regimen.    BP ok this am.    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   Past Medical History:  Diagnosis Date  . Abdominal pain, other specified site   . Backache, unspecified   . Carpal tunnel syndrome   . Essential hypertension, benign   . Impacted cerumen   . Impaired fasting glucose   . Other abnormal blood chemistry   . Pain in joint, lower leg   . Special screening for malignant neoplasm of prostate   . Unspecified essential hypertension   . Unspecified hypothyroidism     Past Surgical History:  Procedure Laterality Date  . (L) KNEE SURGERY"TORN MENISCUS"    . EYE SURGERY Left 11/24/2014   cataracts    Allergies  Allergen Reactions  . Penicillins Swelling  . Shellfish Allergy Swelling      Medication List       Accurate as of 10/30/16 11:52 AM. Always use your most recent  med list.          aspirin 81 MG tablet Take 81 mg by mouth daily.   atorvastatin 40 MG tablet Commonly known as:  LIPITOR TAKE 1 TABLET DAILY TO LOWER CHOLESTEROL   hydrochlorothiazide 25 MG tablet Commonly known as:  HYDRODIURIL Take 1.5 tablets (37.5 mg total) by mouth daily.   latanoprost 0.005 % ophthalmic solution Commonly known as:  XALATAN Place 1 drop into both eyes at bedtime.   metFORMIN 500 MG tablet Commonly known as:  GLUCOPHAGE TAKE 1 TABLET TWICE A DAY WITH MEALS TO CONTROL BLOOD SUGAR   MULTIVITAMIN & MINERAL PO Take 1 tablet by mouth daily.   sildenafil 50 MG tablet Commonly known as:  VIAGRA Take 1 tablet (50 mg total) by mouth daily as needed for erectile dysfunction.       Review of Systems:  Review of Systems  Constitutional: Negative for chills and fever.  HENT: Negative for congestion and hearing loss.   Eyes:       Vision doing better since using glaucoma drops and following with ophtho  Respiratory: Negative for cough and shortness of breath.   Cardiovascular: Negative for chest pain and leg swelling.  Gastrointestinal: Negative for abdominal  pain, blood in stool, constipation and melena.  Genitourinary: Positive for frequency. Negative for dysuria and urgency.  Musculoskeletal: Negative for falls.       Foot pain  Skin: Negative for itching and rash.  Neurological: Positive for tingling and sensory change. Negative for dizziness and loss of consciousness.  Endo/Heme/Allergies: Does not bruise/bleed easily.  Psychiatric/Behavioral: Negative for depression and memory loss.    Health Maintenance  Topic Date Due  . URINE MICROALBUMIN  01/02/2017  . HEMOGLOBIN A1C  04/25/2017  . OPHTHALMOLOGY EXAM  08/02/2017  . FOOT EXAM  08/03/2017  . TETANUS/TDAP  12/02/2017  . COLON CANCER SCREENING 5 YEAR SIGMOIDOSCOPY  01/05/2021  . COLONOSCOPY  07/01/2024  . INFLUENZA VACCINE  Completed  . ZOSTAVAX  Completed  . PNA vac Low Risk Adult   Completed    Physical Exam: Vitals:   10/30/16 1105  BP: 138/70  Pulse: 68  Temp: 98.1 F (36.7 C)  TempSrc: Oral  SpO2: 97%  Weight: 143 lb (64.9 kg)   Body mass index is 23.08 kg/m. Physical Exam  Constitutional: He is oriented to person, place, and time. He appears well-developed and well-nourished. No distress.  Cardiovascular: Normal rate, regular rhythm, normal heart sounds and intact distal pulses.   Pulmonary/Chest: Effort normal and breath sounds normal. No respiratory distress.  Abdominal: Bowel sounds are normal.  Musculoskeletal: Normal range of motion.  Neurological: He is alert and oriented to person, place, and time. Coordination normal.  Skin: Skin is warm and dry.  Psychiatric: He has a normal mood and affect.    Labs reviewed: Basic Metabolic Panel:  Recent Labs  16/09/9600/13/17 0940 01/04/16 07/31/16 1356 10/26/16 0932  NA 143 141 141 138  K 4.0 4.3 4.3 4.5  CL 97  --  102 101  CO2 29  --  30 29  GLUCOSE 89  --  94 100*  BUN 11 9 12 13   CREATININE 1.02 1.0 0.87 1.07  CALCIUM 9.5  --  10.2 9.8  TSH  --   --  1.40  --    Liver Function Tests:  Recent Labs  07/31/16 1356  AST 25  ALT 32  ALKPHOS 73  BILITOT 1.0  PROT 6.9  ALBUMIN 4.4   No results for input(s): LIPASE, AMYLASE in the last 8760 hours. No results for input(s): AMMONIA in the last 8760 hours. CBC:  Recent Labs  01/04/16 07/31/16 1356  WBC 9.2 10.4  NEUTROABS 5 7,072  HGB 16.2 15.9  HCT 46 47.9  MCV  --  90.9  PLT 323 280   Lipid Panel:  Recent Labs  12/31/15 0940 07/31/16 1356  CHOL 132 163  HDL 48 76  LDLCALC 69 68  TRIG 76 94  CHOLHDL 2.8 2.1   Lab Results  Component Value Date   HGBA1C 5.7 (H) 10/26/2016    Assessment/Plan 1. Controlled type 2 diabetes mellitus with diabetic polyneuropathy, without long-term current use of insulin (HCC) -control is gradually improving - cont same regimen, drink less pepsi, avoid sweet juices and drink more water -  Hemoglobin A1c; Future - Lipid panel; Future  2. Enlarged prostate -no changes  3. Essential hypertension, benign - bp at goal, no changes needed - CBC with Differential/Platelet; Future - COMPLETE METABOLIC PANEL WITH GFR; Future  4. Hypothyroidism, unspecified type -cont to monitor, has not required medication - TSH; Future  5. Glaucoma of both eyes, unspecified glaucoma type -improved control with drops  6. Arthritis of left foot -noted, he's  not willing to use the brace b/c it's made him uncomfortable, now reports the pain is gone  Labs/tests ordered:   Orders Placed This Encounter  Procedures  . Hemoglobin A1c    Standing Status:   Future    Standing Expiration Date:   04/29/2017  . CBC with Differential/Platelet    Standing Status:   Future    Standing Expiration Date:   04/29/2017  . COMPLETE METABOLIC PANEL WITH GFR    [facility]    Standing Status:   Future    Standing Expiration Date:   04/29/2017  . Lipid panel    Standing Status:   Future    Standing Expiration Date:   04/29/2017    Order Specific Question:   Has the patient fasted?    Answer:   Yes  . TSH    Standing Status:   Future    Standing Expiration Date:   04/29/2017   Next appt:  02/08/2017 for AWV and CPE    Siboney Requejo L. Janki Dike, D.O. Geriatrics [facility] [address] [place], [place] [address] Cell Phone (Mon-Fri 8am-5pm):  [phone] On Call:  [phone] & follow prompts after 5pm & weekends Office Phone:  [phone] Office Fax:  [phone]

## 2016-11-13 ENCOUNTER — Ambulatory Visit (INDEPENDENT_AMBULATORY_CARE_PROVIDER_SITE_OTHER): Payer: Medicare Other | Admitting: Internal Medicine

## 2016-11-13 ENCOUNTER — Encounter: Payer: Self-pay | Admitting: Internal Medicine

## 2016-11-13 VITALS — BP 138/70 | HR 72 | Temp 98.1°F | Wt 144.0 lb

## 2016-11-13 DIAGNOSIS — K121 Other forms of stomatitis: Secondary | ICD-10-CM | POA: Diagnosis not present

## 2016-11-13 DIAGNOSIS — S0990XA Unspecified injury of head, initial encounter: Secondary | ICD-10-CM

## 2016-11-13 DIAGNOSIS — S0003XA Contusion of scalp, initial encounter: Secondary | ICD-10-CM

## 2016-11-13 NOTE — Patient Instructions (Addendum)
Periogel for the ulceration in your left lower inner lip

## 2016-11-13 NOTE — Progress Notes (Signed)
[facility] Provider: Amiliana Foutz L. Renato Gailseed, D.O., C.M.D.  Code Status: DNR Goals of Care:  Advanced Directives 11/13/2016  Does Patient Have a Medical Advance Directive? Yes  Type of Advance Directive Living will  Does patient want to make changes to medical advance directive? -  Copy of Healthcare Power of Attorney in Chart? -  Pre-existing out of facility DNR order (yellow form or pink MOST form) -     Chief Complaint  Patient presents with  . Acute Visit    hit head 10/27/16 at church    HPI: Patient is a 75 y.o. male seen today for an acute visit for hitting his head on the heating duct at full speed walking at church on 11/10.  He had mentioned it at his regular visit, but it did not seem like it was that serious.  Now he returns c/o pain.  Pressure everyday in his left temple where he hit the head.  He had a small hematoma on his forehead.  If he laughs too hard, he starts to get a headache.  He is very concerned that something is wrong inside his head b/c his headaches since the injury have not resolved.  It's also very tender when he rests on that side.    Past Medical History:  Diagnosis Date  . Abdominal pain, other specified site   . Backache, unspecified   . Carpal tunnel syndrome   . Essential hypertension, benign   . Impacted cerumen   . Impaired fasting glucose   . Other abnormal blood chemistry   . Pain in joint, lower leg   . Special screening for malignant neoplasm of prostate   . Unspecified essential hypertension   . Unspecified hypothyroidism     Past Surgical History:  Procedure Laterality Date  . (L) KNEE SURGERY"TORN MENISCUS"    . EYE SURGERY Left 11/24/2014   cataracts    Allergies  Allergen Reactions  . Penicillins Swelling  . Shellfish Allergy Swelling      Medication List       Accurate as of 11/13/16  1:31 PM. Always use your most recent med list.          aspirin 81 MG tablet Take 81 mg by mouth daily.   atorvastatin  40 MG tablet Commonly known as:  LIPITOR TAKE 1 TABLET DAILY TO LOWER CHOLESTEROL   hydrochlorothiazide 25 MG tablet Commonly known as:  HYDRODIURIL Take 1.5 tablets (37.5 mg total) by mouth daily.   latanoprost 0.005 % ophthalmic solution Commonly known as:  XALATAN Place 1 drop into both eyes at bedtime.   metFORMIN 500 MG tablet Commonly known as:  GLUCOPHAGE TAKE 1 TABLET TWICE A DAY WITH MEALS TO CONTROL BLOOD SUGAR   MULTIVITAMIN & MINERAL PO Take 1 tablet by mouth daily.   sildenafil 50 MG tablet Commonly known as:  VIAGRA Take 1 tablet (50 mg total) by mouth daily as needed for erectile dysfunction.       Review of Systems:  Review of Systems  Constitutional: Negative for chills, fever and malaise/fatigue.  HENT: Negative for congestion, ear pain, hearing loss, sinus pain and sore throat.   Eyes: Positive for blurred vision.  Respiratory: Negative for shortness of breath.   Cardiovascular: Negative for chest pain and palpitations.  Gastrointestinal: Negative for abdominal pain.  Genitourinary: Negative for dysuria.  Musculoskeletal: Negative for falls.       Hit head hard on the heat duct in the ceiling at church 2  wks ago   Skin: Negative for itching and rash.  Neurological: Positive for headaches. Negative for dizziness, tingling, sensory change, speech change, focal weakness, loss of consciousness and weakness.  Psychiatric/Behavioral: Negative for depression.    Health Maintenance  Topic Date Due  . URINE MICROALBUMIN  01/02/2017  . HEMOGLOBIN A1C  04/25/2017  . OPHTHALMOLOGY EXAM  08/02/2017  . FOOT EXAM  08/03/2017  . TETANUS/TDAP  12/02/2017  . COLON CANCER SCREENING 5 YEAR SIGMOIDOSCOPY  01/05/2021  . COLONOSCOPY  07/01/2024  . INFLUENZA VACCINE  Completed  . ZOSTAVAX  Completed  . PNA vac Low Risk Adult  Completed    Physical Exam: Vitals:   11/13/16 1323  BP: 138/70  Pulse: 72  Temp: 98.1 F (36.7 C)  TempSrc: Oral  SpO2: 96%  Weight:  144 lb (65.3 kg)   Body mass index is 23.24 kg/m. Physical Exam  Constitutional: He is oriented to person, place, and time. He appears well-developed and well-nourished.  Sitting holding his head  HENT:  Slight swelling over left temple area  Eyes:  Difficulty seeing peripherally  Cardiovascular: Normal rate, regular rhythm and normal heart sounds.   Pulmonary/Chest: Effort normal and breath sounds normal. No respiratory distress.  Musculoskeletal: Normal range of motion.  Neurological: He is alert and oriented to person, place, and time. No cranial nerve deficit.  Skin: Skin is warm and dry.  Slight ecchymoses over left temple and forehead area  Psychiatric: He has a normal mood and affect.    Labs reviewed: Basic Metabolic Panel:  Recent Labs  16/10/96 0940 01/04/16 07/31/16 1356 10/26/16 0932  NA 143 141 141 138  K 4.0 4.3 4.3 4.5  CL 97  --  102 101  CO2 29  --  30 29  GLUCOSE 89  --  94 100*  BUN 11 9 12 13   CREATININE 1.02 1.0 0.87 1.07  CALCIUM 9.5  --  10.2 9.8  TSH  --   --  1.40  --    Liver Function Tests:  Recent Labs  07/31/16 1356  AST 25  ALT 32  ALKPHOS 73  BILITOT 1.0  PROT 6.9  ALBUMIN 4.4   No results for input(s): LIPASE, AMYLASE in the last 8760 hours. No results for input(s): AMMONIA in the last 8760 hours. CBC:  Recent Labs  01/04/16 07/31/16 1356  WBC 9.2 10.4  NEUTROABS 5 7,072  HGB 16.2 15.9  HCT 46 47.9  MCV  --  90.9  PLT 323 280   Lipid Panel:  Recent Labs  12/31/15 0940 07/31/16 1356  CHOL 132 163  HDL 48 76  LDLCALC 69 68  TRIG 76 94  CHOLHDL 2.8 2.1   Lab Results  Component Value Date   HGBA1C 5.7 (H) 10/26/2016    Assessment/Plan 1. Recent head trauma, initial encounter - struck head on heat duct in ceiling and has had headaches since -has some peripheral vision loss and blurred vision - CT HEAD WO CONTRAST; Future  2. Hematoma of frontal scalp, initial encounter - visible on left forehead - CT  HEAD WO CONTRAST; Future  3. Oral ulceration -advised to use a peroxide gel on the sore area where he believes the dentist struck his inner lip with a tool when cleaning his teeth  Labs/tests ordered:   Orders Placed This Encounter  Procedures  . CT HEAD WO CONTRAST    Standing Status:   Future    Standing Expiration Date:   02/13/2018  Order Specific Question:   Reason for Exam (SYMPTOM  OR DIAGNOSIS REQUIRED)    Answer:   left temple pain s/p striking head    Order Specific Question:   Preferred imaging location?    Answer:   [id number]   Next appt:  02/06/2017  Mihail Prettyman L. Avory Rahimi, D.O. Geriatrics [facility] [address] [place], [place] [address] Cell Phone (Mon-Fri 8am-5pm):  [phone] On Call:  [phone] & follow prompts after 5pm & weekends Office Phone:  [phone] Office Fax:  [phone]

## 2016-11-23 ENCOUNTER — Ambulatory Visit
Admission: RE | Admit: 2016-11-23 | Discharge: 2016-11-23 | Disposition: A | Discharge status: Home / Self Care | Payer: Medicare Other | Source: Ambulatory Visit | Attending: Internal Medicine | Admitting: Internal Medicine

## 2016-11-23 DIAGNOSIS — S0003XA Contusion of scalp, initial encounter: Secondary | ICD-10-CM

## 2016-11-23 DIAGNOSIS — S0990XA Unspecified injury of head, initial encounter: Secondary | ICD-10-CM

## 2016-11-24 ENCOUNTER — Encounter: Payer: Self-pay | Admitting: *Deleted

## 2016-12-03 ENCOUNTER — Other Ambulatory Visit: Payer: Self-pay | Admitting: Internal Medicine

## 2017-01-30 ENCOUNTER — Other Ambulatory Visit: Payer: Self-pay | Admitting: Orthopedic Surgery

## 2017-01-30 DIAGNOSIS — M25511 Pain in right shoulder: Secondary | ICD-10-CM

## 2017-02-03 ENCOUNTER — Other Ambulatory Visit: Payer: Self-pay | Admitting: Internal Medicine

## 2017-02-06 ENCOUNTER — Other Ambulatory Visit: Payer: Medicare Other

## 2017-02-06 DIAGNOSIS — E039 Hypothyroidism, unspecified: Secondary | ICD-10-CM

## 2017-02-06 DIAGNOSIS — E1142 Type 2 diabetes mellitus with diabetic polyneuropathy: Secondary | ICD-10-CM

## 2017-02-06 DIAGNOSIS — I10 Essential (primary) hypertension: Secondary | ICD-10-CM

## 2017-02-06 LAB — CBC WITH DIFFERENTIAL/PLATELET
Basophils Absolute: 0 cells/uL (ref 0–200)
Basophils Relative: 0 %
Eosinophils Absolute: 168 cells/uL (ref 15–500)
Eosinophils Relative: 2 %
HCT: 47 % (ref 38.5–50.0)
Hemoglobin: 15.6 g/dL (ref 13.2–17.1)
Lymphocytes Relative: 33 %
Lymphs Abs: 2772 cells/uL (ref 850–3900)
MCH: 29.9 pg (ref 27.0–33.0)
MCHC: 33.2 g/dL (ref 32.0–36.0)
MCV: 90 fL (ref 80.0–100.0)
MPV: 10.2 fL (ref 7.5–12.5)
Monocytes Absolute: 672 cells/uL (ref 200–950)
Monocytes Relative: 8 %
Neutro Abs: 4788 cells/uL (ref 1500–7800)
Neutrophils Relative %: 57 %
Platelets: 287 10*3/uL (ref 140–400)
RBC: 5.22 MIL/uL (ref 4.20–5.80)
RDW: 13 % (ref 11.0–15.0)
WBC: 8.4 10*3/uL (ref 3.8–10.8)

## 2017-02-06 LAB — LIPID PANEL
Cholesterol: 146 mg/dL (ref <200)
HDL: 50 mg/dL (ref >40)
LDL Cholesterol: 73 mg/dL (ref <100)
Total CHOL/HDL Ratio: 2.9 Ratio (ref <5.0)
Triglycerides: 114 mg/dL (ref <150)
VLDL: 23 mg/dL (ref <30)

## 2017-02-06 LAB — COMPLETE METABOLIC PANEL WITH GFR
ALT: 22 U/L (ref 9–46)
AST: 23 U/L (ref 10–35)
Albumin: 4.5 g/dL (ref 3.6–5.1)
Alkaline Phosphatase: 70 U/L (ref 40–115)
BUN: 11 mg/dL (ref 7–25)
CO2: 33 mmol/L — ABNORMAL HIGH (ref 20–31)
Calcium: 9.9 mg/dL (ref 8.6–10.3)
Chloride: 100 mmol/L (ref 98–110)
Creat: 1.07 mg/dL (ref 0.70–1.18)
GFR, Est African American: 79 mL/min (ref >=60)
GFR, Est Non African American: 68 mL/min (ref >=60)
Glucose, Bld: 97 mg/dL (ref 65–99)
Potassium: 4.2 mmol/L (ref 3.5–5.3)
Sodium: 141 mmol/L (ref 135–146)
Total Bilirubin: 0.9 mg/dL (ref 0.2–1.2)
Total Protein: 7.1 g/dL (ref 6.1–8.1)

## 2017-02-07 ENCOUNTER — Ambulatory Visit
Admission: RE | Admit: 2017-02-07 | Discharge: 2017-02-07 | Disposition: A | Discharge status: Home / Self Care | Payer: Medicare Other | Source: Ambulatory Visit | Attending: Orthopedic Surgery | Admitting: Orthopedic Surgery

## 2017-02-07 DIAGNOSIS — M25511 Pain in right shoulder: Secondary | ICD-10-CM

## 2017-02-07 LAB — HEMOGLOBIN A1C
Hgb A1c MFr Bld: 5.9 % — ABNORMAL HIGH (ref <5.7)
Mean Plasma Glucose: 123 mg/dL

## 2017-02-08 ENCOUNTER — Ambulatory Visit (INDEPENDENT_AMBULATORY_CARE_PROVIDER_SITE_OTHER): Payer: Medicare Other

## 2017-02-08 ENCOUNTER — Ambulatory Visit (INDEPENDENT_AMBULATORY_CARE_PROVIDER_SITE_OTHER): Payer: Medicare Other | Admitting: Internal Medicine

## 2017-02-08 ENCOUNTER — Encounter: Payer: Self-pay | Admitting: Internal Medicine

## 2017-02-08 VITALS — BP 142/78 | HR 67 | Temp 98.4°F | Ht 66.0 in | Wt 145.0 lb

## 2017-02-08 VITALS — BP 142/78 | HR 67 | Temp 98.4°F | Ht 66.0 in | Wt 145.6 lb

## 2017-02-08 DIAGNOSIS — E1169 Type 2 diabetes mellitus with other specified complication: Secondary | ICD-10-CM

## 2017-02-08 DIAGNOSIS — E039 Hypothyroidism, unspecified: Secondary | ICD-10-CM

## 2017-02-08 DIAGNOSIS — IMO0001 Reserved for inherently not codable concepts without codable children: Secondary | ICD-10-CM

## 2017-02-08 DIAGNOSIS — Z0000 Encounter for general adult medical examination without abnormal findings: Secondary | ICD-10-CM | POA: Diagnosis not present

## 2017-02-08 DIAGNOSIS — S46811A Strain of other muscles, fascia and tendons at shoulder and upper arm level, right arm, initial encounter: Secondary | ICD-10-CM

## 2017-02-08 DIAGNOSIS — E785 Hyperlipidemia, unspecified: Secondary | ICD-10-CM | POA: Diagnosis not present

## 2017-02-08 DIAGNOSIS — K649 Unspecified hemorrhoids: Secondary | ICD-10-CM | POA: Diagnosis not present

## 2017-02-08 DIAGNOSIS — E1142 Type 2 diabetes mellitus with diabetic polyneuropathy: Secondary | ICD-10-CM | POA: Diagnosis not present

## 2017-02-08 DIAGNOSIS — I10 Essential (primary) hypertension: Secondary | ICD-10-CM | POA: Diagnosis not present

## 2017-02-08 DIAGNOSIS — M75101 Unspecified rotator cuff tear or rupture of right shoulder, not specified as traumatic: Secondary | ICD-10-CM | POA: Insufficient documentation

## 2017-02-08 NOTE — Progress Notes (Signed)
Provider:  Gwenith Spitz. Renato Gails, D.O., C.M.D. Location:   [facility]   Place of Service:   clinic  Previous PCP: Bufford Spikes, DO Patient Care Team: Kermit Balo, DO as PCP - General (Geriatric Medicine) [name], MD as Consulting Physician (Ophthalmology) [name], MD as Consulting Physician (Gastroenterology)  Extended Emergency Contact Information Primary Emergency Contact: Brase,Juanita Address: [address]          [place], [place] [address] [place] Home Phone: [phone] Relation: None  Code Status: we do not have a DNR on file for him--need to clarify next visit Goals of Care: Advanced Directive information Advanced Directives 02/08/2017  Does Patient Have a Medical Advance Directive? Yes  Type of Estate agent of [place];Living will  Does patient want to make changes to medical advance directive? -  Copy of Healthcare Power of Attorney in Chart? Yes  Pre-existing out of facility DNR order (yellow form or pink MOST form) -   Chief Complaint  Patient presents with  . Annual Exam    CPE    HPI: Patient is a 76 y.o. male seen today for an annual physical exam.  Was doing injections in the right shoulder.  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  MRI shows tear in right supraspinatus, subacromial bursitis and moderate acromioclavicular OA.  He is to f/u with orthopedics.  [facility] and they were going to [employer] afterwards.  He's been back now for the past 3 weeks.  Sees Dr. Elmer Picker next month.  Past Medical History:  Diagnosis Date  . Abdominal pain, other specified site   . Backache, unspecified   . Carpal tunnel syndrome   . Essential hypertension, benign   . Impacted cerumen   . Impaired fasting glucose   . Other abnormal blood chemistry   . Pain in joint, lower leg   . Special screening for malignant neoplasm of prostate   . Unspecified essential  hypertension   . Unspecified hypothyroidism    Past Surgical History:  Procedure Laterality Date  . (L) KNEE SURGERY"TORN MENISCUS"    . EYE SURGERY Left 11/24/2014   cataracts    reports that he quit smoking about 51 years ago. He quit after 13.00 years of use. He has never used smokeless tobacco. He reports that he does not drink alcohol or use drugs.  Functional Status Survey:    Family History  Problem Relation Age of Onset  . Alzheimer's disease Sister   . Cancer Brother 60    lung  . Diabetes Sister   . Diabetes Sister   . Kidney disease Sister   . Allergies Sister   . Obesity Sister     Health Maintenance  Topic Date Due  . URINE MICROALBUMIN  01/02/2017  . OPHTHALMOLOGY EXAM  08/02/2017  . FOOT EXAM  08/03/2017  . HEMOGLOBIN A1C  08/06/2017  . TETANUS/TDAP  12/02/2017  . COLON CANCER SCREENING 5 YEAR SIGMOIDOSCOPY  01/05/2021  . COLONOSCOPY  07/01/2024  . INFLUENZA VACCINE  Completed  . PNA vac Low Risk Adult  Completed    Allergies  Allergen Reactions  . Penicillins Swelling  . Shellfish Allergy Swelling    Allergies as of 02/08/2017      Reactions   Penicillins Swelling   Shellfish Allergy Swelling      Medication List       Accurate as of 02/08/17  9:10  AM. Always use your most recent med list.          aspirin 81 MG tablet Take 81 mg by mouth daily.   atorvastatin 40 MG tablet Commonly known as:  LIPITOR TAKE 1 TABLET DAILY TO LOWER CHOLESTEROL   hydrochlorothiazide 25 MG tablet Commonly known as:  HYDRODIURIL Take 1.5 tablets (37.5 mg total) by mouth daily.   latanoprost 0.005 % ophthalmic solution Commonly known as:  XALATAN Place 1 drop into both eyes at bedtime.   metFORMIN 500 MG tablet Commonly known as:  GLUCOPHAGE Take 1 tablet in the morning and 1/2 in the evening   MULTIVITAMIN & MINERAL PO Take 1 tablet by mouth daily.   sildenafil 50 MG tablet Commonly known as:  VIAGRA Take 1 tablet (50 mg total) by mouth daily  as needed for erectile dysfunction.       Review of Systems  Constitutional: Negative for chills, fever and malaise/fatigue.  HENT: Negative for congestion and hearing loss.   Eyes: Negative for blurred vision.       Glaucoma, vision better s/p cataract surgery  Respiratory: Negative for cough and shortness of breath.   Cardiovascular: Negative for chest pain, palpitations and leg swelling.  Gastrointestinal: Positive for blood in stool. Negative for abdominal pain, constipation, diarrhea, heartburn and melena.       One episode of rectal bleeding that resolved on its own since one hemorrhoid was tied off  Genitourinary: Positive for frequency. Negative for dysuria, flank pain, hematuria and urgency.  Musculoskeletal: Positive for joint pain. Negative for back pain, falls, myalgias and neck pain.       Right shoulder with pain and decreased ROM  Skin: Negative for itching and rash.  Neurological: Negative for dizziness, loss of consciousness and weakness.  Endo/Heme/Allergies: Does not bruise/bleed easily.  Psychiatric/Behavioral: Negative for depression and memory loss. The patient is not nervous/anxious and does not have insomnia.     Vitals:   02/08/17 0845  BP: (!) 142/78  Pulse: 67  Temp: 98.4 F (36.9 C)  TempSrc: Oral  SpO2: 95%  Weight: 145 lb (65.8 kg)  Height: 5\' 6"  (1.676 m)   Body mass index is 23.4 kg/m. Physical Exam  Constitutional: He is oriented to person, place, and time. He appears well-developed and well-nourished. No distress.  HENT:  Head: Normocephalic and atraumatic.  Right Ear: External ear normal.  Left Ear: External ear normal.  Nose: Nose normal.  Mouth/Throat: Oropharynx is clear and moist. No oropharyngeal exudate.  Eyes: Conjunctivae and EOM are normal. Pupils are equal, round, and reactive to light.  Neck: Normal range of motion. Neck supple. No JVD present.  Cardiovascular: Normal rate, regular rhythm, normal heart sounds and intact  distal pulses.   Pulmonary/Chest: Effort normal and breath sounds normal. No respiratory distress.  Abdominal: Soft. Bowel sounds are normal.  Musculoskeletal: He exhibits tenderness. He exhibits no edema or deformity.  Decreased ROM of right shoulder (cannot elevate right arm about 90 on his own and has positive drop arm test on right), tenderness of joint   Lymphadenopathy:    He has no cervical adenopathy.  Neurological: He is alert and oriented to person, place, and time. He displays normal reflexes. No cranial nerve deficit or sensory deficit. He exhibits normal muscle tone.  Skin: Skin is warm and dry. Capillary refill takes less than 2 seconds.    Labs reviewed: Basic Metabolic Panel:  Recent Labs  16/10/96 1356 10/26/16 0932 02/06/17 1007  NA 141 138  141  K 4.3 4.5 4.2  CL 102 101 100  CO2 30 29 33*  GLUCOSE 94 100* 97  BUN 12 13 11   CREATININE 0.87 1.07 1.07  CALCIUM 10.2 9.8 9.9   Liver Function Tests:  Recent Labs  07/31/16 1356 02/06/17 1007  AST 25 23  ALT 32 22  ALKPHOS 73 70  BILITOT 1.0 0.9  PROT 6.9 7.1  ALBUMIN 4.4 4.5   No results for input(s): LIPASE, AMYLASE in the last 8760 hours. No results for input(s): AMMONIA in the last 8760 hours. CBC:  Recent Labs  07/31/16 1356 02/06/17 1007  WBC 10.4 8.4  NEUTROABS 7,072 4,788  HGB 15.9 15.6  HCT 47.9 47.0  MCV 90.9 90.0  PLT 280 287   Cardiac Enzymes: No results for input(s): CKTOTAL, CKMB, CKMBINDEX, TROPONINI in the last 8760 hours. BNP: Invalid input(s): POCBNP Lab Results  Component Value Date   HGBA1C 5.9 (H) 02/06/2017   Lab Results  Component Value Date   TSH 1.40 07/31/2016   Lab Results  Component Value Date   VITAMINB12 698 10/14/2014   No results found for: FOLATE Lab Results  Component Value Date   IRON 80 10/14/2014   TIBC 298 10/14/2014    Imaging and Procedures Recently: MRI right shoulder reviewed (see hpi)  EKG today with NSR at 64 bpm, unchanged from  01/03/16  Assessment/Plan 1. Annual physical exam -performed today, had wellness before with LPN  2. Essential hypertension, benign - bp not rechecked, was slightly elevated for him, typically at goal - EKG 12-Lead - Microalbumin/Creatinine Ratio, Urine - Basic metabolic panel; Future  3. Controlled type 2 diabetes mellitus with diabetic polyneuropathy, without long-term current use of insulin (HCC) - cont current therapy with metformin and diet and exercise - Microalbumin/Creatinine Ratio, Urine - Basic metabolic panel; Future - Hemoglobin A1c; Future  4. Hypothyroidism, unspecified type -cont current levothyroxine - TSH; Future  5. Hyperlipidemia associated with type 2 diabetes mellitus (HCC) - cont statin therapy - Lipid panel; Future  6. Tear of right supraspinatus tendon, initial encounter -new, recommended PT first, is to f/u with orthopedics -cont tylenol, ice  7. Hemorrhoids, unspecified hemorrhoid type -better since one was tied off by Dr. Elnoria HowardHung  Labs/tests ordered:   Orders Placed This Encounter  Procedures  . Microalbumin/Creatinine Ratio, Urine  . Basic metabolic panel    Standing Status:   Future    Standing Expiration Date:   10/08/2017    Order Specific Question:   Has the patient fasted?    Answer:   Yes  . Lipid panel    Standing Status:   Future    Standing Expiration Date:   10/08/2017    Order Specific Question:   Has the patient fasted?    Answer:   Yes  . Hemoglobin A1c    Standing Status:   Future    Standing Expiration Date:   10/08/2017  . TSH    Standing Status:   Future    Standing Expiration Date:   10/08/2017  . EKG 12-Lead     Demarie Uhlig L. Lively Haberman, D.O. Geriatrics [facility] [address] [place] [place], [place] [address] Cell Phone (Mon-Fri 8am-5pm):  [phone] On Call:  [phone] & follow prompts after 5pm & weekends Office Phone:  [phone] Office Fax:  [phone]

## 2017-02-08 NOTE — Patient Instructions (Addendum)
[name] , Thank you for taking time to come for your Medicare Wellness Visit. I appreciate your ongoing commitment to your health goals. Please review the following plan we discussed and let me know if I can assist you in the future.   These are the goals we discussed: Goals    . Increase water intake          Starting 02/08/17, I will attempt to increase my water intake from 2 bottles per day to 5 bottles per day.        This is a list of the screening recommended for you and due dates:  Health Maintenance  Topic Date Due  . Urine Protein Check  01/02/2017  . Eye exam for diabetics  08/02/2017  . Complete foot exam   08/03/2017  . Hemoglobin A1C  08/06/2017  . Tetanus Vaccine  12/02/2017  . Colon Cancer Screening  01/05/2021  . Colon Cancer Screening  07/01/2024  . Flu Shot  Completed  . Pneumonia vaccines  Completed  Preventive Care for Adults  A healthy lifestyle and preventive care can promote health and wellness. Preventive health guidelines for adults include the following key practices.  . A routine yearly physical is a good way to check with your health care provider about your health and preventive screening. It is a chance to share any concerns and updates on your health and to receive a thorough exam.  . Visit your dentist for a routine exam and preventive care every 6 months. Brush your teeth twice a day and floss once a day. Good oral hygiene prevents tooth decay and gum disease.  . The frequency of eye exams is based on your age, health, family medical history, use  of contact lenses, and other factors. Follow your health care provider's ecommendations for frequency of eye exams.  . Eat a healthy diet. Foods like vegetables, fruits, whole grains, low-fat dairy products, and lean protein foods contain the nutrients you need without too many calories. Decrease your intake of foods high in solid fats, added sugars, and salt. Eat the right amount of calories for you.  Get information about a proper diet from your health care provider, if necessary.  . Regular physical exercise is one of the most important things you can do for your health. Most adults should get at least 150 minutes of moderate-intensity exercise (any activity that increases your heart rate and causes you to sweat) each week. In addition, most adults need muscle-strengthening exercises on 2 or more days a week.  Silver Sneakers may be a benefit available to you. To determine eligibility, you may visit the website: www.silversneakers.com or contact program at [phone] Mon-Fri between 8AM-8PM.   . Maintain a healthy weight. The body mass index (BMI) is a screening tool to identify possible weight problems. It provides an estimate of body fat based on height and weight. Your health care provider can find your BMI and can help you achieve or maintain a healthy weight.   For adults 20 years and older: ? A BMI below 18.5 is considered underweight. ? A BMI of 18.5 to 24.9 is normal. ? A BMI of 25 to 29.9 is considered overweight. ? A BMI of 30 and above is considered obese.   . Maintain normal blood lipids and cholesterol levels by exercising and minimizing your intake of saturated fat. Eat a balanced diet with plenty of fruit and vegetables. Blood tests for lipids and cholesterol should begin at age 75 and be  repeated every 5 years. If your lipid or cholesterol levels are high, you are over 50, or you are at high risk for heart disease, you may need your cholesterol levels checked more frequently. Ongoing high lipid and cholesterol levels should be treated with medicines if diet and exercise are not working.  . If you smoke, find out from your health care provider how to quit. If you do not use tobacco, please do not start.  . If you choose to drink alcohol, please do not consume more than 2 drinks per day. One drink is considered to be 12 ounces (355 mL) of beer, 5 ounces (148 mL) of wine, or  1.5 ounces (44 mL) of liquor.  . If you are 38-62 years old, ask your health care provider if you should take aspirin to prevent strokes.  . Use sunscreen. Apply sunscreen liberally and repeatedly throughout the day. You should seek shade when your shadow is shorter than you. Protect yourself by wearing long sleeves, pants, a wide-brimmed hat, and sunglasses year round, whenever you are outdoors.  . Once a month, do a whole body skin exam, using a mirror to look at the skin on your back. Tell your health care provider of new moles, moles that have irregular borders, moles that are larger than a pencil eraser, or moles that have changed in shape or color.

## 2017-02-08 NOTE — Progress Notes (Signed)
   I reviewed health advisor's note, was available for consultation and agree with the assessment and plan as written.  I did review the MRI this morning with him.  He needs to f/u with surgery.  I recommended he do PT first.  Bari Handshoe L. Noelie Renfrow, D.O. Geriatrics [facility] [address] [place], [place] [address] Cell Phone (Mon-Fri 8am-5pm):  [phone] On Call:  [phone] & follow prompts after 5pm & weekends Office Phone:  [phone] Office Fax:  [phone]   Quick Notes   Health Maintenance:  None    Abnormal Screen: MMSE 30/30 failed clock test   Patient Concerns:  Pt had an MRI on his shoulder yesterday and was wondering if you had the results.     Nurse Concerns:  None

## 2017-02-08 NOTE — Progress Notes (Signed)
Subjective:   Tyler Hull is a 76 y.o. male who presents for Medicare Annual/Subsequent preventive examination.  Review of Systems:  Cardiac Risk Factors include: advanced age (>97men, >65 women);hypertension;male gender     Objective:    Vitals: BP (!) 142/78 (BP Location: Right Arm, Patient Position: Sitting, Cuff Size: Normal)   Pulse 67   Temp 98.4 F (36.9 C) (Oral)   Ht 5\' 6"  (1.676 m)   Wt 145 lb 9.6 oz (66 kg)   SpO2 95%   BMI 23.50 kg/m   Body mass index is 23.5 kg/m.  Tobacco History  Smoking Status  . Former Smoker  . Years: 13.00  . Quit date: 01/02/1966  Smokeless Tobacco  . Never Used    Comment: Quit at age 89      Counseling given: No   Past Medical History:  Diagnosis Date  . Abdominal pain, other specified site   . Backache, unspecified   . Carpal tunnel syndrome   . Essential hypertension, benign   . Impacted cerumen   . Impaired fasting glucose   . Other abnormal blood chemistry   . Pain in joint, lower leg   . Special screening for malignant neoplasm of prostate   . Unspecified essential hypertension   . Unspecified hypothyroidism    Past Surgical History:  Procedure Laterality Date  . (L) KNEE SURGERY"TORN MENISCUS"    . EYE SURGERY Left 11/24/2014   cataracts   Family History  Problem Relation Age of Onset  . Alzheimer's disease Sister   . Cancer Brother 60    lung  . Diabetes Sister   . Diabetes Sister   . Kidney disease Sister   . Allergies Sister   . Obesity Sister    History  Sexual Activity  . Sexual activity: Yes    Outpatient Encounter Prescriptions as of 02/08/2017  Medication Sig  . aspirin 81 MG tablet Take 81 mg by mouth daily.   [name] atorvastatin (LIPITOR) 40 MG tablet TAKE 1 TABLET DAILY TO LOWER CHOLESTEROL  . hydrochlorothiazide (HYDRODIURIL) 25 MG tablet Take 1.5 tablets (37.5 mg total) by mouth daily.  [name] latanoprost (XALATAN) 0.005 % ophthalmic solution Place 1 drop into both eyes at bedtime.  .  metFORMIN (GLUCOPHAGE) 500 MG tablet TAKE 1 TABLET TWICE A DAY WITH MEALS TO CONTROL BLOOD SUGAR (Patient taking differently: No sig reported)  . Multiple Vitamins-Minerals (MULTIVITAMIN & MINERAL PO) Take 1 tablet by mouth daily.   . sildenafil (VIAGRA) 50 MG tablet Take 1 tablet (50 mg total) by mouth daily as needed for erectile dysfunction.   No facility-administered encounter medications on file as of 02/08/2017.     Activities of Daily Living In your present state of health, do you have any difficulty performing the following activities: 02/08/2017  Hearing? Y  Vision? Y  Difficulty concentrating or making decisions? N  Walking or climbing stairs? Y  Dressing or bathing? N  Doing errands, shopping? N  Preparing Food and eating ? N  Using the Toilet? N  In the past six months, have you accidently leaked urine? N  Do you have problems with loss of bowel control? N  Managing your Medications? N  Managing your Finances? Y  Housekeeping or managing your Housekeeping? N  Some recent data might be hidden    Patient Care Team: Kermit Balo, DO as PCP - General (Geriatric Medicine) [name], MD as Consulting Physician (Ophthalmology) [name], MD as Consulting Physician (Gastroenterology)   Assessment:  Exercise Activities and Dietary recommendations Current Exercise Habits: Structured exercise class, Type of exercise: yoga;strength training/weights;stretching, Time (Minutes): 45, Frequency (Times/Week): 3, Weekly Exercise (Minutes/Week): 135, Intensity: Moderate  Goals    . Increase water intake          Starting 02/08/17, I will attempt to increase my water intake from 2 bottles per day to 5 bottles per day.       Fall Risk Fall Risk  02/08/2017 11/13/2016 10/30/2016 08/03/2016 05/01/2016  Falls in the past year? No No No No No   Depression Screen PHQ 2/9 Scores 02/08/2017 11/13/2016 10/30/2016 08/03/2016  PHQ - 2 Score 0 0 0 0    Cognitive Function MMSE - Mini  Mental State Exam 02/08/2017 01/03/2016  Orientation to time 5 5  Orientation to Place 5 5  Registration 3 3  Attention/ Calculation 5 5  Recall 3 3  Language- name 2 objects 2 2  Language- repeat 1 1  Language- follow 3 step command 3 3  Language- read & follow direction 1 1  Write a sentence 1 1  Copy design 1 1  Total score 30 30        Immunization History  Administered Date(s) Administered  . Influenza,inj,Quad PF,36+ Mos 08/31/2014, 09/30/2015, 08/03/2016  . Influenza-Unspecified 09/17/2012, 09/17/2013  . Pneumococcal Conjugate-13 11/26/2014, 11/27/2014  . Pneumococcal Polysaccharide-23 12/03/2007  . Td 12/03/2007  . Zoster 03/15/2013   Screening Tests Health Maintenance  Topic Date Due  . URINE MICROALBUMIN  01/02/2017  . OPHTHALMOLOGY EXAM  08/02/2017  . FOOT EXAM  08/03/2017  . HEMOGLOBIN A1C  08/06/2017  . TETANUS/TDAP  12/02/2017  . COLON CANCER SCREENING 5 YEAR SIGMOIDOSCOPY  01/05/2021  . COLONOSCOPY  07/01/2024  . INFLUENZA VACCINE  Completed  . PNA vac Low Risk Adult  Completed      Plan:    I have personally reviewed and addressed the Medicare Annual Wellness questionnaire and have noted the following in the patient's chart:  A. Medical and social history B. Use of alcohol, tobacco or illicit drugs  C. Current medications and supplements D. Functional ability and status E.  Nutritional status F.  Physical activity G. Advance directives H. List of other physicians I.  Hospitalizations, surgeries, and ER visits in previous 12 months J.  Vitals K. Screenings to include hearing, vision, cognitive, depression L. Referrals and appointments - none  In addition, I have reviewed and discussed with patient certain preventive protocols, quality metrics, and best practice recommendations. A written personalized care plan for preventive services as well as general preventive health recommendations were provided to patient.  See attached scanned questionnaire  for additional information.   Signed,   Nilda CalamityAlisa Lakyn Alsteen, LPN Health Advisor

## 2017-02-09 LAB — MICROALBUMIN / CREATININE URINE RATIO
Creatinine, Urine: 113 mg/dL (ref 20–370)
Microalb Creat Ratio: 17 mcg/mg creat (ref <30)
Microalb, Ur: 1.9 mg/dL

## 2017-03-18 ENCOUNTER — Other Ambulatory Visit: Payer: Self-pay | Admitting: Internal Medicine

## 2017-03-29 ENCOUNTER — Other Ambulatory Visit: Payer: Self-pay | Admitting: *Deleted

## 2017-03-29 DIAGNOSIS — N529 Male erectile dysfunction, unspecified: Secondary | ICD-10-CM

## 2017-03-29 MED ORDER — VIAGRA 50 MG PO TABS: 50.0000 mg | ORAL_TABLET | Freq: Every day | ORAL | 1 refills | Status: DC | PRN

## 2017-05-18 ENCOUNTER — Other Ambulatory Visit: Payer: Self-pay | Admitting: Internal Medicine

## 2017-05-18 DIAGNOSIS — N529 Male erectile dysfunction, unspecified: Secondary | ICD-10-CM

## 2017-05-18 DIAGNOSIS — Z23 Encounter for immunization: Secondary | ICD-10-CM

## 2017-05-18 MED ORDER — ZOSTER VAC RECOMB ADJUVANTED 50 MCG/0.5ML IM SUSR: 0.5000 mL | Freq: Once | INTRAMUSCULAR | 1 refills | Status: AC

## 2017-05-18 MED ORDER — VIAGRA 50 MG PO TABS: 50.0000 mg | ORAL_TABLET | Freq: Every day | ORAL | 1 refills | Status: DC | PRN

## 2017-06-01 ENCOUNTER — Other Ambulatory Visit: Payer: Self-pay | Admitting: Internal Medicine

## 2017-06-04 ENCOUNTER — Emergency Department (HOSPITAL_COMMUNITY)
Admission: EM | Admit: 2017-06-04 | Discharge: 2017-06-04 | Disposition: A | Discharge status: Home / Self Care | Payer: Medicare Other | Attending: Emergency Medicine | Admitting: Emergency Medicine

## 2017-06-04 ENCOUNTER — Ambulatory Visit (HOSPITAL_COMMUNITY)
Admission: EM | Admit: 2017-06-04 | Discharge: 2017-06-04 | Disposition: A | Discharge status: Nursing Home (Includes ICF) | Payer: Medicare Other | Attending: Family Medicine | Admitting: Family Medicine

## 2017-06-04 ENCOUNTER — Encounter (HOSPITAL_COMMUNITY): Payer: Self-pay

## 2017-06-04 ENCOUNTER — Emergency Department (HOSPITAL_COMMUNITY): Payer: Medicare Other

## 2017-06-04 ENCOUNTER — Encounter (HOSPITAL_COMMUNITY): Payer: Self-pay | Admitting: *Deleted

## 2017-06-04 DIAGNOSIS — Z7984 Long term (current) use of oral hypoglycemic drugs: Secondary | ICD-10-CM | POA: Insufficient documentation

## 2017-06-04 DIAGNOSIS — Z87891 Personal history of nicotine dependence: Secondary | ICD-10-CM | POA: Insufficient documentation

## 2017-06-04 DIAGNOSIS — E1149 Type 2 diabetes mellitus with other diabetic neurological complication: Secondary | ICD-10-CM | POA: Diagnosis not present

## 2017-06-04 DIAGNOSIS — Z7982 Long term (current) use of aspirin: Secondary | ICD-10-CM | POA: Diagnosis not present

## 2017-06-04 DIAGNOSIS — I10 Essential (primary) hypertension: Secondary | ICD-10-CM | POA: Insufficient documentation

## 2017-06-04 DIAGNOSIS — R109 Unspecified abdominal pain: Secondary | ICD-10-CM | POA: Diagnosis present

## 2017-06-04 DIAGNOSIS — R1084 Generalized abdominal pain: Secondary | ICD-10-CM | POA: Diagnosis not present

## 2017-06-04 DIAGNOSIS — E039 Hypothyroidism, unspecified: Secondary | ICD-10-CM | POA: Insufficient documentation

## 2017-06-04 DIAGNOSIS — K5900 Constipation, unspecified: Secondary | ICD-10-CM | POA: Diagnosis not present

## 2017-06-04 DIAGNOSIS — E1140 Type 2 diabetes mellitus with diabetic neuropathy, unspecified: Secondary | ICD-10-CM | POA: Insufficient documentation

## 2017-06-04 DIAGNOSIS — R1013 Epigastric pain: Secondary | ICD-10-CM

## 2017-06-04 DIAGNOSIS — Z79899 Other long term (current) drug therapy: Secondary | ICD-10-CM | POA: Diagnosis not present

## 2017-06-04 LAB — URINALYSIS, ROUTINE W REFLEX MICROSCOPIC
Bilirubin Urine: NEGATIVE
GLUCOSE, UA: NEGATIVE mg/dL
HGB URINE DIPSTICK: NEGATIVE
KETONES UR: NEGATIVE mg/dL
LEUKOCYTES UA: NEGATIVE
Nitrite: NEGATIVE
PH: 6 (ref 5.0–8.0)
Protein, ur: NEGATIVE mg/dL
Specific Gravity, Urine: 1.017 (ref 1.005–1.030)

## 2017-06-04 LAB — CBC
HEMATOCRIT: 49.2 % (ref 39.0–52.0)
Hemoglobin: 16.8 g/dL (ref 13.0–17.0)
MCH: 31.1 pg (ref 26.0–34.0)
MCHC: 34.1 g/dL (ref 30.0–36.0)
MCV: 91.1 fL (ref 78.0–100.0)
Platelets: 292 10*3/uL (ref 150–400)
RBC: 5.4 MIL/uL (ref 4.22–5.81)
RDW: 12.8 % (ref 11.5–15.5)
WBC: 10.5 10*3/uL (ref 4.0–10.5)

## 2017-06-04 LAB — COMPREHENSIVE METABOLIC PANEL
ALBUMIN: 5 g/dL (ref 3.5–5.0)
ALT: 29 U/L (ref 17–63)
AST: 30 U/L (ref 15–41)
Alkaline Phosphatase: 93 U/L (ref 38–126)
Anion gap: 9 (ref 5–15)
BUN: 5 mg/dL — AB (ref 6–20)
CHLORIDE: 100 mmol/L — AB (ref 101–111)
CO2: 30 mmol/L (ref 22–32)
CREATININE: 0.96 mg/dL (ref 0.61–1.24)
Calcium: 9.7 mg/dL (ref 8.9–10.3)
GFR calc Af Amer: >60 mL/min (ref >60)
GFR calc non Af Amer: >60 mL/min (ref >60)
GLUCOSE: 101 mg/dL — AB (ref 65–99)
POTASSIUM: 3.7 mmol/L (ref 3.5–5.1)
SODIUM: 139 mmol/L (ref 135–145)
Total Bilirubin: 0.8 mg/dL (ref 0.3–1.2)
Total Protein: 8 g/dL (ref 6.5–8.1)

## 2017-06-04 LAB — LIPASE, BLOOD: Lipase: 40 U/L (ref 11–51)

## 2017-06-04 LAB — I-STAT CG4 LACTIC ACID, ED: Lactic Acid, Venous: 1.46 mmol/L (ref 0.5–1.9)

## 2017-06-04 MED ORDER — IOPAMIDOL (ISOVUE-300) INJECTION 61%
INTRAVENOUS | Status: AC
  Administered 2017-06-04: 100 mL
  Filled 2017-06-04: qty 100
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
## 2017-06-04 NOTE — Discharge Instructions (Signed)
Please proceed directly to the Emergency Department for further evaluation. °
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
## 2017-06-04 NOTE — Discharge Instructions (Signed)
Get plenty of rest and drink a lot of fluids.  Eat a high-fiber diet.

## 2017-06-04 NOTE — ED Notes (Signed)
Patient transported to CT 
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
## 2017-06-11 ENCOUNTER — Other Ambulatory Visit: Payer: Medicare Other

## 2017-06-11 DIAGNOSIS — I10 Essential (primary) hypertension: Secondary | ICD-10-CM

## 2017-06-11 DIAGNOSIS — E785 Hyperlipidemia, unspecified: Secondary | ICD-10-CM

## 2017-06-11 DIAGNOSIS — E1169 Type 2 diabetes mellitus with other specified complication: Secondary | ICD-10-CM

## 2017-06-11 DIAGNOSIS — E039 Hypothyroidism, unspecified: Secondary | ICD-10-CM

## 2017-06-11 DIAGNOSIS — E1142 Type 2 diabetes mellitus with diabetic polyneuropathy: Secondary | ICD-10-CM

## 2017-06-11 LAB — BASIC METABOLIC PANEL WITH GFR
BUN: 6 mg/dL — ABNORMAL LOW (ref 7–25)
CO2: 31 mmol/L (ref 20–31)
Calcium: 9.4 mg/dL (ref 8.6–10.3)
Chloride: 100 mmol/L (ref 98–110)
Creat: 0.96 mg/dL (ref 0.70–1.18)
Glucose, Bld: 92 mg/dL (ref 65–99)
Potassium: 3.8 mmol/L (ref 3.5–5.3)
Sodium: 139 mmol/L (ref 135–146)

## 2017-06-11 LAB — LIPID PANEL
Cholesterol: 118 mg/dL (ref <200)
HDL: 43 mg/dL (ref >40)
LDL Cholesterol: 52 mg/dL (ref <100)
Total CHOL/HDL Ratio: 2.7 Ratio (ref <5.0)
Triglycerides: 115 mg/dL (ref <150)
VLDL: 23 mg/dL (ref <30)

## 2017-06-11 LAB — TSH: TSH: 1.49 mIU/L (ref 0.40–4.50)

## 2017-06-12 LAB — HEMOGLOBIN A1C
Hgb A1c MFr Bld: 5.8 % — ABNORMAL HIGH (ref <5.7)
Mean Plasma Glucose: 120 mg/dL

## 2017-06-14 ENCOUNTER — Encounter: Payer: Self-pay | Admitting: Internal Medicine

## 2017-06-14 ENCOUNTER — Ambulatory Visit (INDEPENDENT_AMBULATORY_CARE_PROVIDER_SITE_OTHER): Payer: Medicare Other | Admitting: Internal Medicine

## 2017-06-14 VITALS — BP 138/70 | HR 67 | Ht 66.0 in | Wt 138.0 lb

## 2017-06-14 DIAGNOSIS — N529 Male erectile dysfunction, unspecified: Secondary | ICD-10-CM

## 2017-06-14 DIAGNOSIS — N400 Enlarged prostate without lower urinary tract symptoms: Secondary | ICD-10-CM

## 2017-06-14 DIAGNOSIS — E1169 Type 2 diabetes mellitus with other specified complication: Secondary | ICD-10-CM

## 2017-06-14 DIAGNOSIS — R1033 Periumbilical pain: Secondary | ICD-10-CM

## 2017-06-14 DIAGNOSIS — I10 Essential (primary) hypertension: Secondary | ICD-10-CM | POA: Diagnosis not present

## 2017-06-14 DIAGNOSIS — E1142 Type 2 diabetes mellitus with diabetic polyneuropathy: Secondary | ICD-10-CM | POA: Diagnosis not present

## 2017-06-14 DIAGNOSIS — E785 Hyperlipidemia, unspecified: Secondary | ICD-10-CM | POA: Diagnosis not present

## 2017-06-14 DIAGNOSIS — I700 Atherosclerosis of aorta: Secondary | ICD-10-CM | POA: Insufficient documentation

## 2017-06-14 MED ORDER — VIAGRA 50 MG PO TABS: 50.0000 mg | ORAL_TABLET | Freq: Every day | ORAL | 1 refills | Status: DC | PRN
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
## 2017-08-02 ENCOUNTER — Other Ambulatory Visit: Payer: Self-pay | Admitting: Internal Medicine

## 2017-08-02 MED ORDER — METFORMIN HCL 500 MG PO TABS: ORAL_TABLET | ORAL | 6 refills | Status: DC

## 2017-08-02 NOTE — Telephone Encounter (Signed)
Rx for metformin was received from pharmacy but incorrect directions were on that request. A Rx was sent to pharmacy electronically with the correct instructions.

## 2017-08-06 ENCOUNTER — Telehealth: Payer: Self-pay | Admitting: *Deleted

## 2017-08-06 NOTE — Telephone Encounter (Signed)
Patient called and was wondering if you took him off of the Metformin. Reviewed last OV note and labs and nothing documented that it was discontinued. Patient wants to confirm with you and if you want him to continue needs Rx faxed to [facility]. Please Advise.

## 2017-08-06 NOTE — Telephone Encounter (Signed)
No,I haven't. 

## 2017-08-07 MED ORDER — METFORMIN HCL 500 MG PO TABS: ORAL_TABLET | ORAL | 1 refills | Status: DC

## 2017-08-07 NOTE — Telephone Encounter (Signed)
RX sent, patient aware  

## 2017-08-15 ENCOUNTER — Encounter: Payer: Self-pay | Admitting: *Deleted

## 2017-08-15 LAB — HM DIABETES EYE EXAM

## 2017-08-30 ENCOUNTER — Ambulatory Visit (INDEPENDENT_AMBULATORY_CARE_PROVIDER_SITE_OTHER): Payer: Medicare Other

## 2017-08-30 DIAGNOSIS — Z23 Encounter for immunization: Secondary | ICD-10-CM

## 2017-10-16 ENCOUNTER — Other Ambulatory Visit: Payer: Medicare Other

## 2017-10-18 ENCOUNTER — Ambulatory Visit: Payer: Medicare Other | Admitting: Internal Medicine

## 2017-11-28 ENCOUNTER — Other Ambulatory Visit: Payer: Self-pay | Admitting: Internal Medicine

## 2017-12-13 ENCOUNTER — Other Ambulatory Visit: Payer: Medicare Other

## 2017-12-13 DIAGNOSIS — E1142 Type 2 diabetes mellitus with diabetic polyneuropathy: Secondary | ICD-10-CM

## 2017-12-14 LAB — BASIC METABOLIC PANEL
BUN: 9 mg/dL (ref 7–25)
CO2: 31 mmol/L (ref 20–32)
Calcium: 9.6 mg/dL (ref 8.6–10.3)
Chloride: 102 mmol/L (ref 98–110)
Creat: 0.96 mg/dL (ref 0.70–1.18)
Glucose, Bld: 103 mg/dL — ABNORMAL HIGH (ref 65–99)
Potassium: 3.6 mmol/L (ref 3.5–5.3)
Sodium: 140 mmol/L (ref 135–146)

## 2017-12-14 LAB — HEMOGLOBIN A1C
Hgb A1c MFr Bld: 5.9 % of total Hgb — ABNORMAL HIGH (ref <5.7)
Mean Plasma Glucose: 123 (calc)
eAG (mmol/L): 6.8 (calc)

## 2017-12-17 ENCOUNTER — Encounter: Payer: Self-pay | Admitting: Internal Medicine

## 2017-12-17 ENCOUNTER — Ambulatory Visit: Payer: Medicare Other | Admitting: Internal Medicine

## 2017-12-17 VITALS — BP 122/60 | HR 73 | Temp 98.0°F | Wt 145.0 lb

## 2017-12-17 DIAGNOSIS — H409 Unspecified glaucoma: Secondary | ICD-10-CM

## 2017-12-17 DIAGNOSIS — R51 Headache: Secondary | ICD-10-CM

## 2017-12-17 DIAGNOSIS — E1169 Type 2 diabetes mellitus with other specified complication: Secondary | ICD-10-CM | POA: Diagnosis not present

## 2017-12-17 DIAGNOSIS — Z23 Encounter for immunization: Secondary | ICD-10-CM

## 2017-12-17 DIAGNOSIS — E1142 Type 2 diabetes mellitus with diabetic polyneuropathy: Secondary | ICD-10-CM | POA: Diagnosis not present

## 2017-12-17 DIAGNOSIS — K649 Unspecified hemorrhoids: Secondary | ICD-10-CM

## 2017-12-17 DIAGNOSIS — R519 Headache, unspecified: Secondary | ICD-10-CM

## 2017-12-17 DIAGNOSIS — E785 Hyperlipidemia, unspecified: Secondary | ICD-10-CM

## 2017-12-17 NOTE — Progress Notes (Signed)
[facility]  [facility] Provider:  Monserrath Junio L. Renato Gailseed, D.O., C.M.D.  Code Status: DNR Goals of Care:  Advanced Directives 06/16/2017  Does Patient Have a Medical Advance Directive? Yes  Type of Estate agentAdvance Directive Healthcare Power of ThayerAttorney;Living will  Does patient want to make changes to medical advance directive? -  Copy of Healthcare Power of Attorney in Chart? Yes  Would patient like information on creating a medical advance directive? No - Patient declined  Pre-existing out of facility DNR order (yellow form or pink MOST form) -     Chief Complaint  Patient presents with  . Medical Management of Chronic Issues    4mth follow-up, discuss labs, having headaces, taking BC Powder    HPI: Patient is a 76 y.o. male seen today for medical management of chronic diseases.    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  He had hit the corner of it and it "almost knocked him out".  He had a CT scan that was unremarkable Dec of last year.  It' s throbbing occasionally.  BC powder makes it go away, but it comes back another day.  If he turns his head, he feels it over his left eyebrow.  [facility] a week at the most.     DMII  Has been well controlled, had a pepsi binge on the holiday.    Glaucoma:  Doing fine, but he stepped on his glasses so they are getting repaired.  Using his latanoprost.    Hemorrhoids:  He had some bleeding from his rectum and he is going back to Dr. Elnoria HowardHung. It happened about 12/15, then ok but then recurred for a couple of days.  Thinks he was working strenuously on his car and that might have done it.    Past Medical History:  Diagnosis Date  . Abdominal pain, other specified site   . Backache, unspecified   . Carpal tunnel syndrome   . Essential hypertension, benign   . Impacted cerumen   . Impaired fasting glucose   . Other abnormal blood chemistry   . Pain in joint, lower  leg   . Special screening for malignant neoplasm of prostate   . Unspecified essential hypertension   . Unspecified hypothyroidism     Past Surgical History:  Procedure Laterality Date  . (L) KNEE SURGERY"TORN MENISCUS"    . EYE SURGERY Left 11/24/2014   cataracts    Allergies  Allergen Reactions  . Penicillins Swelling  . Shellfish Allergy Swelling    Outpatient Encounter Medications as of 12/17/2017  Medication Sig  . aspirin 81 MG tablet Take 81 mg by mouth daily.   . Aspirin-Salicylamide-Caffeine (BC HEADACHE POWDER PO) Take by mouth as needed.  [name]. atorvastatin (LIPITOR) 40 MG tablet TAKE 1 TABLET DAILY TO LOWER CHOLESTEROL  . hydrochlorothiazide (HYDRODIURIL) 25 MG tablet TAKE ONE AND ONE-HALF TABLETS DAILY  . latanoprost (XALATAN) 0.005 % ophthalmic solution Place 1 drop into both eyes at bedtime.  . metFORMIN (GLUCOPHAGE) 500 MG tablet Take 1 tablet in the morning and 1/2 tablet in the evening.  . Multiple Vitamins-Minerals (MULTIVITAMIN & MINERAL PO) Take 1 tablet by mouth daily.   [name]. VIAGRA 50 MG tablet Take 1 tablet (50 mg total) by mouth daily as needed for erectile dysfunction.   No facility-administered encounter medications on file as of 12/17/2017.     Review of Systems:  Review of Systems  Constitutional: Negative for chills and fever.  HENT: Negative for congestion.   Eyes: Negative for blurred vision.       Glaucoma, broken glasses  Respiratory: Negative for cough and shortness of breath.   Cardiovascular: Negative for chest pain and palpitations.  Gastrointestinal: Negative for abdominal pain, blood in stool, constipation and melena.       Bleeding hemorrhoids recently  Genitourinary: Negative for dysuria.  Musculoskeletal: Negative for falls and joint pain.  Skin: Negative for itching and rash.  Neurological: Negative for dizziness, loss of consciousness and weakness.  Endo/Heme/Allergies: Does not bruise/bleed easily.  Psychiatric/Behavioral: Negative  for depression and memory loss.    Health Maintenance  Topic Date Due  . FOOT EXAM  08/03/2017  . TETANUS/TDAP  12/02/2017  . URINE MICROALBUMIN  02/08/2018  . HEMOGLOBIN A1C  06/13/2018  . OPHTHALMOLOGY EXAM  08/15/2018  . COLON CANCER SCREENING 5 YEAR SIGMOIDOSCOPY  01/05/2021  . COLONOSCOPY  07/01/2024  . INFLUENZA VACCINE  Completed  . PNA vac Low Risk Adult  Completed    Physical Exam: Vitals:   12/17/17 1136  BP: 122/60  Pulse: 73  Temp: 98 F (36.7 C)  TempSrc: Oral  SpO2: 94%  Weight: 145 lb (65.8 kg)   Body mass index is 23.4 kg/m. Physical Exam  Constitutional: He is oriented to person, place, and time. He appears well-developed and well-nourished. No distress.  HENT:  Head: Normocephalic and atraumatic.  Tender superior to left eyebrow  Eyes: Pupils are equal, round, and reactive to light.  Cardiovascular: Normal rate, regular rhythm, normal heart sounds and intact distal pulses.  Pulmonary/Chest: Effort normal and breath sounds normal. No respiratory distress.  Abdominal: Bowel sounds are normal.  Musculoskeletal: Normal range of motion.  Neurological: He is alert and oriented to person, place, and time.  Skin: Skin is warm and dry.    Labs reviewed: Basic Metabolic Panel: Recent Labs    06/04/17 1349 06/11/17 0935 12/13/17 0846  NA 139 139 140  K 3.7 3.8 3.6  CL 100* 100 102  CO2 30 31 31   GLUCOSE 101* 92 103*  BUN 5* 6* 9  CREATININE 0.96 0.96 0.96  CALCIUM 9.7 9.4 9.6  TSH  --  1.49  --    Liver Function Tests: Recent Labs    02/06/17 1007 06/04/17 1349  AST 23 30  ALT 22 29  ALKPHOS 70 93  BILITOT 0.9 0.8  PROT 7.1 8.0  ALBUMIN 4.5 5.0   Recent Labs    06/04/17 1349  LIPASE 40   No results for input(s): AMMONIA in the last 8760 hours. CBC: Recent Labs    02/06/17 1007 06/04/17 1349  WBC 8.4 10.5  NEUTROABS 4,788  --   HGB 15.6 16.8  HCT 47.0 49.2  MCV 90.0 91.1  PLT 287 292   Lipid Panel: Recent Labs     02/06/17 1007 06/11/17 0935  CHOL 146 118  HDL 50 43  LDLCALC 73 52  TRIG 114 115  CHOLHDL 2.9 2.7   Lab Results  Component Value Date   HGBA1C 5.9 (H) 12/13/2017    Assessment/Plan 1. Frontal headache -advised to avoid too much BC powder, try ice when head hurts first  2. Controlled type 2 diabetes mellitus with diabetic polyneuropathy, without long-term current use of insulin (HCC) -cont asa, statin, metformin and cut out the pepsi again  3. Hyperlipidemia associated with type 2 diabetes mellitus (HCC) -cont lipitor, diet and exercise, very near  goal  4. Glaucoma of both eyes, unspecified glaucoma type -cont latanoprost drops, ophtho f/u  5. Need for Tdap vaccination -due to possibility that tdap may cost here, pt will get a pharmacy  6. Hemorrhoids, unspecified hemorrhoid type -f/u with Dr. Elnoria Howard as planned to band remaining hemorrhiods  Labs/tests ordered:  No orders of the defined types were placed in this encounter.  Next appt:  4 mos med mgt   Aerie Donica L. Shahid Flori, D.O. Geriatrics [facility] [address], [place] [address] Cell Phone (Mon-Fri 8am-5pm):  [phone] On Call:  [phone] & follow prompts after 5pm & weekends Office Phone:  [phone] Office Fax:  [phone]

## 2018-02-05 ENCOUNTER — Other Ambulatory Visit: Payer: Self-pay | Admitting: Internal Medicine

## 2018-02-12 ENCOUNTER — Ambulatory Visit (INDEPENDENT_AMBULATORY_CARE_PROVIDER_SITE_OTHER): Payer: Medicare Other

## 2018-02-12 VITALS — BP 138/64 | HR 96 | Temp 97.8°F | Ht 66.0 in | Wt 139.0 lb

## 2018-02-12 DIAGNOSIS — Z0000 Encounter for general adult medical examination without abnormal findings: Secondary | ICD-10-CM | POA: Diagnosis not present

## 2018-02-12 NOTE — Patient Instructions (Addendum)
[name] , Thank you for taking time to come for your Medicare Wellness Visit. I appreciate your ongoing commitment to your health goals. Please review the following plan we discussed and let me know if I can assist you in the future.   Screening recommendations/referrals: Colonoscopy excluded, you are 75 years or older Recommended yearly ophthalmology/optometry visit for glaucoma screening and checkup Recommended yearly dental visit for hygiene and checkup  Vaccinations: Influenza vaccine up to date, due 2019 fall season Pneumococcal vaccine up to date Tdap vaccine up to date, due 12/27/2027 Shingles vaccine up to date    Advanced directives: in chart  Conditions/risks identified: none  Next appointment: Dr. Renato Gails 5/2/019 @ 11am            Tyler Russell, RN 02/17/2019 @ 10:45am  Preventive Care 65 Years and Older, Male Preventive care refers to lifestyle choices and visits with your health care provider that can promote health and wellness. What does preventive care include?  A yearly physical exam. This is also called an annual well check.  Dental exams once or twice a year.  Routine eye exams. Ask your health care provider how often you should have your eyes checked.  Personal lifestyle choices, including:  Daily care of your teeth and gums.  Regular physical activity.  Eating a healthy diet.  Avoiding tobacco and drug use.  Limiting alcohol use.  Practicing safe sex.  Taking low doses of aspirin every day.  Taking vitamin and mineral supplements as recommended by your health care provider. What happens during an annual well check? The services and screenings done by your health care provider during your annual well check will depend on your age, overall health, lifestyle risk factors, and family history of disease. Counseling  Your health care provider may ask you questions about your:  Alcohol use.  Tobacco use.  Drug use.  Emotional well-being.  Home  and relationship well-being.  Sexual activity.  Eating habits.  History of falls.  Memory and ability to understand (cognition).  Work and work Astronomer. Screening  You may have the following tests or measurements:  Height, weight, and BMI.  Blood pressure.  Lipid and cholesterol levels. These may be checked every 5 years, or more frequently if you are over 16 years old.  Skin check.  Lung cancer screening. You may have this screening every year starting at age 69 if you have a 30-pack-year history of smoking and currently smoke or have quit within the past 15 years.  Fecal occult blood test (FOBT) of the stool. You may have this test every year starting at age 15.  Flexible sigmoidoscopy or colonoscopy. You may have a sigmoidoscopy every 5 years or a colonoscopy every 10 years starting at age 10.  Prostate cancer screening. Recommendations will vary depending on your family history and other risks.  Hepatitis C blood test.  Hepatitis B blood test.  Sexually transmitted disease (STD) testing.  Diabetes screening. This is done by checking your blood sugar (glucose) after you have not eaten for a while (fasting). You may have this done every 1-3 years.  Abdominal aortic aneurysm (AAA) screening. You may need this if you are a current or former smoker.  Osteoporosis. You may be screened starting at age 36 if you are at high risk. Talk with your health care provider about your test results, treatment options, and if necessary, the need for more tests. Vaccines  Your health care provider may recommend certain vaccines, such as:  Influenza  vaccine. This is recommended every year.  Tetanus, diphtheria, and acellular pertussis (Tdap, Td) vaccine. You may need a Td booster every 10 years.  Zoster vaccine. You may need this after age 76.  Pneumococcal 13-valent conjugate (PCV13) vaccine. One dose is recommended after age 76.  Pneumococcal polysaccharide (PPSV23) vaccine.  One dose is recommended after age 76. Talk to your health care provider about which screenings and vaccines you need and how often you need them. This information is not intended to replace advice given to you by your health care provider. Make sure you discuss any questions you have with your health care provider. Document Released: 12/31/2015 Document Revised: 08/23/2016 Document Reviewed: 10/05/2015 Elsevier Interactive Patient Education  2017 [employer]  Fall Prevention in the Home Falls can cause injuries. They can happen to people of all ages. There are many things you can do to make your home safe and to help prevent falls. What can I do on the outside of my home?  Regularly fix the edges of walkways and driveways and fix any cracks.  Remove anything that might make you trip as you walk through a door, such as a raised step or threshold.  Trim any bushes or trees on the path to your home.  Use bright outdoor lighting.  Clear any walking paths of anything that might make someone trip, such as rocks or tools.  Regularly check to see if handrails are loose or broken. Make sure that both sides of any steps have handrails.  Any raised decks and porches should have guardrails on the edges.  Have any leaves, snow, or ice cleared regularly.  Use sand or salt on walking paths during winter.  Clean up any spills in your garage right away. This includes oil or grease spills. What can I do in the bathroom?  Use night lights.  Install grab bars by the toilet and in the tub and shower. Do not use towel bars as grab bars.  Use non-skid mats or decals in the tub or shower.  If you need to sit down in the shower, use a plastic, non-slip stool.  Keep the floor dry. Clean up any water that spills on the floor as soon as it happens.  Remove soap buildup in the tub or shower regularly.  Attach bath mats securely with double-sided non-slip rug tape.  Do not have throw rugs and other  things on the floor that can make you trip. What can I do in the bedroom?  Use night lights.  Make sure that you have a light by your bed that is easy to reach.  Do not use any sheets or blankets that are too big for your bed. They should not hang down onto the floor.  Have a firm chair that has side arms. You can use this for support while you get dressed.  Do not have throw rugs and other things on the floor that can make you trip. What can I do in the kitchen?  Clean up any spills right away.  Avoid walking on wet floors.  Keep items that you use a lot in easy-to-reach places.  If you need to reach something above you, use a strong step stool that has a grab bar.  Keep electrical cords out of the way.  Do not use floor polish or wax that makes floors slippery. If you must use wax, use non-skid floor wax.  Do not have throw rugs and other things on the floor that can  make you trip. What can I do with my stairs?  Do not leave any items on the stairs.  Make sure that there are handrails on both sides of the stairs and use them. Fix handrails that are broken or loose. Make sure that handrails are as long as the stairways.  Check any carpeting to make sure that it is firmly attached to the stairs. Fix any carpet that is loose or worn.  Avoid having throw rugs at the top or bottom of the stairs. If you do have throw rugs, attach them to the floor with carpet tape.  Make sure that you have a light switch at the top of the stairs and the bottom of the stairs. If you do not have them, ask someone to add them for you. What else can I do to help prevent falls?  Wear shoes that:  Do not have high heels.  Have rubber bottoms.  Are comfortable and fit you well.  Are closed at the toe. Do not wear sandals.  If you use a stepladder:  Make sure that it is fully opened. Do not climb a closed stepladder.  Make sure that both sides of the stepladder are locked into place.  Ask  someone to hold it for you, if possible.  Clearly mark and make sure that you can see:  Any grab bars or handrails.  First and last steps.  Where the edge of each step is.  Use tools that help you move around (mobility aids) if they are needed. These include:  Canes.  Walkers.  Scooters.  Crutches.  Turn on the lights when you go into a dark area. Replace any light bulbs as soon as they burn out.  Set up your furniture so you have a clear path. Avoid moving your furniture around.  If any of your floors are uneven, fix them.  If there are any pets around you, be aware of where they are.  Review your medicines with your doctor. Some medicines can make you feel dizzy. This can increase your chance of falling. Ask your doctor what other things that you can do to help prevent falls. This information is not intended to replace advice given to you by your health care provider. Make sure you discuss any questions you have with your health care provider. Document Released: 09/30/2009 Document Revised: 05/11/2016 Document Reviewed: 01/08/2015 Elsevier Interactive Patient Education  2017 [employer].

## 2018-02-12 NOTE — Progress Notes (Signed)
Subjective:   Tyler Hull is a 77 y.o. male who presents for Medicare Annual/Subsequent preventive examination.  Last AWv- 02/08/2017       Objective:    Vitals: BP 138/64 (BP Location: Left Arm, Patient Position: Sitting)   Pulse 96   Temp 97.8 F (36.6 C) (Oral)   Ht 5\' 6"  (1.676 m)   Wt 139 lb (63 kg)   SpO2 97%   BMI 22.44 kg/m   Body mass index is 22.44 kg/m.  Advanced Directives 02/12/2018 06/16/2017 06/04/2017 02/08/2017 11/13/2016 08/03/2016 05/01/2016  Does Patient Have a Medical Advance Directive? Yes Yes No Yes Yes Yes Yes  Type of Estate agent of [place];Living will Healthcare Power of [place];Living will - Healthcare Power of [place];Living will Living will Healthcare Power of [place];Living will Healthcare Power of [place];Living will  Does patient want to make changes to medical advance directive? No - Patient declined - - - - - -  Copy of Healthcare Power of Attorney in Chart? Yes Yes - Yes - Yes Yes  Would patient like information on creating a medical advance directive? - No - Patient declined No - Patient declined - - - -  Pre-existing out of facility DNR order (yellow form or pink MOST form) - - - - - - -    Tobacco Social History   Tobacco Use  Smoking Status Former Smoker  . Years: 13.00  . Types: Cigarettes  . Last attempt to quit: 01/02/1966  . Years since quitting: 52.1  Smokeless Tobacco Never Used  Tobacco Comment   Quit at age 28      Counseling given: Not Answered Comment: Quit at age 60    Clinical Intake:  Pre-visit preparation completed: No  Pain : No/denies pain Pain Score: 2  Pain Type: Acute pain Pain Location: Foot Pain Orientation: Left Pain Descriptors / Indicators: Aching Pain Onset: 1 to 4 weeks ago Pain Frequency: Intermittent     Nutritional Risks: None Diabetes: No  How often do you need to have someone help you when you read instructions, pamphlets, or other written materials from  your doctor or pharmacy?: 1 - Never What is the last grade level you completed in school?: Some college  Interpreter Needed?: No  Information entered by :: [name], RN  Past Medical History:  Diagnosis Date  . Abdominal pain, other specified site   . Backache, unspecified   . Carpal tunnel syndrome   . Essential hypertension, benign   . Impacted cerumen   . Impaired fasting glucose   . Other abnormal blood chemistry   . Pain in joint, lower leg   . Special screening for malignant neoplasm of prostate   . Unspecified essential hypertension   . Unspecified hypothyroidism    Past Surgical History:  Procedure Laterality Date  . (L) KNEE SURGERY"TORN MENISCUS"    . EYE SURGERY Left 11/24/2014   cataracts   Family History  Problem Relation Age of Onset  . Alzheimer's disease Sister   . Cancer Brother 60       lung  . Diabetes Sister   . Diabetes Sister   . Lung cancer Sister 77       mets to back  . Kidney disease Sister   . Allergies Sister   . Obesity Sister    Social History   Socioeconomic History  . Marital status: Married    Spouse name: None  . Number of children: None  . Years of education: None  .  Highest education level: None  Social Needs  . Financial resource strain: Not hard at all  . Food insecurity - worry: Never true  . Food insecurity - inability: Never true  . Transportation needs - medical: No  . Transportation needs - non-medical: No  Occupational History  . None  Tobacco Use  . Smoking status: Former Smoker    Years: 13.00    Types: Cigarettes    Last attempt to quit: 01/02/1966    Years since quitting: 52.1  . Smokeless tobacco: Never Used  . Tobacco comment: Quit at age 77   Substance and Sexual Activity  . Alcohol use: No  . Drug use: No  . Sexual activity: Yes  Other Topics Concern  . None  Social History Narrative  . None    Outpatient Encounter Medications as of 02/12/2018  Medication Sig  . aspirin 81 MG tablet Take  81 mg by mouth daily.   . Aspirin-Salicylamide-Caffeine (BC HEADACHE POWDER PO) Take by mouth as needed.  [name]. atorvastatin (LIPITOR) 40 MG tablet TAKE 1 TABLET DAILY TO LOWER CHOLESTEROL  . hydrochlorothiazide (HYDRODIURIL) 25 MG tablet TAKE ONE AND ONE-HALF TABLETS DAILY  . latanoprost (XALATAN) 0.005 % ophthalmic solution Place 1 drop into both eyes at bedtime.  . metFORMIN (GLUCOPHAGE) 500 MG tablet TAKE 1 TABLET IN THE MORNING AND TAKE ONE-HALF (1/2) TABLET IN THE EVENING  . Multiple Vitamins-Minerals (MULTIVITAMIN & MINERAL PO) Take 1 tablet by mouth daily.   [name]. VIAGRA 50 MG tablet Take 1 tablet (50 mg total) by mouth daily as needed for erectile dysfunction.   No facility-administered encounter medications on file as of 02/12/2018.     Activities of Daily Living In your present state of health, do you have any difficulty performing the following activities: 02/12/2018  Hearing? Y  Vision? N  Difficulty concentrating or making decisions? N  Walking or climbing stairs? N  Dressing or bathing? N  Doing errands, shopping? N  Preparing Food and eating ? N  Using the Toilet? N  In the past six months, have you accidently leaked urine? N  Do you have problems with loss of bowel control? N  Managing your Medications? N  Managing your Finances? N  Housekeeping or managing your Housekeeping? N  Some recent data might be hidden    Patient Care Team: Kermit Baloeed, Tiffany L, DO as PCP - General (Geriatric Medicine) Mateo FlowHecker, Kathryn, MD as Consulting Physician (Ophthalmology) Jeani HawkingHung, Patrick, MD as Consulting Physician (Gastroenterology)   Assessment:   This is a routine wellness examination for Jomarie LongsJoseph.  Exercise Activities and Dietary recommendations Current Exercise Habits: Home exercise routine;Structured exercise class, Type of exercise: yoga;strength training/weights, Time (Minutes): 40, Frequency (Times/Week): 3, Weekly Exercise (Minutes/Week): 120, Intensity: Mild, Exercise limited by: None  identified  Goals    None      Fall Risk Fall Risk  02/12/2018 06/14/2017 02/08/2017 11/13/2016 10/30/2016  Falls in the past year? No No No No No   Is the patient's home free of loose throw rugs in walkways, pet beds, electrical cords, etc?   yes      Grab bars in the bathroom? yes      Handrails on the stairs?   yes      Adequate lighting?   yes  Depression Screen PHQ 2/9 Scores 02/12/2018 06/14/2017 02/08/2017 11/13/2016  PHQ - 2 Score 0 0 0 0    Cognitive Function MMSE - Mini Mental State Exam 02/12/2018 02/08/2017 01/03/2016  Orientation to time 4 5  5  Orientation to Place 5 5 5   Registration 3 3 3   Attention/ Calculation 5 5 5   Recall 2 3 3   Language- name 2 objects 2 2 2   Language- repeat 1 1 1   Language- follow 3 step command 3 3 3   Language- read & follow direction 1 1 1   Write a sentence 1 1 1   Copy design 1 1 1   Total score 28 30 30         Immunization History  Administered Date(s) Administered  . Influenza, High Dose Seasonal PF 08/30/2017  . Influenza,inj,Quad PF,6+ Mos 08/31/2014, 09/30/2015, 08/03/2016  . Influenza-Unspecified 09/17/2012, 09/17/2013  . Pneumococcal Conjugate-13 11/26/2014, 11/27/2014  . Pneumococcal Polysaccharide-23 12/03/2007  . Td 12/03/2007  . Tdap 12/26/2017  . Zoster 03/15/2013  . Zoster Recombinat (Shingrix) 06/01/2017, 11/14/2017    Qualifies for Shingles Vaccine? Up to date  Screening Tests Health Maintenance  Topic Date Due  . URINE MICROALBUMIN  02/08/2018  . HEMOGLOBIN A1C  06/13/2018  . OPHTHALMOLOGY EXAM  08/15/2018  . FOOT EXAM  12/17/2018  . COLON CANCER SCREENING 5 YEAR SIGMOIDOSCOPY  01/05/2021  . COLONOSCOPY  07/01/2024  . TETANUS/TDAP  12/27/2027  . INFLUENZA VACCINE  Completed  . PNA vac Low Risk Adult  Completed   Cancer Screenings: Lung: Low Dose CT Chest recommended if Age 40-80 years, 30 pack-year currently smoking OR have quit w/in 15years. Patient does not qualify. Colorectal: up to  date  Additional Screenings:  Hepatitis C Screening: declined    Plan:    I have personally reviewed and addressed the Medicare Annual Wellness questionnaire and have noted the following in the patient's chart:  A. Medical and social history B. Use of alcohol, tobacco or illicit drugs  C. Current medications and supplements D. Functional ability and status E.  Nutritional status F.  Physical activity G. Advance directives H. List of other physicians I.  Hospitalizations, surgeries, and ER visits in previous 12 months J.  Vitals K. Screenings to include hearing, vision, cognitive, depression L. Referrals and appointments - none  In addition, I have reviewed and discussed with patient certain preventive protocols, quality metrics, and best practice recommendations. A written personalized care plan for preventive services as well as general preventive health recommendations were provided to patient.  See attached scanned questionnaire for additional information.   Signed,   [name], RN Nurse Health Advisor  Patient Concerns: L foot, achy pain 2/10 over last 2 weeks

## 2018-03-15 ENCOUNTER — Other Ambulatory Visit: Payer: Self-pay | Admitting: Internal Medicine

## 2018-03-21 ENCOUNTER — Ambulatory Visit: Payer: Self-pay | Admitting: Internal Medicine

## 2018-03-28 ENCOUNTER — Encounter: Payer: Self-pay | Admitting: Internal Medicine

## 2018-03-28 LAB — HM DIABETES EYE EXAM

## 2018-04-15 ENCOUNTER — Other Ambulatory Visit: Payer: Medicare Other

## 2018-04-15 DIAGNOSIS — E785 Hyperlipidemia, unspecified: Secondary | ICD-10-CM

## 2018-04-15 DIAGNOSIS — E1169 Type 2 diabetes mellitus with other specified complication: Secondary | ICD-10-CM

## 2018-04-15 DIAGNOSIS — E1142 Type 2 diabetes mellitus with diabetic polyneuropathy: Secondary | ICD-10-CM

## 2018-04-15 LAB — LIPID PANEL
Cholesterol: 131 mg/dL (ref <200)
HDL: 49 mg/dL (ref >40)
LDL Cholesterol (Calc): 64 mg/dL (calc)
Non-HDL Cholesterol (Calc): 82 mg/dL (calc) (ref <130)
Total CHOL/HDL Ratio: 2.7 (calc) (ref <5.0)
Triglycerides: 97 mg/dL (ref <150)

## 2018-04-15 LAB — BASIC METABOLIC PANEL
BUN: 11 mg/dL (ref 7–25)
CO2: 32 mmol/L (ref 20–32)
Calcium: 9.5 mg/dL (ref 8.6–10.3)
Chloride: 101 mmol/L (ref 98–110)
Creat: 0.92 mg/dL (ref 0.70–1.18)
Glucose, Bld: 96 mg/dL (ref 65–99)
Potassium: 3.7 mmol/L (ref 3.5–5.3)
Sodium: 139 mmol/L (ref 135–146)

## 2018-04-15 LAB — CBC WITH DIFFERENTIAL/PLATELET
Basophils Absolute: 48 cells/uL (ref 0–200)
Basophils Relative: 0.6 %
Eosinophils Absolute: 152 cells/uL (ref 15–500)
Eosinophils Relative: 1.9 %
HCT: 45 % (ref 38.5–50.0)
Hemoglobin: 15.4 g/dL (ref 13.2–17.1)
Lymphs Abs: 2264 cells/uL (ref 850–3900)
MCH: 30.3 pg (ref 27.0–33.0)
MCHC: 34.2 g/dL (ref 32.0–36.0)
MCV: 88.6 fL (ref 80.0–100.0)
MPV: 11.2 fL (ref 7.5–12.5)
Monocytes Relative: 7.9 %
Neutro Abs: 4904 cells/uL (ref 1500–7800)
Neutrophils Relative %: 61.3 %
Platelets: 292 10*3/uL (ref 140–400)
RBC: 5.08 10*6/uL (ref 4.20–5.80)
RDW: 11.9 % (ref 11.0–15.0)
Total Lymphocyte: 28.3 %
WBC mixed population: 632 cells/uL (ref 200–950)
WBC: 8 10*3/uL (ref 3.8–10.8)

## 2018-04-16 LAB — HEMOGLOBIN A1C
Hgb A1c MFr Bld: 5.8 % of total Hgb — ABNORMAL HIGH (ref <5.7)
Mean Plasma Glucose: 120 (calc)
eAG (mmol/L): 6.6 (calc)

## 2018-04-18 ENCOUNTER — Encounter: Payer: Self-pay | Admitting: Internal Medicine

## 2018-04-18 ENCOUNTER — Ambulatory Visit: Payer: Medicare Other | Admitting: Internal Medicine

## 2018-04-18 VITALS — BP 120/60 | HR 60 | Temp 97.9°F | Ht 66.0 in | Wt 142.0 lb

## 2018-04-18 DIAGNOSIS — E1169 Type 2 diabetes mellitus with other specified complication: Secondary | ICD-10-CM | POA: Diagnosis not present

## 2018-04-18 DIAGNOSIS — E785 Hyperlipidemia, unspecified: Secondary | ICD-10-CM | POA: Diagnosis not present

## 2018-04-18 DIAGNOSIS — I10 Essential (primary) hypertension: Secondary | ICD-10-CM | POA: Diagnosis not present

## 2018-04-18 DIAGNOSIS — M7918 Myalgia, other site: Secondary | ICD-10-CM | POA: Diagnosis not present

## 2018-04-18 DIAGNOSIS — E1142 Type 2 diabetes mellitus with diabetic polyneuropathy: Secondary | ICD-10-CM | POA: Diagnosis not present

## 2018-04-18 NOTE — Progress Notes (Signed)
Location:  [facility] clinic Provider:  Nasire Reali L. Renato Gails, D.O., C.M.D.  Code Status: full code Goals of Care:  Advanced Directives 04/18/2018  Does Patient Have a Medical Advance Directive? Yes  Type of Advance Directive Living will  Does patient want to make changes to medical advance directive? No - Patient declined  Copy of Healthcare Power of Attorney in Chart? -  Would patient like information on creating a medical advance directive? No - Patient declined  Pre-existing out of facility DNR order (yellow form or pink MOST form) -   Chief Complaint  Patient presents with  . Medical Management of Chronic Issues    follow-up    HPI: Patient is a 77 y.o. male seen today for medical management of chronic diseases.    He asks about taking aspirin due to latest guidelines--given he's diabetic, he needs to continue.  He reports he wants the [facility] viagra not the brand due to cost.  He can't sleep on his right side over his buttocks.  It hurts inside, but does not hurt to be touched.  Uses aleve which allows him to fall asleep, but it comes back.  Dr. Shon Baton told him to come back to him after his back surgery if he had any buttock pain per pt's wife.  He has been bothered by these symptoms for 2 months now.    Bowels are moving well.  No blood.  Eating breyer's lactose free ice cream.  Labs reviewed.   hba1c in prediabetic range.  Lipids at goal.  Past Medical History:  Diagnosis Date  . Abdominal pain, other specified site   . Backache, unspecified   . Carpal tunnel syndrome   . Essential hypertension, benign   . Impacted cerumen   . Impaired fasting glucose   . Other abnormal blood chemistry   . Pain in joint, lower leg   . Special screening for malignant neoplasm of prostate   . Unspecified essential hypertension   . Unspecified hypothyroidism     Past Surgical History:  Procedure Laterality Date  . (L) KNEE SURGERY"TORN MENISCUS"    . EYE SURGERY Left 11/24/2014   cataracts    Allergies  Allergen Reactions  . Penicillins Swelling  . Shellfish Allergy Swelling    Outpatient Encounter Medications as of 04/18/2018  Medication Sig  . aspirin 81 MG tablet Take 81 mg by mouth daily.   . Aspirin-Salicylamide-Caffeine (BC HEADACHE POWDER PO) Take by mouth as needed.  [name] atorvastatin (LIPITOR) 40 MG tablet TAKE 1 TABLET DAILY TO LOWER CHOLESTEROL  . hydrochlorothiazide (HYDRODIURIL) 25 MG tablet TAKE ONE AND ONE-HALF TABLETS DAILY  . latanoprost (XALATAN) 0.005 % ophthalmic solution Place 1 drop into both eyes at bedtime.  . metFORMIN (GLUCOPHAGE) 500 MG tablet TAKE 1 TABLET IN THE MORNING AND TAKE ONE-HALF (1/2) TABLET IN THE EVENING  . Multiple Vitamins-Minerals (MULTIVITAMIN & MINERAL PO) Take 1 tablet by mouth daily.   [name] VIAGRA 50 MG tablet Take 1 tablet (50 mg total) by mouth daily as needed for erectile dysfunction.   No facility-administered encounter medications on file as of 04/18/2018.     Review of Systems:  Review of Systems  Constitutional: Negative for chills, fever and malaise/fatigue.  HENT: Negative for congestion and hearing loss.   Eyes: Negative for blurred vision.  Respiratory: Negative for shortness of breath.   Cardiovascular: Negative for chest pain, palpitations and leg swelling.  Gastrointestinal: Negative for abdominal pain, blood in stool, constipation and melena.  Genitourinary: Negative  for dysuria.  Musculoskeletal: Negative for back pain, falls, joint pain, myalgias and neck pain.       Pain in right buttock  Skin: Negative for itching and rash.  Neurological: Negative for dizziness.  Endo/Heme/Allergies: Does not bruise/bleed easily.  Psychiatric/Behavioral: Negative for depression and memory loss.    Health Maintenance  Topic Date Due  . URINE MICROALBUMIN  02/08/2018  . INFLUENZA VACCINE  07/18/2018  . OPHTHALMOLOGY EXAM  08/15/2018  . HEMOGLOBIN A1C  10/15/2018  . FOOT EXAM  12/17/2018  . COLON CANCER  SCREENING 5 YEAR SIGMOIDOSCOPY  01/05/2021  . COLONOSCOPY  07/01/2024  . TETANUS/TDAP  12/27/2027  . PNA vac Low Risk Adult  Completed    Physical Exam: Vitals:   04/18/18 1131  BP: 120/60  Pulse: 60  Temp: 97.9 F (36.6 C)  TempSrc: Oral  SpO2: 98%  Weight: 142 lb (64.4 kg)  Height:  (1.676 m)   Body mass index is 22.92 kg/m. Physical Exam  Constitutional: He is oriented to person, place, and time. He appears well-developed and well-nourished. No distress.  HENT:  Head: Normocephalic and atraumatic.  Cardiovascular: Normal rate, regular rhythm and intact distal pulses.  Murmur heard. Pulmonary/Chest: Effort normal and breath sounds normal. No respiratory distress.  Abdominal: Bowel sounds are normal.  Musculoskeletal: Normal range of motion. He exhibits no tenderness.  Notes feeling of being punched in right buttock if lying on right side without pressure on that area and when he stands on that side  Neurological: He is alert and oriented to person, place, and time.  Skin: Skin is warm and dry.  Psychiatric: He has a normal mood and affect.    Labs reviewed: Basic Metabolic Panel: Recent Labs    06/11/17 0935 12/13/17 0846 04/15/18 0933  NA 139 140 139  K 3.8 3.6 3.7  CL 100 102 101  CO2 31 31 32  GLUCOSE 92 103* 96  BUN 6* 9 11  CREATININE 0.96 0.96 0.92  CALCIUM 9.4 9.6 9.5  TSH 1.49  --   --    Liver Function Tests: Recent Labs    06/04/17 1349  AST 30  ALT 29  ALKPHOS 93  BILITOT 0.8  PROT 8.0  ALBUMIN 5.0   Recent Labs    06/04/17 1349  LIPASE 40   No results for input(s): AMMONIA in the last 8760 hours. CBC: Recent Labs    06/04/17 1349 04/15/18 0933  WBC 10.5 8.0  NEUTROABS  --  4,904  HGB 16.8 15.4  HCT 49.2 45.0  MCV 91.1 88.6  PLT 292 292   Lipid Panel: Recent Labs    06/11/17 0935 04/15/18 0933  CHOL 118 131  HDL 43 49  LDLCALC 52 64  TRIG 115 97  CHOLHDL 2.7 2.7   Lab Results  Component Value Date   HGBA1C  5.8 (H) 04/15/2018     Assessment/Plan 1. Right buttock pain -recommended f/u with Dr. Shon Baton as it seems like neuropathic pain from sciatic nerve or other radiculopathy from back, but has no local back pain at present  2. Controlled type 2 diabetes mellitus with diabetic polyneuropathy, without long-term current use of insulin (HCC) - cont metformin therapy, baby asa, statin therapy  -hba1c consistently in prediabetic range now - Basic metabolic panel; Future - Hemoglobin A1c; Future  3. Hyperlipidemia associated with type 2 diabetes mellitus (HCC) -cont statin therapy, LDL at goal  4. Essential hypertension, benign - bp at goal with current regimen of hctz  alone, has not had proteinuria  -ideally should be on ace - Basic metabolic panel; Future  Labs/tests ordered:   Orders Placed This Encounter  Procedures  . Basic metabolic panel    Standing Status:   Future    Standing Expiration Date:   12/19/2018    Order Specific Question:   Has the patient fasted?    Answer:   Yes  . Hemoglobin A1c    Standing Status:   Future    Standing Expiration Date:   12/19/2018   Next appt:  09/09/2018 med mgt, labs before  Lakeyia Surber L. Giamarie Bueche, D.O. Geriatrics [facility] [address], [place] [address] Cell Phone (Mon-Fri 8am-5pm):  [phone] On Call:  [phone] & follow prompts after 5pm & weekends Office Phone:  [phone] Office Fax:  [phone]

## 2018-05-15 ENCOUNTER — Other Ambulatory Visit: Payer: Medicare Other
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
## 2018-05-27 ENCOUNTER — Other Ambulatory Visit: Payer: Self-pay | Admitting: Internal Medicine

## 2018-05-30 ENCOUNTER — Other Ambulatory Visit: Payer: Self-pay | Admitting: Internal Medicine

## 2018-05-30 DIAGNOSIS — N529 Male erectile dysfunction, unspecified: Secondary | ICD-10-CM

## 2018-05-30 MED ORDER — SILDENAFIL CITRATE 50 MG PO TABS: 50.0000 mg | ORAL_TABLET | Freq: Every day | ORAL | 5 refills | Status: DC | PRN

## 2018-07-10 ENCOUNTER — Encounter: Payer: Self-pay | Admitting: Internal Medicine

## 2018-07-10 ENCOUNTER — Ambulatory Visit: Payer: Medicare Other | Admitting: Internal Medicine

## 2018-07-10 VITALS — BP 116/68 | HR 94 | Temp 97.9°F | Ht 66.0 in | Wt 140.0 lb

## 2018-07-10 DIAGNOSIS — H6122 Impacted cerumen, left ear: Secondary | ICD-10-CM | POA: Diagnosis not present

## 2018-07-10 NOTE — Patient Instructions (Signed)
The best protocol to address wax build up is : Do not use Q-tips as this simply packs wax deeper in the ear canal.  Put 2-3 drops of  mineral oil in the affected  ear at night to soften the wax .Cover the canal with a  cotton ball to prevent the oil from staining bed linens. In the morning fill the ear canal with hydrogen peroxide & lie on  the side opposite the affected ear for 10-15 minutes. After allowing this period of time for the peroxide to dissolve the wax ;shower and use the thinnest washrag available to wick out the wax. If both ears are involved ; alternate this treatment from ear to ear each night until no wax is found on the washrag.

## 2018-07-10 NOTE — Progress Notes (Signed)
    07/10/2018  [facility] a [facility]  follow up for specific acute issue of left ear pain.  HPI: Symptoms began approximately 10 days ago after yawning. He's had intermittent sharp pain in the left ear since w/o radiation. At present there is more of a pressure sensation. His wife has been instilling olive oil which helps. He denies any associated upper or lower respiratory tract infection symptoms or extrinsic symptoms. He denies any sensory changes in smell, taste, or hearing. He does have glaucoma and uses eyedrops.  Review of systems:  Constitutional: No fever Eyes: No discharge, pain,new vision change ENT/mouth: No nasal congestion,  purulent discharge, sore throat  Respiratory: No cough, sputum production Allergy/immunology: No itchy/watery eyes, significant sneezing, urticaria, angioedema  Physical exam:  Pertinent or positive findings: Slight scleritis is present. Cerumen impaction is present on the left. There is decreased hearing to whisper on the left. The maxilla is edentulous.  General appearance: Adequately nourished; no acute distress, increased work of breathing is present.   Lymphatic: No lymphadenopathy about the head, neck, axilla. Eyes: No conjunctival inflammation or lid edema is present. There is no scleral icterus. Ears:  External ear exam shows no significant lesions or deformities.   Nose:  External nasal examination shows no deformity or inflammation. Nasal mucosa are pink and moist without lesions, exudates Oral exam:  Lips and gums are healthy appearing. There is no oropharyngeal erythema or exudate. Neck:  No masses, tenderness noted.     See current diagnosis with associated  therapeutic plan

## 2018-08-04 ENCOUNTER — Other Ambulatory Visit: Payer: Self-pay | Admitting: Internal Medicine

## 2018-09-05 ENCOUNTER — Other Ambulatory Visit: Payer: Medicare Other

## 2018-09-05 DIAGNOSIS — E1142 Type 2 diabetes mellitus with diabetic polyneuropathy: Secondary | ICD-10-CM

## 2018-09-05 DIAGNOSIS — I10 Essential (primary) hypertension: Secondary | ICD-10-CM

## 2018-09-06 LAB — BASIC METABOLIC PANEL
BUN: 11 mg/dL (ref 7–25)
CO2: 31 mmol/L (ref 20–32)
Calcium: 9.8 mg/dL (ref 8.6–10.3)
Chloride: 101 mmol/L (ref 98–110)
Creat: 0.94 mg/dL (ref 0.70–1.18)
Glucose, Bld: 96 mg/dL (ref 65–99)
Potassium: 3.7 mmol/L (ref 3.5–5.3)
Sodium: 140 mmol/L (ref 135–146)

## 2018-09-06 LAB — HEMOGLOBIN A1C
Hgb A1c MFr Bld: 5.9 % of total Hgb — ABNORMAL HIGH (ref <5.7)
Mean Plasma Glucose: 123 (calc)
eAG (mmol/L): 6.8 (calc)

## 2018-09-09 ENCOUNTER — Ambulatory Visit: Payer: Medicare Other | Admitting: Internal Medicine

## 2018-09-09 ENCOUNTER — Encounter: Payer: Self-pay | Admitting: Internal Medicine

## 2018-09-09 VITALS — BP 122/60 | HR 56 | Temp 97.8°F | Ht 66.0 in | Wt 141.0 lb

## 2018-09-09 DIAGNOSIS — Z23 Encounter for immunization: Secondary | ICD-10-CM

## 2018-09-09 DIAGNOSIS — I700 Atherosclerosis of aorta: Secondary | ICD-10-CM

## 2018-09-09 DIAGNOSIS — E1169 Type 2 diabetes mellitus with other specified complication: Secondary | ICD-10-CM | POA: Diagnosis not present

## 2018-09-09 DIAGNOSIS — E1142 Type 2 diabetes mellitus with diabetic polyneuropathy: Secondary | ICD-10-CM | POA: Diagnosis not present

## 2018-09-09 DIAGNOSIS — E039 Hypothyroidism, unspecified: Secondary | ICD-10-CM

## 2018-09-09 DIAGNOSIS — E785 Hyperlipidemia, unspecified: Secondary | ICD-10-CM

## 2018-09-09 DIAGNOSIS — M79652 Pain in left thigh: Secondary | ICD-10-CM | POA: Diagnosis not present

## 2018-09-09 DIAGNOSIS — I10 Essential (primary) hypertension: Secondary | ICD-10-CM

## 2018-09-09 NOTE — Progress Notes (Signed)
[facility] Provider:  Kamarion Zagami L. Renato Gailseed, D.O., C.M.D.  Goals of Care:  Advanced Directives 09/09/2018  Does Patient Have a Medical Advance Directive? Yes  Type of Estate agentAdvance Directive Healthcare Power of [place];Living will  Does patient want to make changes to medical advance directive? No - Patient declined  Copy of Healthcare Power of Attorney in Chart? Yes  Would patient like information on creating a medical advance directive? -  Pre-existing out of facility DNR order (yellow form or pink MOST form) -     Chief Complaint  Patient presents with  . Medical Management of Chronic Issues    4mth follow-up    HPI: Patient is a 77 y.o. male seen today for medical management of chronic diseases.    He had a pain in his left medial thigh that he couldn't walk on it, he had to hop and he had to hold his leg out to get a comfortable position.  There was a knot there, but not swollen.  He was hitting it to make it go away.  He says it felt warm and burning.  He still feels a spot there.  His leg has not swollen.  He was fine by morning.  Even after a half hour.  When he straighten his left leg, he still feels it.  He had watched tv with his leg up on the arm of the chair.  They also had painted their foyer and great room.  He was on the floor scraping paint and had cramps in his calves from that.    Got his flu shot.    Sugar average up just slightly.  Has not been on his pepsi the way he has in the past.   Electrolytes and kidneys fine.  Needs urine microalbumin checked.  Past Medical History:  Diagnosis Date  . Abdominal pain, other specified site   . Backache, unspecified   . Carpal tunnel syndrome   . Essential hypertension, benign   . Impacted cerumen   . Impaired fasting glucose   . Other abnormal blood chemistry   . Pain in joint, lower leg   . Special screening for malignant neoplasm of prostate   . Unspecified essential hypertension   . Unspecified hypothyroidism      Past Surgical History:  Procedure Laterality Date  . (L) KNEE SURGERY"TORN MENISCUS"    . EYE SURGERY Left 11/24/2014   cataracts    Allergies  Allergen Reactions  . Penicillins Swelling  . Shellfish Allergy Swelling    Outpatient Encounter Medications as of 09/09/2018  Medication Sig  . aspirin 81 MG tablet Take 81 mg by mouth daily.   . Aspirin-Salicylamide-Caffeine (BC HEADACHE POWDER PO) Take by mouth as needed.  [name]. atorvastatin (LIPITOR) 40 MG tablet TAKE 1 TABLET DAILY TO LOWER CHOLESTEROL  . hydrochlorothiazide (HYDRODIURIL) 25 MG tablet TAKE ONE AND ONE-HALF TABLETS DAILY  . latanoprost (XALATAN) 0.005 % ophthalmic solution Place 1 drop into both eyes at bedtime.  . metFORMIN (GLUCOPHAGE) 500 MG tablet TAKE 1 TABLET IN THE MORNING AND TAKE ONE-HALF (1/2) TABLET IN THE EVENING  . Multiple Vitamins-Minerals (MULTIVITAMIN & MINERAL PO) Take 1 tablet by mouth daily.   . sildenafil (VIAGRA) 50 MG tablet Take 1 tablet (50 mg total) by mouth daily as needed for erectile dysfunction.   No facility-administered encounter medications on file as of 09/09/2018.     Review of Systems:  Review of Systems  Constitutional: Negative for chills and fever.  HENT:  Negative for hearing loss.   Eyes: Negative for blurred vision.       Glaucoma  Respiratory: Negative for cough and shortness of breath.   Cardiovascular: Negative for chest pain and palpitations.  Gastrointestinal: Negative for abdominal pain, blood in stool, constipation, diarrhea and melena.  Genitourinary: Negative for dysuria.  Musculoskeletal: Positive for myalgias. Negative for falls and joint pain.       Left thigh  Skin: Negative for itching and rash.  Neurological: Negative for dizziness and loss of consciousness.  Endo/Heme/Allergies: Does not bruise/bleed easily.  Psychiatric/Behavioral: Negative for depression and memory loss. The patient does not have insomnia.     Health Maintenance  Topic Date Due  .  URINE MICROALBUMIN  02/08/2018  . INFLUENZA VACCINE  07/18/2018  . FOOT EXAM  12/17/2018  . HEMOGLOBIN A1C  03/06/2019  . OPHTHALMOLOGY EXAM  03/29/2019  . TETANUS/TDAP  12/27/2027  . PNA vac Low Risk Adult  Completed    Physical Exam: Vitals:   09/09/18 1115  BP: 122/60  Pulse: (!) 56  Temp: 97.8 F (36.6 C)  TempSrc: Oral  SpO2: 93%  Weight: 141 lb (64 kg)  Height: 5\' 6"  (1.676 m)   Body mass index is 22.76 kg/m. Physical Exam  Constitutional: He is oriented to person, place, and time. He appears well-developed and well-nourished. No distress.  Cardiovascular: Normal rate, regular rhythm, normal heart sounds and intact distal pulses.  Pulmonary/Chest: Effort normal and breath sounds normal. No respiratory distress.  Abdominal: Bowel sounds are normal.  Musculoskeletal: Normal range of motion.  No tenderness, visible swelling or palpable swelling in left medial thigh, some spider veins present, pulses intact, leg warm  Neurological: He is alert and oriented to person, place, and time.  Skin: Skin is warm and dry.  Psychiatric: He has a normal mood and affect.    Labs reviewed: Basic Metabolic Panel: Recent Labs    12/13/17 0846 04/15/18 0933 09/05/18 0944  NA 140 139 140  K 3.6 3.7 3.7  CL 102 101 101  CO2 31 32 31  GLUCOSE 103* 96 96  BUN 9 11 11   CREATININE 0.96 0.92 0.94  CALCIUM 9.6 9.5 9.8   Liver Function Tests: No results for input(s): AST, ALT, ALKPHOS, BILITOT, PROT, ALBUMIN in the last 8760 hours. No results for input(s): LIPASE, AMYLASE in the last 8760 hours. No results for input(s): AMMONIA in the last 8760 hours. CBC: Recent Labs    04/15/18 0933  WBC 8.0  NEUTROABS 4,904  HGB 15.4  HCT 45.0  MCV 88.6  PLT 292   Lipid Panel: Recent Labs    04/15/18 0933  CHOL 131  HDL 49  LDLCALC 64  TRIG 97  CHOLHDL 2.7   Lab Results  Component Value Date   HGBA1C 5.9 (H) 09/05/2018   Assessment/Plan 1. Need for influenza vaccination -  Flu vaccine HIGH DOSE PF (Fluzone High dose)  2. Acute pain of left thigh -seems this is a muscle cramp from the position he was resting in his recliner  3. Controlled type 2 diabetes mellitus with diabetic polyneuropathy, without long-term current use of insulin (HCC) -well controlled, needs urine micro checked - Hemoglobin A1c; Future - CBC with Differential/Platelet; Future - COMPLETE METABOLIC PANEL WITH GFR; Future - Microalbumin / creatinine urine ratio  4. Hyperlipidemia associated with type 2 diabetes mellitus (HCC) -cont lipitor therapy  5. Essential hypertension, benign -bp at goal with just hctz  6. Hypothyroidism, unspecified type - had been short  term issue, f/u lab next time - TSH; Future  7. Abdominal aortic atherosclerosis (HCC) -had been noted on imaging--still an issue  Labs/tests ordered:   Orders Placed This Encounter  Procedures  . Flu vaccine HIGH DOSE PF (Fluzone High dose)  . Hemoglobin A1c    Standing Status:   Future    Standing Expiration Date:   09/10/2019  . CBC with Differential/Platelet    Standing Status:   Future    Standing Expiration Date:   09/10/2019  . COMPLETE METABOLIC PANEL WITH GFR    Standing Status:   Future    Standing Expiration Date:   09/10/2019  . TSH    Standing Status:   Future    Standing Expiration Date:   09/10/2019  . Microalbumin / creatinine urine ratio    Next appt:  02/17/2019   Dane Bloch L. Rudolph Dobler, D.O. Geriatrics [facility] [address], [place] [address] Cell Phone (Mon-Fri 8am-5pm):  [phone] On Call:  [phone] & follow prompts after 5pm & weekends Office Phone:  [phone] Office Fax:  [phone]

## 2018-09-10 LAB — MICROALBUMIN / CREATININE URINE RATIO
Creatinine, Urine: 208 mg/dL (ref 20–320)
Microalb Creat Ratio: 14 mcg/mg creat (ref <30)
Microalb, Ur: 3 mg/dL

## 2018-11-23 ENCOUNTER — Other Ambulatory Visit: Payer: Self-pay | Admitting: Internal Medicine

## 2018-12-23 DIAGNOSIS — H401112 Primary open-angle glaucoma, right eye, moderate stage: Secondary | ICD-10-CM | POA: Diagnosis not present

## 2019-01-06 ENCOUNTER — Other Ambulatory Visit: Payer: Medicare HMO

## 2019-01-06 DIAGNOSIS — E1142 Type 2 diabetes mellitus with diabetic polyneuropathy: Secondary | ICD-10-CM | POA: Diagnosis not present

## 2019-01-06 DIAGNOSIS — H402222 Chronic angle-closure glaucoma, left eye, moderate stage: Secondary | ICD-10-CM | POA: Diagnosis not present

## 2019-01-06 DIAGNOSIS — E039 Hypothyroidism, unspecified: Secondary | ICD-10-CM

## 2019-01-07 LAB — COMPLETE METABOLIC PANEL WITH GFR
AG Ratio: 1.8 (calc) (ref 1.0–2.5)
ALT: 18 U/L (ref 9–46)
AST: 22 U/L (ref 10–35)
Albumin: 4.2 g/dL (ref 3.6–5.1)
Alkaline phosphatase (APISO): 78 U/L (ref 40–115)
BUN: 11 mg/dL (ref 7–25)
CO2: 27 mmol/L (ref 20–32)
Calcium: 9 mg/dL (ref 8.6–10.3)
Chloride: 103 mmol/L (ref 98–110)
Creat: 1 mg/dL (ref 0.70–1.18)
GFR, Est African American: 84 mL/min/{1.73_m2} (ref >=60)
GFR, Est Non African American: 73 mL/min/{1.73_m2} (ref >=60)
Globulin: 2.3 g/dL (calc) (ref 1.9–3.7)
Glucose, Bld: 97 mg/dL (ref 65–99)
Potassium: 3.8 mmol/L (ref 3.5–5.3)
Sodium: 141 mmol/L (ref 135–146)
Total Bilirubin: 0.3 mg/dL (ref 0.2–1.2)
Total Protein: 6.5 g/dL (ref 6.1–8.1)

## 2019-01-07 LAB — CBC WITH DIFFERENTIAL/PLATELET
Absolute Monocytes: 958 cells/uL — ABNORMAL HIGH (ref 200–950)
Basophils Absolute: 47 cells/uL (ref 0–200)
Basophils Relative: 0.5 %
Eosinophils Absolute: 121 cells/uL (ref 15–500)
Eosinophils Relative: 1.3 %
HCT: 46.6 % (ref 38.5–50.0)
Hemoglobin: 16.2 g/dL (ref 13.2–17.1)
Lymphs Abs: 1590 cells/uL (ref 850–3900)
MCH: 30.8 pg (ref 27.0–33.0)
MCHC: 34.8 g/dL (ref 32.0–36.0)
MCV: 88.6 fL (ref 80.0–100.0)
MPV: 10.7 fL (ref 7.5–12.5)
Monocytes Relative: 10.3 %
Neutro Abs: 6584 cells/uL (ref 1500–7800)
Neutrophils Relative %: 70.8 %
Platelets: 283 10*3/uL (ref 140–400)
RBC: 5.26 10*6/uL (ref 4.20–5.80)
RDW: 11.8 % (ref 11.0–15.0)
Total Lymphocyte: 17.1 %
WBC: 9.3 10*3/uL (ref 3.8–10.8)

## 2019-01-07 LAB — TSH: TSH: 3.1 mIU/L (ref 0.40–4.50)

## 2019-01-07 LAB — HEMOGLOBIN A1C
Hgb A1c MFr Bld: 6 % of total Hgb — ABNORMAL HIGH (ref <5.7)
Mean Plasma Glucose: 126 (calc)
eAG (mmol/L): 7 (calc)

## 2019-01-13 ENCOUNTER — Ambulatory Visit: Payer: Medicare HMO | Admitting: Internal Medicine

## 2019-01-13 ENCOUNTER — Encounter: Payer: Self-pay | Admitting: Internal Medicine

## 2019-01-13 VITALS — BP 120/70 | HR 68 | Temp 97.8°F | Ht 66.0 in | Wt 142.0 lb

## 2019-01-13 DIAGNOSIS — I10 Essential (primary) hypertension: Secondary | ICD-10-CM | POA: Diagnosis not present

## 2019-01-13 DIAGNOSIS — E1169 Type 2 diabetes mellitus with other specified complication: Secondary | ICD-10-CM | POA: Diagnosis not present

## 2019-01-13 DIAGNOSIS — E1142 Type 2 diabetes mellitus with diabetic polyneuropathy: Secondary | ICD-10-CM | POA: Diagnosis not present

## 2019-01-13 DIAGNOSIS — E785 Hyperlipidemia, unspecified: Secondary | ICD-10-CM | POA: Diagnosis not present

## 2019-01-13 DIAGNOSIS — I700 Atherosclerosis of aorta: Secondary | ICD-10-CM | POA: Diagnosis not present

## 2019-01-13 DIAGNOSIS — E039 Hypothyroidism, unspecified: Secondary | ICD-10-CM | POA: Diagnosis not present

## 2019-01-13 DIAGNOSIS — R040 Epistaxis: Secondary | ICD-10-CM | POA: Diagnosis not present

## 2019-01-13 NOTE — Progress Notes (Signed)
[facility] Provider:  Joelie Schou L. Renato Gailseed, D.O., C.M.D.  Goals of Care:  Advanced Directives 09/09/2018  Does Patient Have a Medical Advance Directive? Yes  Type of Estate agentAdvance Directive Healthcare Power of [place];Living will  Does patient want to make changes to medical advance directive? No - Patient declined  Copy of Healthcare Power of Attorney in Chart? Yes  Would patient like information on creating a medical advance directive? -  Pre-existing out of facility DNR order (yellow form or pink MOST form) -     Chief Complaint  Patient presents with  . Medical Management of Chronic Issues    4mth follow-up    HPI: Patient is a 78 y.o. male seen today for medical management of chronic diseases.    Nosebleeds.  If he blows his nose sometimes, it starts to bleed from the left side.  Uses the ocean nasal spray.  If puts his finger inside of his nose, sometimes it will start.  He's keeping afrin around in case.  Happened in the waiting room.  He did have his nose cauterized in the past.  Had an episode before church over the weekend.  Once it stops, it's ok.  It's been going on off and on for 2 weeks.    He's been eating fun size 3 musketeers since halloween.  He's been pretty good about avoiding pepsis.    Past Medical History:  Diagnosis Date  . Abdominal pain, other specified site   . Backache, unspecified   . Carpal tunnel syndrome   . Essential hypertension, benign   . Impacted cerumen   . Impaired fasting glucose   . Other abnormal blood chemistry   . Pain in joint, lower leg   . Special screening for malignant neoplasm of prostate   . Unspecified essential hypertension   . Unspecified hypothyroidism     Past Surgical History:  Procedure Laterality Date  . (L) KNEE SURGERY"TORN MENISCUS"    . EYE SURGERY Left 11/24/2014   cataracts    Allergies  Allergen Reactions  . Penicillins Swelling  . Shellfish Allergy Swelling    Outpatient Encounter Medications  as of 01/13/2019  Medication Sig  . aspirin 81 MG tablet Take 81 mg by mouth daily.   . Aspirin-Salicylamide-Caffeine (BC HEADACHE POWDER PO) Take by mouth as needed.  [name]. atorvastatin (LIPITOR) 40 MG tablet TAKE 1 TABLET DAILY TO LOWER CHOLESTEROL  . hydrochlorothiazide (HYDRODIURIL) 25 MG tablet TAKE ONE AND ONE-HALF TABLETS DAILY  . latanoprost (XALATAN) 0.005 % ophthalmic solution Place 1 drop into both eyes at bedtime.  . metFORMIN (GLUCOPHAGE) 500 MG tablet TAKE 1 TABLET IN THE MORNING AND TAKE ONE-HALF (1/2) TABLET IN THE EVENING  . Multiple Vitamins-Minerals (MULTIVITAMIN & MINERAL PO) Take 1 tablet by mouth daily.   . sildenafil (VIAGRA) 50 MG tablet Take 1 tablet (50 mg total) by mouth daily as needed for erectile dysfunction.   No facility-administered encounter medications on file as of 01/13/2019.     Review of Systems:  Review of Systems  Constitutional: Negative for chills, fever and malaise/fatigue.  HENT: Negative for hearing loss.   Eyes: Negative for blurred vision.       Reading glasses, glaucoma  Respiratory: Negative for shortness of breath.   Cardiovascular: Negative for chest pain, palpitations and leg swelling.  Gastrointestinal: Negative for abdominal pain, blood in stool, constipation and melena.  Genitourinary: Negative for dysuria.  Musculoskeletal: Negative for falls, joint pain and myalgias.  Skin: Negative for itching  and rash.  Neurological: Negative for dizziness and loss of consciousness.  Endo/Heme/Allergies:       Epistaxis    Health Maintenance  Topic Date Due  . FOOT EXAM  12/17/2018  . OPHTHALMOLOGY EXAM  03/29/2019  . HEMOGLOBIN A1C  07/07/2019  . URINE MICROALBUMIN  09/10/2019  . TETANUS/TDAP  12/27/2027  . INFLUENZA VACCINE  Completed  . PNA vac Low Risk Adult  Completed    Physical Exam: Vitals:   01/13/19 1126  BP: 120/70  Pulse: 68  Temp: 97.8 F (36.6 C)  TempSrc: Oral  SpO2: 93%  Weight: 142 lb (64.4 kg)  Height: 5\' 6"   (1.676 m)   Body mass index is 22.92 kg/m. Physical Exam Constitutional:      Appearance: Normal appearance.  HENT:     Head: Normocephalic and atraumatic.     Comments: Refused to allow me to check inside his nose b/c he did not have more afrin with him Cardiovascular:     Rate and Rhythm: Normal rate and regular rhythm.     Pulses: Normal pulses.     Heart sounds: Normal heart sounds.  Pulmonary:     Effort: Pulmonary effort is normal.     Breath sounds: Normal breath sounds.  Skin:    General: Skin is warm and dry.  Neurological:     General: No focal deficit present.     Mental Status: He is alert and oriented to person, place, and time.  Psychiatric:        Mood and Affect: Mood normal.        Behavior: Behavior normal.        Thought Content: Thought content normal.        Judgment: Judgment normal.     Labs reviewed: Basic Metabolic Panel: Recent Labs    04/15/18 0933 09/05/18 0944 01/06/19 0833  NA 139 140 141  K 3.7 3.7 3.8  CL 101 101 103  CO2 32 31 27  GLUCOSE 96 96 97  BUN 11 11 11   CREATININE 0.92 0.94 1.00  CALCIUM 9.5 9.8 9.0  TSH  --   --  3.10   Liver Function Tests: Recent Labs    01/06/19 0833  AST 22  ALT 18  BILITOT 0.3  PROT 6.5   No results for input(s): LIPASE, AMYLASE in the last 8760 hours. No results for input(s): AMMONIA in the last 8760 hours. CBC: Recent Labs    04/15/18 0933 01/06/19 0833  WBC 8.0 9.3  NEUTROABS 4,904 6,584  HGB 15.4 16.2  HCT 45.0 46.6  MCV 88.6 88.6  PLT 292 283   Lipid Panel: Recent Labs    04/15/18 0933  CHOL 131  HDL 49  LDLCALC 64  TRIG 97  CHOLHDL 2.7   Lab Results  Component Value Date   HGBA1C 6.0 (H) 01/06/2019    Procedures since last visit: No results found.  Assessment/Plan 1. Epistaxis -off and on for 2 wks--episodes respond to afrin, but keep recurring w/o significant trauma - Ambulatory referral to ENT - CBC with Differential/Platelet; Future  2. Controlled  type 2 diabetes mellitus with diabetic polyneuropathy, without long-term current use of insulin (HCC) - well controlled, but trended up - Hemoglobin A1c; Future  3. Hyperlipidemia associated with type 2 diabetes mellitus (HCC) -cont current regimen and avoid 3 musketeers  4. Essential hypertension, benign - bp at goal with current regimen, cont same - Basic metabolic panel; Future  5. Hypothyroidism, unspecified type - has been  stable  6. Abdominal aortic atherosclerosis (HCC) -cont bp and lipid control, glucose control to prevent worsening  Labs/tests ordered:   Orders Placed This Encounter  Procedures  . Basic metabolic panel    Standing Status:   Future    Standing Expiration Date:   01/14/2020    Order Specific Question:   Has the patient fasted?    Answer:   Yes  . Hemoglobin A1c    Standing Status:   Future    Standing Expiration Date:   01/14/2020  . CBC with Differential/Platelet    Standing Status:   Future    Standing Expiration Date:   01/14/2020  . Ambulatory referral to ENT    Referral Priority:   Routine    Referral Type:   Consultation    Referral Reason:   Specialty Services Required    Requested Specialty:   Otolaryngology    Number of Visits Requested:   1    Next appt:  02/17/2019  Maryan Sivak L. Salaam Battershell, D.O. Geriatrics [facility] [address] [place], [place] [address] Cell Phone (Mon-Fri 8am-5pm):  [phone] On Call:  [phone] & follow prompts after 5pm & weekends Office Phone:  [phone] Office Fax:  [phone]

## 2019-02-01 ENCOUNTER — Other Ambulatory Visit: Payer: Self-pay | Admitting: Internal Medicine

## 2019-02-13 DIAGNOSIS — M19011 Primary osteoarthritis, right shoulder: Secondary | ICD-10-CM | POA: Diagnosis not present

## 2019-02-13 DIAGNOSIS — M75101 Unspecified rotator cuff tear or rupture of right shoulder, not specified as traumatic: Secondary | ICD-10-CM | POA: Diagnosis not present

## 2019-02-17 ENCOUNTER — Ambulatory Visit: Payer: Self-pay

## 2019-02-17 ENCOUNTER — Encounter: Payer: Self-pay | Admitting: Family

## 2019-02-18 ENCOUNTER — Encounter: Payer: Self-pay | Admitting: Family

## 2019-02-18 ENCOUNTER — Ambulatory Visit (INDEPENDENT_AMBULATORY_CARE_PROVIDER_SITE_OTHER): Payer: Medicare HMO | Admitting: Family

## 2019-02-18 VITALS — BP 138/70 | HR 78 | Temp 97.7°F | Ht 66.0 in | Wt 142.8 lb

## 2019-02-18 DIAGNOSIS — H401112 Primary open-angle glaucoma, right eye, moderate stage: Secondary | ICD-10-CM | POA: Diagnosis not present

## 2019-02-18 DIAGNOSIS — H21562 Pupillary abnormality, left eye: Secondary | ICD-10-CM | POA: Diagnosis not present

## 2019-02-18 DIAGNOSIS — H40052 Ocular hypertension, left eye: Secondary | ICD-10-CM | POA: Diagnosis not present

## 2019-02-18 DIAGNOSIS — Z0000 Encounter for general adult medical examination without abnormal findings: Secondary | ICD-10-CM

## 2019-02-18 DIAGNOSIS — H402222 Chronic angle-closure glaucoma, left eye, moderate stage: Secondary | ICD-10-CM | POA: Diagnosis not present

## 2019-02-18 LAB — HM DIABETES EYE EXAM

## 2019-02-18 NOTE — Progress Notes (Signed)
Subjective:   Tyler Hull is a 78 y.o. male who presents for Medicare Annual/Subsequent preventive examination.  Review of Systems:   Cardiac Risk Factors include: diabetes mellitus;hypertension;male gender;advanced age (>84men, >70 women);dyslipidemia     Objective:    Vitals: BP 138/70   Pulse 78   Temp 97.7 F (36.5 C) (Oral)   Ht 5\' 6"  (1.676 m)   Wt 142 lb 12.8 oz (64.8 kg)   SpO2 96%   BMI 23.05 kg/m   Body mass index is 23.05 kg/m.  Advanced Directives 02/18/2019 09/09/2018 07/10/2018 04/18/2018 02/12/2018 06/16/2017 06/04/2017  Does Patient Have a Medical Advance Directive? Yes Yes Yes Yes Yes Yes No  Type of Advance Directive Living will Healthcare Power of [place];Living will Healthcare Power of [place];Living will Living will Healthcare Power of [place];Living will Healthcare Power of [place];Living will -  Does patient want to make changes to medical advance directive? No - Patient declined No - Patient declined - No - Patient declined No - Patient declined - -  Copy of Healthcare Power of Attorney in Chart? - Yes Yes - Yes Yes -  Would patient like information on creating a medical advance directive? - - - No - Patient declined - No - Patient declined No - Patient declined  Pre-existing out of facility DNR order (yellow form or pink MOST form) - - - - - - -    Tobacco Social History   Tobacco Use  Smoking Status Former Smoker  . Years: 13.00  . Types: Cigarettes  . Last attempt to quit: 01/02/1966  . Years since quitting: 53.1  Smokeless Tobacco Never Used  Tobacco Comment   Quit at age 39      Counseling given: Not Answered Comment: Quit at age 57    Clinical Intake:  Pre-visit preparation completed: No  Pain : No/denies pain     BMI - recorded: 23.05 Nutritional Status: BMI of 19-24  Normal Nutritional Risks: None Diabetes: No  How often do you need to have someone help you when you read instructions, pamphlets, or other written materials  from your doctor or pharmacy?: 1 - Never What is the last grade level you completed in school?: 12 grade   Interpreter Needed?: No  Information entered by :: Jeffery Gammell FNP-C   Past Medical History:  Diagnosis Date  . Abdominal pain, other specified site   . Backache, unspecified   . Carpal tunnel syndrome   . Diabetes mellitus without complication (HCC)   . Essential hypertension, benign   . Hyperlipidemia   . Impacted cerumen   . Impaired fasting glucose   . Other abnormal blood chemistry   . Pain in joint, lower leg   . Special screening for malignant neoplasm of prostate   . Unspecified essential hypertension   . Unspecified hypothyroidism    Past Surgical History:  Procedure Laterality Date  . (L) KNEE SURGERY"TORN MENISCUS"    . EYE SURGERY Left 11/24/2014   cataracts   Family History  Problem Relation Age of Onset  . Alzheimer's disease Sister   . Cancer Brother 60       lung  . Diabetes Sister   . Diabetes Sister   . Lung cancer Sister 77       mets to back  . Kidney disease Sister   . Allergies Sister   . Obesity Sister   . Cancer Mother    Social History   Socioeconomic History  . Marital status: Married  Spouse name: Not on file  . Number of children: Not on file  . Years of education: Not on file  . Highest education level: Not on file  Occupational History  . Not on file  Social Needs  . Financial resource strain: Not hard at all  . Food insecurity:    Worry: Never true    Inability: Never true  . Transportation needs:    Medical: No    Non-medical: No  Tobacco Use  . Smoking status: Former Smoker    Years: 13.00    Types: Cigarettes    Last attempt to quit: 01/02/1966    Years since quitting: 53.1  . Smokeless tobacco: Never Used  . Tobacco comment: Quit at age 54   Substance and Sexual Activity  . Alcohol use: No  . Drug use: No  . Sexual activity: Yes  Lifestyle  . Physical activity:    Days per week: 3 days    Minutes per  session: 40 min  . Stress: Not at all  Relationships  . Social connections:    Talks on phone: More than three times a week    Gets together: Once a week    Attends religious service: More than 4 times per year    Active member of club or organization: No    Attends meetings of clubs or organizations: Never    Relationship status: Married  Other Topics Concern  . Not on file  Social History Narrative  . Not on file    Outpatient Encounter Medications as of 02/18/2019  Medication Sig  . aspirin 81 MG tablet Take 81 mg by mouth daily.   . Aspirin-Salicylamide-Caffeine (BC HEADACHE POWDER PO) Take by mouth as needed.  [name] atorvastatin (LIPITOR) 40 MG tablet TAKE 1 TABLET DAILY TO LOWER CHOLESTEROL  . hydrochlorothiazide (HYDRODIURIL) 25 MG tablet TAKE ONE AND ONE-HALF TABLETS DAILY  . latanoprost (XALATAN) 0.005 % ophthalmic solution Place 1 drop into both eyes at bedtime.  . metFORMIN (GLUCOPHAGE) 500 MG tablet TAKE 1 TABLET IN THE MORNING AND TAKE ONE-HALF (1/2) TABLET IN THE EVENING  . Multiple Vitamins-Minerals (MULTIVITAMIN & MINERAL PO) Take 1 tablet by mouth daily.   . sildenafil (VIAGRA) 50 MG tablet Take 1 tablet (50 mg total) by mouth daily as needed for erectile dysfunction.   No facility-administered encounter medications on file as of 02/18/2019.     Activities of Daily Living In your present state of health, do you have any difficulty performing the following activities: 02/18/2019  Hearing? N  Vision? N  Difficulty concentrating or making decisions? N  Walking or climbing stairs? N  Dressing or bathing? N  Doing errands, shopping? N  Preparing Food and eating ? Y  Comment wife does all the cooking   Using the Toilet? N  In the past six months, have you accidently leaked urine? N  Do you have problems with loss of bowel control? N  Managing your Medications? N  Managing your Finances? N  Housekeeping or managing your Housekeeping? N  Some recent data might be hidden      Patient Care Team: Kermit Balo, DO as PCP - General (Geriatric Medicine) [name], MD as Consulting Physician (Ophthalmology) [name], MD as Consulting Physician (Gastroenterology)   Assessment:   This is a routine wellness examination for Tyler Hull.  Exercise Activities and Dietary recommendations Current Exercise Habits: Structured exercise class, Type of exercise: yoga;stretching;strength training/weights, Time (Minutes): 45, Frequency (Times/Week): 2, Weekly Exercise (Minutes/Week): 90, Intensity:  Mild  Goals    . Increase water intake     Starting 02/08/17, I will attempt to increase my water intake from 2 bottles per day to 5 bottles per day.        Fall Risk Fall Risk  02/18/2019 01/13/2019 09/09/2018 07/10/2018 04/18/2018  Falls in the past year? 0 0 No No No  Number falls in past yr: 0 0 - - -  Injury with Fall? 0 0 - - -   Is the patient's home free of loose throw rugs in walkways, pet beds, electrical cords, etc?   yes      Grab bars in the bathroom? yes      Handrails on the stairs?   no      Adequate lighting?   yes  Depression Screen PHQ 2/9 Scores 02/18/2019 01/13/2019 09/09/2018 04/18/2018  PHQ - 2 Score 0 0 0 0    Cognitive Function MMSE - Mini Mental State Exam 02/18/2019 02/12/2018 02/08/2017 01/03/2016  Orientation to time Orientation to Place Registration Attention/ Calculation Recall Language- name 2 objects Language- repeat Language- follow 3 step command Language- read & follow direction Write a sentence Copy design Total score Immunization History  Administered Date(s) Administered  . Influenza, High Dose Seasonal PF 08/30/2017, 09/09/2018  . Influenza,inj,Quad PF,6+ Mos 08/31/2014, 09/30/2015, 08/03/2016  . Influenza-Unspecified 09/17/2012, 09/17/2013  . Pneumococcal Conjugate-13 11/26/2014, 11/27/2014  . Pneumococcal  Polysaccharide-23 12/03/2007  . Td 12/03/2007  . Tdap 12/26/2017  . Zoster 03/15/2013  . Zoster Recombinat (Shingrix) 06/01/2017, 11/14/2017    Qualifies for Shingles Vaccine? Up to date   Screening Tests Health Maintenance  Topic Date Due  . FOOT EXAM  12/17/2018  . OPHTHALMOLOGY EXAM  03/29/2019  . HEMOGLOBIN A1C  07/07/2019  . URINE MICROALBUMIN  09/10/2019  . TETANUS/TDAP  12/27/2027  . INFLUENZA VACCINE  Completed  . PNA vac Low Risk Adult  Completed   Cancer Screenings: Lung: Low Dose CT Chest recommended if Age 110-80 years, 30 pack-year currently smoking OR have quit w/in 15years. Patient does not qualify. Colorectal: up to date 3 years ago   Additional Screenings:  Hepatitis C Screening: low risk      Plan:  - low carbohydrate,low saturated fats,high vegetable diet and exercise recommended   I have personally reviewed and noted the following in the patient's chart:   . Medical and social history . Use of alcohol, tobacco or illicit drugs  . Current medications and supplements . Functional ability and status . Nutritional status . Physical activity . Advanced directives . List of other physicians . Hospitalizations, surgeries, and ER visits in previous 12 months . Vitals . Screenings to include cognitive, depression, and falls . Referrals and appointments  In addition, I have reviewed and discussed with patient certain preventive protocols, quality metrics, and best practice recommendations. A written personalized care plan for preventive services as well as general preventive health recommendations were provided to patient.    Caesar Bookman, NP  02/18/2019

## 2019-02-18 NOTE — Patient Instructions (Addendum)
[name] , Thank you for taking time to come for your Medicare Wellness Visit. I appreciate your ongoing commitment to your health goals. Please review the following plan we discussed and let me know if I can assist you in the future.   Screening recommendations/referrals: Colonoscopy: up to date  Recommended yearly ophthalmology/optometry visit for glaucoma screening and checkup Recommended yearly dental visit for hygiene and checkup  Vaccinations: Influenza vaccine: up to date  Pneumococcal vaccine: up to date  Tdap vaccine: Last given 12/26/2017 due in 210 years  Shingles vaccine: up to date     Advanced directives:copy in chart   Conditions/risks identified: advance age> 55 years,type 2 DM,Hypertension,male Gender and Hyperlipidemia  Next appointment: 1 year   Preventive Care 77 Years and Older, Male Preventive care refers to lifestyle choices and visits with your health care provider that can promote health and wellness. What does preventive care include?  A yearly physical exam. This is also called an annual well check.  Dental exams once or twice a year.  Routine eye exams. Ask your health care provider how often you should have your eyes checked.  Personal lifestyle choices, including:  Daily care of your teeth and gums.  Regular physical activity.  Eating a healthy diet.  Avoiding tobacco and drug use.  Limiting alcohol use.  Practicing safe sex.  Taking low doses of aspirin every day.  Taking vitamin and mineral supplements as recommended by your health care provider. What happens during an annual well check? The services and screenings done by your health care provider during your annual well check will depend on your age, overall health, lifestyle risk factors, and family history of disease. Counseling  Your health care provider may ask you questions about your:  Alcohol use.  Tobacco use.  Drug use.  Emotional well-being.  Home and relationship  well-being.  Sexual activity.  Eating habits.  History of falls.  Memory and ability to understand (cognition).  Work and work Astronomer. Screening  You may have the following tests or measurements:  Height, weight, and BMI.  Blood pressure.  Lipid and cholesterol levels. These may be checked every 5 years, or more frequently if you are over 77 years old.  Skin check.  Lung cancer screening. You may have this screening every year starting at age 77 if you have a 30-pack-year history of smoking and currently smoke or have quit within the past 15 years.  Fecal occult blood test (FOBT) of the stool. You may have this test every year starting at age 77.  Flexible sigmoidoscopy or colonoscopy. You may have a sigmoidoscopy every 5 years or a colonoscopy every 10 years starting at age 45.  Prostate cancer screening. Recommendations will vary depending on your family history and other risks.  Hepatitis C blood test.  Hepatitis B blood test.  Sexually transmitted disease (STD) testing.  Diabetes screening. This is done by checking your blood sugar (glucose) after you have not eaten for a while (fasting). You may have this done every 1-3 years.  Abdominal aortic aneurysm (AAA) screening. You may need this if you are a current or former smoker.  Osteoporosis. You may be screened starting at age 58 if you are at high risk. Talk with your health care provider about your test results, treatment options, and if necessary, the need for more tests. Vaccines  Your health care provider may recommend certain vaccines, such as:  Influenza vaccine. This is recommended every year.  Tetanus, diphtheria, and acellular  pertussis (Tdap, Td) vaccine. You may need a Td booster every 10 years.  Zoster vaccine. You may need this after age 45.  Pneumococcal 13-valent conjugate (PCV13) vaccine. One dose is recommended after age 23.  Pneumococcal polysaccharide (PPSV23) vaccine. One dose is  recommended after age 37. Talk to your health care provider about which screenings and vaccines you need and how often you need them. This information is not intended to replace advice given to you by your health care provider. Make sure you discuss any questions you have with your health care provider. Document Released: 12/31/2015 Document Revised: 08/23/2016 Document Reviewed: 10/05/2015 Elsevier Interactive Patient Education  2017 [place] Prevention in the Home Falls can cause injuries. They can happen to people of all ages. There are many things you can do to make your home safe and to help prevent falls. What can I do on the outside of my home?  Regularly fix the edges of walkways and driveways and fix any cracks.  Remove anything that might make you trip as you walk through a door, such as a raised step or threshold.  Trim any bushes or trees on the path to your home.  Use bright outdoor lighting.  Clear any walking paths of anything that might make someone trip, such as rocks or tools.  Regularly check to see if handrails are loose or broken. Make sure that both sides of any steps have handrails.  Any raised decks and porches should have guardrails on the edges.  Have any leaves, snow, or ice cleared regularly.  Use sand or salt on walking paths during winter.  Clean up any spills in your garage right away. This includes oil or grease spills. What can I do in the bathroom?  Use night lights.  Install grab bars by the toilet and in the tub and shower. Do not use towel bars as grab bars.  Use non-skid mats or decals in the tub or shower.  If you need to sit down in the shower, use a plastic, non-slip stool.  Keep the floor dry. Clean up any water that spills on the floor as soon as it happens.  Remove soap buildup in the tub or shower regularly.  Attach bath mats securely with double-sided non-slip rug tape.  Do not have throw rugs and other things on  the floor that can make you trip. What can I do in the bedroom?  Use night lights.  Make sure that you have a light by your bed that is easy to reach.  Do not use any sheets or blankets that are too big for your bed. They should not hang down onto the floor.  Have a firm chair that has side arms. You can use this for support while you get dressed.  Do not have throw rugs and other things on the floor that can make you trip. What can I do in the kitchen?  Clean up any spills right away.  Avoid walking on wet floors.  Keep items that you use a lot in easy-to-reach places.  If you need to reach something above you, use a strong step stool that has a grab bar.  Keep electrical cords out of the way.  Do not use floor polish or wax that makes floors slippery. If you must use wax, use non-skid floor wax.  Do not have throw rugs and other things on the floor that can make you trip. What can I do with my stairs?  Do not leave any items on the stairs.  Make sure that there are handrails on both sides of the stairs and use them. Fix handrails that are broken or loose. Make sure that handrails are as long as the stairways.  Check any carpeting to make sure that it is firmly attached to the stairs. Fix any carpet that is loose or worn.  Avoid having throw rugs at the top or bottom of the stairs. If you do have throw rugs, attach them to the floor with carpet tape.  Make sure that you have a light switch at the top of the stairs and the bottom of the stairs. If you do not have them, ask someone to add them for you. What else can I do to help prevent falls?  Wear shoes that:  Do not have high heels.  Have rubber bottoms.  Are comfortable and fit you well.  Are closed at the toe. Do not wear sandals.  If you use a stepladder:  Make sure that it is fully opened. Do not climb a closed stepladder.  Make sure that both sides of the stepladder are locked into place.  Ask someone to  hold it for you, if possible.  Clearly mark and make sure that you can see:  Any grab bars or handrails.  First and last steps.  Where the edge of each step is.  Use tools that help you move around (mobility aids) if they are needed. These include:  Canes.  Walkers.  Scooters.  Crutches.  Turn on the lights when you go into a dark area. Replace any light bulbs as soon as they burn out.  Set up your furniture so you have a clear path. Avoid moving your furniture around.  If any of your floors are uneven, fix them.  If there are any pets around you, be aware of where they are.  Review your medicines with your doctor. Some medicines can make you feel dizzy. This can increase your chance of falling. Ask your doctor what other things that you can do to help prevent falls. This information is not intended to replace advice given to you by your health care provider. Make sure you discuss any questions you have with your health care provider. Document Released: 09/30/2009 Document Revised: 05/11/2016 Document Reviewed: 01/08/2015 Elsevier Interactive Patient Education  2017 [employer].

## 2019-02-19 ENCOUNTER — Encounter: Payer: Self-pay | Admitting: *Deleted

## 2019-03-10 ENCOUNTER — Other Ambulatory Visit: Payer: Self-pay | Admitting: Internal Medicine

## 2019-03-10 NOTE — Telephone Encounter (Signed)
Refill request completed and sent to requested pharmacy.  Had an office visit 12/2018 and an annual wellness visit 02/2019.  January note stated to continue current regimen.

## 2019-05-13 ENCOUNTER — Other Ambulatory Visit: Payer: Self-pay

## 2019-05-13 ENCOUNTER — Other Ambulatory Visit: Payer: Medicare HMO

## 2019-05-13 DIAGNOSIS — I10 Essential (primary) hypertension: Secondary | ICD-10-CM

## 2019-05-13 DIAGNOSIS — R040 Epistaxis: Secondary | ICD-10-CM | POA: Diagnosis not present

## 2019-05-13 DIAGNOSIS — E1142 Type 2 diabetes mellitus with diabetic polyneuropathy: Secondary | ICD-10-CM | POA: Diagnosis not present

## 2019-05-14 LAB — CBC WITH DIFFERENTIAL/PLATELET
Absolute Monocytes: 937 cells/uL (ref 200–950)
Basophils Absolute: 55 cells/uL (ref 0–200)
Basophils Relative: 0.5 %
Eosinophils Absolute: 185 cells/uL (ref 15–500)
Eosinophils Relative: 1.7 %
HCT: 45.5 % (ref 38.5–50.0)
Hemoglobin: 15.3 g/dL (ref 13.2–17.1)
Lymphs Abs: 2954 cells/uL (ref 850–3900)
MCH: 30.3 pg (ref 27.0–33.0)
MCHC: 33.6 g/dL (ref 32.0–36.0)
MCV: 90.1 fL (ref 80.0–100.0)
MPV: 11 fL (ref 7.5–12.5)
Monocytes Relative: 8.6 %
Neutro Abs: 6769 cells/uL (ref 1500–7800)
Neutrophils Relative %: 62.1 %
Platelets: 295 10*3/uL (ref 140–400)
RBC: 5.05 10*6/uL (ref 4.20–5.80)
RDW: 12.6 % (ref 11.0–15.0)
Total Lymphocyte: 27.1 %
WBC: 10.9 10*3/uL — ABNORMAL HIGH (ref 3.8–10.8)

## 2019-05-14 LAB — BASIC METABOLIC PANEL
BUN: 13 mg/dL (ref 7–25)
CO2: 32 mmol/L (ref 20–32)
Calcium: 10 mg/dL (ref 8.6–10.3)
Chloride: 101 mmol/L (ref 98–110)
Creat: 0.95 mg/dL (ref 0.70–1.18)
Glucose, Bld: 101 mg/dL — ABNORMAL HIGH (ref 65–99)
Potassium: 3.8 mmol/L (ref 3.5–5.3)
Sodium: 140 mmol/L (ref 135–146)

## 2019-05-14 LAB — HEMOGLOBIN A1C
Hgb A1c MFr Bld: 5.9 % of total Hgb — ABNORMAL HIGH (ref <5.7)
Mean Plasma Glucose: 123 (calc)
eAG (mmol/L): 6.8 (calc)

## 2019-05-15 ENCOUNTER — Other Ambulatory Visit: Payer: Self-pay

## 2019-05-15 ENCOUNTER — Ambulatory Visit (INDEPENDENT_AMBULATORY_CARE_PROVIDER_SITE_OTHER): Payer: Medicare HMO | Admitting: Internal Medicine

## 2019-05-15 ENCOUNTER — Encounter: Payer: Self-pay | Admitting: Internal Medicine

## 2019-05-15 VITALS — BP 121/59 | HR 86 | Temp 98.0°F | Wt 141.0 lb

## 2019-05-15 DIAGNOSIS — D72828 Other elevated white blood cell count: Secondary | ICD-10-CM | POA: Diagnosis not present

## 2019-05-15 DIAGNOSIS — D72829 Elevated white blood cell count, unspecified: Secondary | ICD-10-CM | POA: Insufficient documentation

## 2019-05-15 DIAGNOSIS — E785 Hyperlipidemia, unspecified: Secondary | ICD-10-CM | POA: Diagnosis not present

## 2019-05-15 DIAGNOSIS — E1169 Type 2 diabetes mellitus with other specified complication: Secondary | ICD-10-CM | POA: Diagnosis not present

## 2019-05-15 DIAGNOSIS — E039 Hypothyroidism, unspecified: Secondary | ICD-10-CM

## 2019-05-15 DIAGNOSIS — E1142 Type 2 diabetes mellitus with diabetic polyneuropathy: Secondary | ICD-10-CM | POA: Diagnosis not present

## 2019-05-15 DIAGNOSIS — I10 Essential (primary) hypertension: Secondary | ICD-10-CM

## 2019-05-15 NOTE — Progress Notes (Signed)
Patient ID: Tyler Hull, male   DOB: [birth year], 78 y.o.   MRN: [id number] This service is provided via telemedicine  No vital signs collected/recorded due to the encounter was a telemedicine visit.    Location of patient (ex: home, work):  HOME   Patient consents to a telephone visit:  YES  Location of the provider (ex: office, home):  OFFICE  Name of any referring provider:  Latamara Melder, DO  Names of all persons participating in the telemedicine service and their role in the encounter:  PATIENT, DESHANNON SMITH CMA, Seraphine Gudiel DO  Time spent on call:  4:55    Location:  [facility] clinic Provider:  Brysun Eschmann L. Renato Gails, D.O., C.M.D.  Code Status: DNR Goals of Care:  Advanced Directives 05/15/2019  Does Patient Have a Medical Advance Directive? Yes  Type of Advance Directive Living will  Does patient want to make changes to medical advance directive? No - Patient declined  Copy of Healthcare Power of Attorney in Chart? -  Would patient like information on creating a medical advance directive? -  Pre-existing out of facility DNR order (yellow form or pink MOST form) -     Chief Complaint  Patient presents with  . Medical Management of Chronic Issues    follow-up    HPI: Patient is a 78 y.o. male seen today for medical management of chronic diseases.    They are staying in per the guidelines--they only go out to shop during the senior times.  He likes how [employer] makes everybody wear a mask.    hba1c 5.9 which is improved a little.  He has not had a pepsi.  He's been drinking some juices and Nita's encouraging him to drink more water.    He took his vitals at home with his wife's encouragement.  They are at goal.    He has 10.9 WBC.  He says he's blowing his nose in the morning--clear.  No urinary changes--it's yellow.    One day 2 weeks ago, he went to pick up something off the floor, it was sticky and he went to stand up.  There was pins and needles by his thumb  afterwards.  It felt like he had accupuncture there.  It resolved with shaking his hand before even standing.    Has some of the usual pain in his shoulder where he gets the shots.  He's putting up with that.  Only bothers when he's trying to sleep on it.  Lower left butt still bothers him too sometimes when trying to sleep.  He has to address it with Dr. Shon Baton.    They've been taking b12 daily--both he and his wife.    Past Medical History:  Diagnosis Date  . Abdominal pain, other specified site   . Backache, unspecified   . Carpal tunnel syndrome   . Diabetes mellitus without complication (HCC)   . Essential hypertension, benign   . Hyperlipidemia   . Impacted cerumen   . Impaired fasting glucose   . Other abnormal blood chemistry   . Pain in joint, lower leg   . Special screening for malignant neoplasm of prostate   . Unspecified essential hypertension   . Unspecified hypothyroidism     Past Surgical History:  Procedure Laterality Date  . (L) KNEE SURGERY"TORN MENISCUS"    . EYE SURGERY Left 11/24/2014   cataracts    Allergies  Allergen Reactions  . Penicillins Swelling  . Shellfish Allergy Swelling  Outpatient Encounter Medications as of 05/15/2019  Medication Sig  . aspirin 81 MG tablet Take 81 mg by mouth daily.   . Aspirin-Salicylamide-Caffeine (BC HEADACHE POWDER PO) Take by mouth as needed.  [name]. atorvastatin (LIPITOR) 40 MG tablet TAKE 1 TABLET DAILY TO LOWER CHOLESTEROL  . hydrochlorothiazide (HYDRODIURIL) 25 MG tablet TAKE ONE AND ONE-HALF TABLETS DAILY  . latanoprost (XALATAN) 0.005 % ophthalmic solution Place 1 drop into both eyes at bedtime.  . metFORMIN (GLUCOPHAGE) 500 MG tablet TAKE 1 TABLET IN THE MORNING AND TAKE ONE-HALF (1/2) TABLET IN THE EVENING  . Multiple Vitamins-Minerals (MULTIVITAMIN & MINERAL PO) Take 1 tablet by mouth daily.   . sildenafil (VIAGRA) 50 MG tablet Take 1 tablet (50 mg total) by mouth daily as needed for erectile  dysfunction.  . vitamin B-12 (CYANOCOBALAMIN) 1000 MCG tablet Take 500 mcg by mouth daily.    No facility-administered encounter medications on file as of 05/15/2019.     Review of Systems:  Review of Systems  Constitutional: Negative for chills, fever and malaise/fatigue.  HENT: Negative for hearing loss.   Eyes:       Glaucoma  Respiratory: Negative for cough and shortness of breath.   Cardiovascular: Negative for chest pain, palpitations and leg swelling.  Gastrointestinal: Negative for abdominal pain, blood in stool, constipation, diarrhea and melena.  Genitourinary: Negative for dysuria.  Musculoskeletal: Positive for joint pain. Negative for falls.  Skin: Negative for itching and rash.  Neurological: Negative for dizziness and loss of consciousness.  Endo/Heme/Allergies: Does not bruise/bleed easily.  Psychiatric/Behavioral: Negative for depression and memory loss. The patient is not nervous/anxious and does not have insomnia.     Health Maintenance  Topic Date Due  . FOOT EXAM  12/17/2018  . INFLUENZA VACCINE  07/19/2019  . URINE MICROALBUMIN  09/10/2019  . HEMOGLOBIN A1C  11/13/2019  . OPHTHALMOLOGY EXAM  02/18/2020  . TETANUS/TDAP  12/27/2027  . PNA vac Low Risk Adult  Completed    Physical Exam: Vitals:   05/15/19 1406  BP: (!) 121/59  Pulse: 86  Temp: 98 F (36.7 C)  TempSrc: Oral  Weight: 141 lb (64 kg)   Body mass index is 22.76 kg/m. Physical Exam Vitals signs reviewed.  Neurological:     Mental Status: He is alert and oriented to person, place, and time.   Televisit so physical exam not performed  Labs reviewed: Basic Metabolic Panel: Recent Labs    09/05/18 0944 01/06/19 0833 05/13/19 0819  NA 140 141 140  K 3.7 3.8 3.8  CL 101 103 101  CO2 31 27 32  GLUCOSE 96 97 101*  BUN 11 11 13   CREATININE 0.94 1.00 0.95  CALCIUM 9.8 9.0 10.0  TSH  --  3.10  --    Liver Function Tests: Recent Labs    01/06/19 0833  AST 22  ALT 18  BILITOT  0.3  PROT 6.5   No results for input(s): LIPASE, AMYLASE in the last 8760 hours. No results for input(s): AMMONIA in the last 8760 hours. CBC: Recent Labs    01/06/19 0833 05/13/19 0819  WBC 9.3 10.9*  NEUTROABS 6,584 6,769  HGB 16.2 15.3  HCT 46.6 45.5  MCV 88.6 90.1  PLT 283 295   Lipid Panel: No results for input(s): CHOL, HDL, LDLCALC, TRIG, CHOLHDL, LDLDIRECT in the last 8760 hours. Lab Results  Component Value Date   HGBA1C 5.9 (H) 05/13/2019   Assessment/Plan 1. Controlled type 2 diabetes mellitus with diabetic polyneuropathy, without  long-term current use of insulin (HCC) - continue same regimen, healthy diet, avoid sodas, cut juice with water, exercise -hba1c improved - COMPLETE METABOLIC PANEL WITH GFR; Future - Hemoglobin A1c; Future  2. Hyperlipidemia associated with type 2 diabetes mellitus (HCC) - continue same regimen, healthy diet, exercise - COMPLETE METABOLIC PANEL WITH GFR; Future - Lipid panel; Future  3. Essential hypertension, benign -bp at goal with current regimen based on reading he took at home today - COMPLETE METABOLIC PANEL WITH GFR; Future  4. Hypothyroidism, unspecified type -cont current regimen, f/u lab before next visit - TSH; Future  5. Other elevated white blood cell (WBC) count - one other time, also elevated; no clear symptoms of infection (slight rhinorrhea) - CBC with Differential/Platelet; Future  Labs/tests ordered:   Orders Placed This Encounter  Procedures  . CBC with Differential/Platelet    Standing Status:   Future    Standing Expiration Date:   05/14/2020  . COMPLETE METABOLIC PANEL WITH GFR    Standing Status:   Future    Standing Expiration Date:   05/14/2020  . Hemoglobin A1c    Standing Status:   Future    Standing Expiration Date:   05/14/2020  . Lipid panel    Standing Status:   Future    Standing Expiration Date:   05/14/2020  . TSH    Standing Status:   Future    Standing Expiration Date:   05/14/2020     Next appt:  4 mos med mgt, fasting labs before 25 minutes spent   Vamsi Apfel L. Sabrinia Prien, D.O. Geriatrics [facility] [address] [place], [place] [address] Cell Phone (Mon-Fri 8am-5pm):  [phone] On Call:  [phone] & follow prompts after 5pm & weekends Office Phone:  [phone] Office Fax:  [phone]

## 2019-05-20 DIAGNOSIS — Z20828 Contact with and (suspected) exposure to other viral communicable diseases: Secondary | ICD-10-CM | POA: Diagnosis not present

## 2019-05-21 DIAGNOSIS — H40052 Ocular hypertension, left eye: Secondary | ICD-10-CM | POA: Diagnosis not present

## 2019-05-21 DIAGNOSIS — H401132 Primary open-angle glaucoma, bilateral, moderate stage: Secondary | ICD-10-CM | POA: Diagnosis not present

## 2019-05-22 ENCOUNTER — Other Ambulatory Visit: Payer: Self-pay | Admitting: Internal Medicine

## 2019-06-25 DIAGNOSIS — M19011 Primary osteoarthritis, right shoulder: Secondary | ICD-10-CM | POA: Diagnosis not present

## 2019-06-25 DIAGNOSIS — M75101 Unspecified rotator cuff tear or rupture of right shoulder, not specified as traumatic: Secondary | ICD-10-CM | POA: Diagnosis not present

## 2019-07-14 ENCOUNTER — Other Ambulatory Visit: Payer: Self-pay

## 2019-07-14 ENCOUNTER — Encounter: Payer: Medicare HMO | Admitting: Internal Medicine

## 2019-07-15 NOTE — Progress Notes (Signed)
This encounter was created in error - please disregard.

## 2019-08-19 DIAGNOSIS — M79602 Pain in left arm: Secondary | ICD-10-CM | POA: Diagnosis not present

## 2019-08-19 DIAGNOSIS — M79604 Pain in right leg: Secondary | ICD-10-CM | POA: Diagnosis not present

## 2019-09-01 ENCOUNTER — Other Ambulatory Visit: Payer: Medicare HMO

## 2019-09-01 ENCOUNTER — Other Ambulatory Visit: Payer: Self-pay

## 2019-09-01 DIAGNOSIS — D72828 Other elevated white blood cell count: Secondary | ICD-10-CM

## 2019-09-01 DIAGNOSIS — E1142 Type 2 diabetes mellitus with diabetic polyneuropathy: Secondary | ICD-10-CM | POA: Diagnosis not present

## 2019-09-01 DIAGNOSIS — I10 Essential (primary) hypertension: Secondary | ICD-10-CM

## 2019-09-01 DIAGNOSIS — E1169 Type 2 diabetes mellitus with other specified complication: Secondary | ICD-10-CM

## 2019-09-01 DIAGNOSIS — E785 Hyperlipidemia, unspecified: Secondary | ICD-10-CM

## 2019-09-01 DIAGNOSIS — E039 Hypothyroidism, unspecified: Secondary | ICD-10-CM | POA: Diagnosis not present

## 2019-09-02 LAB — COMPLETE METABOLIC PANEL WITH GFR
AG Ratio: 2.3 (calc) (ref 1.0–2.5)
ALT: 23 U/L (ref 9–46)
AST: 21 U/L (ref 10–35)
Albumin: 4.6 g/dL (ref 3.6–5.1)
Alkaline phosphatase (APISO): 76 U/L (ref 35–144)
BUN: 10 mg/dL (ref 7–25)
CO2: 32 mmol/L (ref 20–32)
Calcium: 9.9 mg/dL (ref 8.6–10.3)
Chloride: 102 mmol/L (ref 98–110)
Creat: 0.91 mg/dL (ref 0.70–1.18)
GFR, Est African American: 95 mL/min/{1.73_m2} (ref >=60)
GFR, Est Non African American: 82 mL/min/{1.73_m2} (ref >=60)
Globulin: 2 g/dL (calc) (ref 1.9–3.7)
Glucose, Bld: 102 mg/dL — ABNORMAL HIGH (ref 65–99)
Potassium: 3.9 mmol/L (ref 3.5–5.3)
Sodium: 141 mmol/L (ref 135–146)
Total Bilirubin: 0.7 mg/dL (ref 0.2–1.2)
Total Protein: 6.6 g/dL (ref 6.1–8.1)

## 2019-09-02 LAB — CBC WITH DIFFERENTIAL/PLATELET
Absolute Monocytes: 713 cells/uL (ref 200–950)
Basophils Absolute: 57 cells/uL (ref 0–200)
Basophils Relative: 0.6 %
Eosinophils Absolute: 314 cells/uL (ref 15–500)
Eosinophils Relative: 3.3 %
HCT: 45 % (ref 38.5–50.0)
Hemoglobin: 15.5 g/dL (ref 13.2–17.1)
Lymphs Abs: 2888 cells/uL (ref 850–3900)
MCH: 30.6 pg (ref 27.0–33.0)
MCHC: 34.4 g/dL (ref 32.0–36.0)
MCV: 88.9 fL (ref 80.0–100.0)
MPV: 11 fL (ref 7.5–12.5)
Monocytes Relative: 7.5 %
Neutro Abs: 5529 cells/uL (ref 1500–7800)
Neutrophils Relative %: 58.2 %
Platelets: 296 10*3/uL (ref 140–400)
RBC: 5.06 10*6/uL (ref 4.20–5.80)
RDW: 11.9 % (ref 11.0–15.0)
Total Lymphocyte: 30.4 %
WBC: 9.5 10*3/uL (ref 3.8–10.8)

## 2019-09-02 LAB — HEMOGLOBIN A1C
Hgb A1c MFr Bld: 6 % of total Hgb — ABNORMAL HIGH (ref <5.7)
Mean Plasma Glucose: 126 (calc)
eAG (mmol/L): 7 (calc)

## 2019-09-02 LAB — LIPID PANEL
Cholesterol: 134 mg/dL (ref <200)
HDL: 50 mg/dL (ref >=40)
LDL Cholesterol (Calc): 66 mg/dL (calc)
Non-HDL Cholesterol (Calc): 84 mg/dL (calc) (ref <130)
Total CHOL/HDL Ratio: 2.7 (calc) (ref <5.0)
Triglycerides: 98 mg/dL (ref <150)

## 2019-09-02 LAB — TSH: TSH: 4.01 mIU/L (ref 0.40–4.50)

## 2019-09-04 ENCOUNTER — Other Ambulatory Visit: Payer: Self-pay | Admitting: Internal Medicine

## 2019-09-04 DIAGNOSIS — N529 Male erectile dysfunction, unspecified: Secondary | ICD-10-CM

## 2019-09-08 ENCOUNTER — Ambulatory Visit: Payer: Medicare HMO | Admitting: Internal Medicine

## 2019-09-08 ENCOUNTER — Other Ambulatory Visit: Payer: Self-pay

## 2019-09-08 ENCOUNTER — Encounter: Payer: Self-pay | Admitting: Internal Medicine

## 2019-09-08 VITALS — BP 122/70 | HR 73 | Temp 98.3°F | Ht 66.0 in | Wt 143.8 lb

## 2019-09-08 DIAGNOSIS — E039 Hypothyroidism, unspecified: Secondary | ICD-10-CM | POA: Diagnosis not present

## 2019-09-08 DIAGNOSIS — E1169 Type 2 diabetes mellitus with other specified complication: Secondary | ICD-10-CM

## 2019-09-08 DIAGNOSIS — Z23 Encounter for immunization: Secondary | ICD-10-CM

## 2019-09-08 DIAGNOSIS — I10 Essential (primary) hypertension: Secondary | ICD-10-CM | POA: Diagnosis not present

## 2019-09-08 DIAGNOSIS — Z01818 Encounter for other preprocedural examination: Secondary | ICD-10-CM

## 2019-09-08 DIAGNOSIS — E785 Hyperlipidemia, unspecified: Secondary | ICD-10-CM

## 2019-09-08 DIAGNOSIS — N529 Male erectile dysfunction, unspecified: Secondary | ICD-10-CM | POA: Diagnosis not present

## 2019-09-08 DIAGNOSIS — E1142 Type 2 diabetes mellitus with diabetic polyneuropathy: Secondary | ICD-10-CM

## 2019-09-08 MED ORDER — SILDENAFIL CITRATE 50 MG PO TABS: 50.0000 mg | ORAL_TABLET | ORAL | 3 refills | Status: DC | PRN

## 2019-09-08 NOTE — Patient Instructions (Signed)
Best of luck with your surgery!

## 2019-09-08 NOTE — Progress Notes (Addendum)
Location:  [facility]   Advanced Directives 05/15/2019  Does Patient Have a Medical Advance Directive? Yes  Type of Advance Directive Living will  Does patient want to make changes to medical advance directive? No - Patient declined  Copy of Healthcare Power of Attorney in Chart? -  Would patient like information on creating a medical advance directive? -  Pre-existing out of facility DNR order (yellow form or pink MOST form) -     Chief Complaint  Patient presents with  . Medical Management of Chronic Issues    4 month followup, review of fasting labs, shoulder surgery scheduled for September 23, 2019. Accompanied by wife.  . Immunizations    Flu shot given today.  . Quality Metric Gaps    Foot exam    HPI: Patient is a 78 y.o. male seen today for medical clearance for orthopedic surgery and follow up of medical conditions.   Wife present during examination.  Lab results reviewed with patient.  He is scheduled for outpatient surgery to repair his right shoulder 09/23/2019 with Dr. Ranell Patrick. Has struggled with right shoulder pain for over 6 months and tried many interventions without success.   BP at goal today. He is taking his blood pressure medication daily. Diet is low in sodium. Walk a few times a week with his wife. Denies any chest pain, shortness of breath, headaches, or dizziness.   He continues to take his statin daily. He denies any side effects of the medication. Follows a diet low in fat and fried foods. His wife bought a [employer] and they use it once a week.   Continues to drink soda and juice a few times a week. He denies eating sugary foods, but enjoys a sweet drink.   No falls or recent injury.   Requesting flu vaccine    Past Medical History:  Diagnosis Date  . Abdominal pain, other specified site   . Backache, unspecified   . Carpal tunnel syndrome   . Diabetes mellitus without complication (HCC)   . Essential hypertension, benign   . Hyperlipidemia    . Impacted cerumen   . Impaired fasting glucose   . Other abnormal blood chemistry   . Pain in joint, lower leg   . Special screening for malignant neoplasm of prostate   . Unspecified essential hypertension   . Unspecified hypothyroidism     Past Surgical History:  Procedure Laterality Date  . (L) KNEE SURGERY"TORN MENISCUS"    . EYE SURGERY Left 11/24/2014   cataracts    Allergies  Allergen Reactions  . Penicillins Swelling  . Shellfish Allergy Swelling    Outpatient Encounter Medications as of 09/08/2019  Medication Sig  . aspirin 81 MG tablet Take 81 mg by mouth daily.   . Aspirin-Salicylamide-Caffeine (BC HEADACHE POWDER PO) Take by mouth as needed.  [name] atorvastatin (LIPITOR) 40 MG tablet TAKE 1 TABLET DAILY TO LOWER CHOLESTEROL  . hydrochlorothiazide (HYDRODIURIL) 25 MG tablet TAKE ONE AND ONE-HALF TABLETS DAILY  . latanoprost (XALATAN) 0.005 % ophthalmic solution Place 1 drop into both eyes at bedtime.  . metFORMIN (GLUCOPHAGE) 500 MG tablet TAKE 1 TABLET IN THE MORNING AND TAKE ONE-HALF (1/2) TABLET IN THE EVENING  . Multiple Vitamins-Minerals (MULTIVITAMIN & MINERAL PO) Take 1 tablet by mouth daily.   . sildenafil (VIAGRA) 50 MG tablet Take 1 tablet (50 mg total) by mouth as needed for erectile dysfunction.  . vitamin B-12 (CYANOCOBALAMIN) 1000 MCG tablet Take 500 mcg by mouth daily.   . [  DISCONTINUED] sildenafil (VIAGRA) 50 MG tablet TAKE 1 TABLET DAILY AS NEEDED FOR ERECTILE DYSFUNCTION   No facility-administered encounter medications on file as of 09/08/2019.     Review of Systems:  Review of Systems  Constitutional: Negative for activity change, appetite change and fatigue.  HENT: Negative for dental problem, sore throat and trouble swallowing.   Respiratory: Negative for cough, shortness of breath and wheezing.   Cardiovascular: Negative for chest pain, palpitations and leg swelling.  Gastrointestinal: Negative for abdominal pain, constipation, diarrhea and  nausea.  Endocrine: Negative for polydipsia, polyphagia and polyuria.  Genitourinary: Negative for dysuria and hematuria.  Musculoskeletal: Positive for arthralgias and myalgias.       Right shoulder pain  Skin: Negative.   Neurological: Negative for dizziness, weakness and headaches.  Psychiatric/Behavioral: Negative for dysphoric mood and sleep disturbance. The patient is not nervous/anxious.     Health Maintenance  Topic Date Due  . FOOT EXAM  12/17/2018  . INFLUENZA VACCINE  07/19/2019  . URINE MICROALBUMIN  09/10/2019  . OPHTHALMOLOGY EXAM  02/18/2020  . HEMOGLOBIN A1C  02/29/2020  . TETANUS/TDAP  12/27/2027  . PNA vac Low Risk Adult  Completed    Physical Exam: Vitals:   09/08/19 1136  BP: 122/70  Pulse: 73  Temp: 98.3 F (36.8 C)  TempSrc: Oral  SpO2: 94%  Weight: 143 lb 12.8 oz (65.2 kg)  Height: 5\' 6"  (1.676 m)   Body mass index is 23.21 kg/m. Physical Exam Vitals signs reviewed.  Constitutional:      General: He is not in acute distress.    Appearance: Normal appearance. He is normal weight.  HENT:     Head: Normocephalic.     Right Ear: There is no impacted cerumen.     Left Ear: There is no impacted cerumen.     Mouth/Throat:     Mouth: Mucous membranes are moist.  Neck:     Musculoskeletal: Normal range of motion and neck supple.     Thyroid: No thyroid mass or thyromegaly.     Vascular: No carotid bruit.  Cardiovascular:     Rate and Rhythm: Normal rate and regular rhythm.     Pulses: Normal pulses.     Heart sounds: Normal heart sounds. No murmur.  Pulmonary:     Effort: Pulmonary effort is normal. No respiratory distress.     Breath sounds: Normal breath sounds. No wheezing.  Chest:     Chest wall: No tenderness.  Abdominal:     General: Abdomen is flat. Bowel sounds are normal.     Palpations: Abdomen is soft.  Musculoskeletal:     Right lower leg: No edema.     Left lower leg: No edema.  Skin:    General: Skin is warm and dry.      Capillary Refill: Capillary refill takes less than 2 seconds.  Neurological:     General: No focal deficit present.     Mental Status: He is alert and oriented to person, place, and time. Mental status is at baseline.  Psychiatric:        Mood and Affect: Mood normal.        Behavior: Behavior normal.        Thought Content: Thought content normal.        Judgment: Judgment normal.     Labs reviewed: Basic Metabolic Panel: Recent Labs    01/06/19 0833 05/13/19 0819 09/01/19 0839  NA 141 140 141  K 3.8 3.8 3.9  CL 103 101 102  CO2 27 32 32  GLUCOSE 97 101* 102*  BUN 11 13 10   CREATININE 1.00 0.95 0.91  CALCIUM 9.0 10.0 9.9  TSH 3.10  --  4.01   Liver Function Tests: Recent Labs    01/06/19 0833 09/01/19 0839  AST 22 21  ALT 18 23  BILITOT 0.3 0.7  PROT 6.5 6.6   No results for input(s): LIPASE, AMYLASE in the last 8760 hours. No results for input(s): AMMONIA in the last 8760 hours. CBC: Recent Labs    01/06/19 0833 05/13/19 0819 09/01/19 0839  WBC 9.3 10.9* 9.5  NEUTROABS 6,584 6,769 5,529  HGB 16.2 15.3 15.5  HCT 46.6 45.5 45.0  MCV 88.6 90.1 88.9  PLT 283 295 296   Lipid Panel: Recent Labs    09/01/19 0839  CHOL 134  HDL 50  LDLCALC 66  TRIG 98  CHOLHDL 2.7   Lab Results  Component Value Date   HGBA1C 6.0 (H) 09/01/2019    Procedures since last visit: No results found.  Assessment/Plan 1. Preop exam for internal medicine - RCRI risk low  2. Erectile dysfunction, unspecified erectile dysfunction type -stable, continue current regimen   3. Controlled type 2 diabetes mellitus with diabetic polyneuropathy, without long-term current use of insulin (HCC) - stable, hemoglobin A1C 6.0 - continue current regimen of healthy diet and exercise - encouraged to reduce soda and juice intake - complete metabolic panel with GFR- future - hemoglobin A1C- future  4. Hyperlipidemia associated with type 2 diabetes mellitus ([place]) - continue to follow  a health diet low in fat and fried food - encourage light exercise - lipid panel- future  5. Essential hypertension, benign ([place]) - bp at goal with current medication therapy - complete metabolic panel with GFR- future  6. Need for influenza vaccination - administer flu quad (High Dose 65+) vaccination  7. Hypothyroidism, unspecified type - TSH 4.01- stable - TSH- future  Labs/tests ordered:  Complete blood count with differential/platelets, complete metabolic panel with GFR, hemoglobin A1C, TSH, lipid panel- future Next appt:  02/19/2020

## 2019-10-06 DIAGNOSIS — M19011 Primary osteoarthritis, right shoulder: Secondary | ICD-10-CM | POA: Diagnosis not present

## 2019-10-06 DIAGNOSIS — M65811 Other synovitis and tenosynovitis, right shoulder: Secondary | ICD-10-CM | POA: Diagnosis not present

## 2019-10-06 DIAGNOSIS — M7541 Impingement syndrome of right shoulder: Secondary | ICD-10-CM | POA: Diagnosis not present

## 2019-10-06 DIAGNOSIS — S46011A Strain of muscle(s) and tendon(s) of the rotator cuff of right shoulder, initial encounter: Secondary | ICD-10-CM | POA: Diagnosis not present

## 2019-10-06 DIAGNOSIS — G8918 Other acute postprocedural pain: Secondary | ICD-10-CM | POA: Diagnosis not present

## 2019-10-06 DIAGNOSIS — S43431A Superior glenoid labrum lesion of right shoulder, initial encounter: Secondary | ICD-10-CM | POA: Diagnosis not present

## 2019-10-06 DIAGNOSIS — X58XXXA Exposure to other specified factors, initial encounter: Secondary | ICD-10-CM | POA: Diagnosis not present

## 2019-10-06 DIAGNOSIS — M94211 Chondromalacia, right shoulder: Secondary | ICD-10-CM | POA: Diagnosis not present

## 2019-10-06 HISTORY — PX: SHOULDER SURGERY: SHX246

## 2019-10-09 DIAGNOSIS — M25611 Stiffness of right shoulder, not elsewhere classified: Secondary | ICD-10-CM | POA: Diagnosis not present

## 2019-10-09 DIAGNOSIS — M25511 Pain in right shoulder: Secondary | ICD-10-CM | POA: Diagnosis not present

## 2019-10-13 DIAGNOSIS — M25611 Stiffness of right shoulder, not elsewhere classified: Secondary | ICD-10-CM | POA: Diagnosis not present

## 2019-10-13 DIAGNOSIS — M25511 Pain in right shoulder: Secondary | ICD-10-CM | POA: Diagnosis not present

## 2019-10-21 DIAGNOSIS — M25611 Stiffness of right shoulder, not elsewhere classified: Secondary | ICD-10-CM | POA: Diagnosis not present

## 2019-10-21 DIAGNOSIS — M25511 Pain in right shoulder: Secondary | ICD-10-CM | POA: Diagnosis not present

## 2019-10-30 DIAGNOSIS — M25511 Pain in right shoulder: Secondary | ICD-10-CM | POA: Diagnosis not present

## 2019-10-30 DIAGNOSIS — M25611 Stiffness of right shoulder, not elsewhere classified: Secondary | ICD-10-CM | POA: Diagnosis not present

## 2019-11-06 DIAGNOSIS — M25611 Stiffness of right shoulder, not elsewhere classified: Secondary | ICD-10-CM | POA: Diagnosis not present

## 2019-11-06 DIAGNOSIS — M25511 Pain in right shoulder: Secondary | ICD-10-CM | POA: Diagnosis not present

## 2019-11-11 DIAGNOSIS — M25511 Pain in right shoulder: Secondary | ICD-10-CM | POA: Diagnosis not present

## 2019-11-11 DIAGNOSIS — M25611 Stiffness of right shoulder, not elsewhere classified: Secondary | ICD-10-CM | POA: Diagnosis not present

## 2019-11-14 DIAGNOSIS — Z7982 Long term (current) use of aspirin: Secondary | ICD-10-CM | POA: Diagnosis not present

## 2019-11-14 DIAGNOSIS — E785 Hyperlipidemia, unspecified: Secondary | ICD-10-CM | POA: Diagnosis not present

## 2019-11-14 DIAGNOSIS — Z7984 Long term (current) use of oral hypoglycemic drugs: Secondary | ICD-10-CM | POA: Diagnosis not present

## 2019-11-14 DIAGNOSIS — Z87730 Personal history of (corrected) cleft lip and palate: Secondary | ICD-10-CM | POA: Diagnosis not present

## 2019-11-14 DIAGNOSIS — I10 Essential (primary) hypertension: Secondary | ICD-10-CM | POA: Diagnosis not present

## 2019-11-14 DIAGNOSIS — Z880 Allergy status to penicillin: Secondary | ICD-10-CM | POA: Diagnosis not present

## 2019-11-14 DIAGNOSIS — Z87891 Personal history of nicotine dependence: Secondary | ICD-10-CM | POA: Diagnosis not present

## 2019-11-14 DIAGNOSIS — H409 Unspecified glaucoma: Secondary | ICD-10-CM | POA: Diagnosis not present

## 2019-11-14 DIAGNOSIS — N529 Male erectile dysfunction, unspecified: Secondary | ICD-10-CM | POA: Diagnosis not present

## 2019-11-14 DIAGNOSIS — E119 Type 2 diabetes mellitus without complications: Secondary | ICD-10-CM | POA: Diagnosis not present

## 2019-11-17 DIAGNOSIS — M25611 Stiffness of right shoulder, not elsewhere classified: Secondary | ICD-10-CM | POA: Diagnosis not present

## 2019-11-17 DIAGNOSIS — M25511 Pain in right shoulder: Secondary | ICD-10-CM | POA: Diagnosis not present

## 2019-11-20 DIAGNOSIS — M25511 Pain in right shoulder: Secondary | ICD-10-CM | POA: Diagnosis not present

## 2019-11-20 DIAGNOSIS — M25611 Stiffness of right shoulder, not elsewhere classified: Secondary | ICD-10-CM | POA: Diagnosis not present

## 2019-11-21 DIAGNOSIS — E119 Type 2 diabetes mellitus without complications: Secondary | ICD-10-CM | POA: Diagnosis not present

## 2019-11-21 DIAGNOSIS — H40052 Ocular hypertension, left eye: Secondary | ICD-10-CM | POA: Diagnosis not present

## 2019-11-21 DIAGNOSIS — H401132 Primary open-angle glaucoma, bilateral, moderate stage: Secondary | ICD-10-CM | POA: Diagnosis not present

## 2019-11-21 DIAGNOSIS — H35033 Hypertensive retinopathy, bilateral: Secondary | ICD-10-CM | POA: Diagnosis not present

## 2019-11-21 LAB — HM DIABETES EYE EXAM

## 2019-11-24 ENCOUNTER — Encounter: Payer: Self-pay | Admitting: *Deleted

## 2019-11-24 DIAGNOSIS — M25511 Pain in right shoulder: Secondary | ICD-10-CM | POA: Diagnosis not present

## 2019-11-24 DIAGNOSIS — M25611 Stiffness of right shoulder, not elsewhere classified: Secondary | ICD-10-CM | POA: Diagnosis not present

## 2019-11-27 DIAGNOSIS — M25511 Pain in right shoulder: Secondary | ICD-10-CM | POA: Diagnosis not present

## 2019-12-02 DIAGNOSIS — M25511 Pain in right shoulder: Secondary | ICD-10-CM | POA: Diagnosis not present

## 2019-12-02 DIAGNOSIS — M25611 Stiffness of right shoulder, not elsewhere classified: Secondary | ICD-10-CM | POA: Diagnosis not present

## 2019-12-05 DIAGNOSIS — M25511 Pain in right shoulder: Secondary | ICD-10-CM | POA: Diagnosis not present

## 2019-12-05 DIAGNOSIS — M25611 Stiffness of right shoulder, not elsewhere classified: Secondary | ICD-10-CM | POA: Diagnosis not present

## 2019-12-08 DIAGNOSIS — M25511 Pain in right shoulder: Secondary | ICD-10-CM | POA: Diagnosis not present

## 2019-12-08 DIAGNOSIS — M25611 Stiffness of right shoulder, not elsewhere classified: Secondary | ICD-10-CM | POA: Diagnosis not present

## 2019-12-10 DIAGNOSIS — M25611 Stiffness of right shoulder, not elsewhere classified: Secondary | ICD-10-CM | POA: Diagnosis not present

## 2019-12-15 DIAGNOSIS — M25511 Pain in right shoulder: Secondary | ICD-10-CM | POA: Diagnosis not present

## 2019-12-15 DIAGNOSIS — M25611 Stiffness of right shoulder, not elsewhere classified: Secondary | ICD-10-CM | POA: Diagnosis not present

## 2019-12-18 DIAGNOSIS — M25511 Pain in right shoulder: Secondary | ICD-10-CM | POA: Diagnosis not present

## 2019-12-22 DIAGNOSIS — M25511 Pain in right shoulder: Secondary | ICD-10-CM | POA: Diagnosis not present

## 2019-12-22 DIAGNOSIS — M25611 Stiffness of right shoulder, not elsewhere classified: Secondary | ICD-10-CM | POA: Diagnosis not present

## 2019-12-29 DIAGNOSIS — M25611 Stiffness of right shoulder, not elsewhere classified: Secondary | ICD-10-CM | POA: Diagnosis not present

## 2019-12-29 DIAGNOSIS — M25511 Pain in right shoulder: Secondary | ICD-10-CM | POA: Diagnosis not present

## 2019-12-29 DIAGNOSIS — M79641 Pain in right hand: Secondary | ICD-10-CM | POA: Diagnosis not present

## 2020-01-01 DIAGNOSIS — M79641 Pain in right hand: Secondary | ICD-10-CM | POA: Diagnosis not present

## 2020-01-05 DIAGNOSIS — M79641 Pain in right hand: Secondary | ICD-10-CM | POA: Diagnosis not present

## 2020-01-06 DIAGNOSIS — M89021 Algoneurodystrophy, right upper arm: Secondary | ICD-10-CM | POA: Diagnosis not present

## 2020-01-08 ENCOUNTER — Other Ambulatory Visit: Payer: Self-pay

## 2020-01-08 ENCOUNTER — Other Ambulatory Visit: Payer: Medicare HMO

## 2020-01-08 DIAGNOSIS — M25611 Stiffness of right shoulder, not elsewhere classified: Secondary | ICD-10-CM | POA: Diagnosis not present

## 2020-01-08 DIAGNOSIS — E785 Hyperlipidemia, unspecified: Secondary | ICD-10-CM

## 2020-01-08 DIAGNOSIS — E1142 Type 2 diabetes mellitus with diabetic polyneuropathy: Secondary | ICD-10-CM | POA: Diagnosis not present

## 2020-01-08 DIAGNOSIS — I10 Essential (primary) hypertension: Secondary | ICD-10-CM

## 2020-01-08 DIAGNOSIS — M25511 Pain in right shoulder: Secondary | ICD-10-CM | POA: Diagnosis not present

## 2020-01-08 DIAGNOSIS — E039 Hypothyroidism, unspecified: Secondary | ICD-10-CM

## 2020-01-08 DIAGNOSIS — E1169 Type 2 diabetes mellitus with other specified complication: Secondary | ICD-10-CM | POA: Diagnosis not present

## 2020-01-09 LAB — COMPLETE METABOLIC PANEL WITH GFR
AG Ratio: 2 (calc) (ref 1.0–2.5)
ALT: 24 U/L (ref 9–46)
AST: 21 U/L (ref 10–35)
Albumin: 4.5 g/dL (ref 3.6–5.1)
Alkaline phosphatase (APISO): 77 U/L (ref 35–144)
BUN: 9 mg/dL (ref 7–25)
CO2: 32 mmol/L (ref 20–32)
Calcium: 10 mg/dL (ref 8.6–10.3)
Chloride: 102 mmol/L (ref 98–110)
Creat: 0.98 mg/dL (ref 0.70–1.18)
GFR, Est African American: 86 mL/min/{1.73_m2} (ref >=60)
GFR, Est Non African American: 74 mL/min/{1.73_m2} (ref >=60)
Globulin: 2.3 g/dL (calc) (ref 1.9–3.7)
Glucose, Bld: 101 mg/dL — ABNORMAL HIGH (ref 65–99)
Potassium: 4.1 mmol/L (ref 3.5–5.3)
Sodium: 141 mmol/L (ref 135–146)
Total Bilirubin: 0.7 mg/dL (ref 0.2–1.2)
Total Protein: 6.8 g/dL (ref 6.1–8.1)

## 2020-01-09 LAB — CBC WITH DIFFERENTIAL/PLATELET
Absolute Monocytes: 720 cells/uL (ref 200–950)
Basophils Absolute: 48 cells/uL (ref 0–200)
Basophils Relative: 0.5 %
Eosinophils Absolute: 288 cells/uL (ref 15–500)
Eosinophils Relative: 3 %
HCT: 46.6 % (ref 38.5–50.0)
Hemoglobin: 15.8 g/dL (ref 13.2–17.1)
Lymphs Abs: 2784 cells/uL (ref 850–3900)
MCH: 29.9 pg (ref 27.0–33.0)
MCHC: 33.9 g/dL (ref 32.0–36.0)
MCV: 88.3 fL (ref 80.0–100.0)
MPV: 10.7 fL (ref 7.5–12.5)
Monocytes Relative: 7.5 %
Neutro Abs: 5760 cells/uL (ref 1500–7800)
Neutrophils Relative %: 60 %
Platelets: 292 10*3/uL (ref 140–400)
RBC: 5.28 10*6/uL (ref 4.20–5.80)
RDW: 11.6 % (ref 11.0–15.0)
Total Lymphocyte: 29 %
WBC: 9.6 10*3/uL (ref 3.8–10.8)

## 2020-01-09 LAB — LIPID PANEL
Cholesterol: 139 mg/dL (ref <200)
HDL: 52 mg/dL (ref >=40)
LDL Cholesterol (Calc): 69 mg/dL (calc)
Non-HDL Cholesterol (Calc): 87 mg/dL (calc) (ref <130)
Total CHOL/HDL Ratio: 2.7 (calc) (ref <5.0)
Triglycerides: 98 mg/dL (ref <150)

## 2020-01-09 LAB — HEMOGLOBIN A1C
Hgb A1c MFr Bld: 6 % of total Hgb — ABNORMAL HIGH (ref <5.7)
Mean Plasma Glucose: 126 (calc)
eAG (mmol/L): 7 (calc)

## 2020-01-09 LAB — TSH: TSH: 4.35 mIU/L (ref 0.40–4.50)

## 2020-01-09 NOTE — Progress Notes (Signed)
Labs are stable--almost identical to last check.  Not even anemic from surgery.  We'll review at his visit.

## 2020-01-12 ENCOUNTER — Encounter: Payer: Self-pay | Admitting: Internal Medicine

## 2020-01-12 ENCOUNTER — Other Ambulatory Visit: Payer: Self-pay

## 2020-01-12 ENCOUNTER — Ambulatory Visit (INDEPENDENT_AMBULATORY_CARE_PROVIDER_SITE_OTHER): Payer: Medicare HMO | Admitting: Internal Medicine

## 2020-01-12 VITALS — BP 124/78 | HR 83 | Temp 97.3°F | Ht 66.0 in | Wt 142.6 lb

## 2020-01-12 DIAGNOSIS — G90511 Complex regional pain syndrome I of right upper limb: Secondary | ICD-10-CM

## 2020-01-12 DIAGNOSIS — I10 Essential (primary) hypertension: Secondary | ICD-10-CM | POA: Diagnosis not present

## 2020-01-12 DIAGNOSIS — E785 Hyperlipidemia, unspecified: Secondary | ICD-10-CM | POA: Diagnosis not present

## 2020-01-12 DIAGNOSIS — E039 Hypothyroidism, unspecified: Secondary | ICD-10-CM | POA: Diagnosis not present

## 2020-01-12 DIAGNOSIS — E1169 Type 2 diabetes mellitus with other specified complication: Secondary | ICD-10-CM

## 2020-01-12 DIAGNOSIS — E1142 Type 2 diabetes mellitus with diabetic polyneuropathy: Secondary | ICD-10-CM | POA: Diagnosis not present

## 2020-01-12 NOTE — Progress Notes (Signed)
Location:  [facility] clinic  Provider: Dr. Bufford Spikes  Goals of Care:  Advanced Directives 01/12/2020  Does Patient Have a Medical Advance Directive? Yes  Type of Estate agent of [place];Living will  Does patient want to make changes to medical advance directive? -  Copy of Healthcare Power of Attorney in Chart? -  Would patient like information on creating a medical advance directive? -  Pre-existing out of facility DNR order (yellow form or pink MOST form) -     Chief Complaint  Patient presents with  . Medical Management of Chronic Issues    4 month follow up for medical management. Mrs. Burich is with him     HPI: Patient is a 79 y.o. male seen today for medical management of chronic diseases.    Labs reviewed with patient.   Wife present for visit.   Right shoulder sugery was October 06, 2019 by Dr. Ranell Patrick. Denies any pain, will sometimes experience pressure. Still doing PT exercises to strengthen arm and increase ROM. Taking Lyrica for pain.   His right hand has been painful since surgery. The pain is described as a burning, tingling and paresthesia. Right thumb is often numb. He is doing PT/OT for hand. He wears a special hand sleeve. Taking Lyrica daily to help with pain. Dr. Ranell Patrick also has him soak his hand in warm water for 20 minutes, then cold water for 20 minutes to help with nerve pain. In addition to PT/OT, he has also started getting cortisone injections to help with right hand pain.   Not doing any other exercising at this time.Tries to stay active when he can and do chores around house. Follows diet low in breads and sugars.   Next scheduled eye exam with Dr. Krista Blue in June 2021.   First covid vaccine was 01/08/2009. He is scheduled for second covid vaccine early February 2021.   Sleeping 8 hours a night. States besides his shoulder and hand he is doing well. Denies depression or anxiety.        Past Medical History:  Diagnosis  Date  . Abdominal pain, other specified site   . Backache, unspecified   . Carpal tunnel syndrome   . Diabetes mellitus without complication (HCC)   . Essential hypertension, benign   . Hyperlipidemia   . Impacted cerumen   . Impaired fasting glucose   . Other abnormal blood chemistry   . Pain in joint, lower leg   . Special screening for malignant neoplasm of prostate   . Unspecified essential hypertension   . Unspecified hypothyroidism     Past Surgical History:  Procedure Laterality Date  . (L) KNEE SURGERY"TORN MENISCUS"    . EYE SURGERY Left 11/24/2014   cataracts    Allergies  Allergen Reactions  . Penicillins Swelling  . Shellfish Allergy Swelling    Outpatient Encounter Medications as of 01/12/2020  Medication Sig  . Ascorbic Acid (VITAMIN C) 1000 MG tablet Take 1,000 mg by mouth 2 (two) times daily.  [name] aspirin 81 MG tablet Take 81 mg by mouth daily.   . Aspirin-Salicylamide-Caffeine (BC HEADACHE POWDER PO) Take by mouth as needed.  [name] atorvastatin (LIPITOR) 40 MG tablet TAKE 1 TABLET DAILY TO LOWER CHOLESTEROL  . hydrochlorothiazide (HYDRODIURIL) 25 MG tablet TAKE ONE AND ONE-HALF TABLETS DAILY  . latanoprost (XALATAN) 0.005 % ophthalmic solution Place 1 drop into both eyes at bedtime.  . metFORMIN (GLUCOPHAGE) 500 MG tablet TAKE 1 TABLET IN THE MORNING AND TAKE  ONE-HALF (1/2) TABLET IN THE EVENING  . Multiple Vitamins-Minerals (MULTIVITAMIN & MINERAL PO) Take 1 tablet by mouth daily.   . pregabalin (LYRICA) 75 MG capsule Take 75 mg by mouth 2 (two) times daily.  . sildenafil (VIAGRA) 50 MG tablet Take 1 tablet (50 mg total) by mouth as needed for erectile dysfunction.  . vitamin B-12 (CYANOCOBALAMIN) 1000 MCG tablet Take 500 mcg by mouth daily.    No facility-administered encounter medications on file as of 01/12/2020.    Review of Systems:  Review of Systems  Constitutional: Negative for activity change, appetite change and fatigue.  HENT: Negative for dental  problem, hearing loss and trouble swallowing.   Eyes: Negative for photophobia and visual disturbance.  Respiratory: Negative for cough and shortness of breath.   Cardiovascular: Negative for chest pain and palpitations.  Gastrointestinal: Negative for abdominal pain, constipation, diarrhea and nausea.  Endocrine: Negative for polydipsia, polyphagia and polyuria.  Genitourinary: Negative for dysuria, frequency and hematuria.  Musculoskeletal:       Right shoulder and hand pain.   Skin: Negative.   Neurological: Negative for dizziness, weakness and headaches.  Psychiatric/Behavioral: Negative for dysphoric mood and sleep disturbance. The patient is not nervous/anxious.     Health Maintenance  Topic Date Due  . FOOT EXAM  12/17/2018  . URINE MICROALBUMIN  09/10/2019  . HEMOGLOBIN A1C  07/07/2020  . OPHTHALMOLOGY EXAM  11/20/2020  . TETANUS/TDAP  12/27/2027  . INFLUENZA VACCINE  Completed  . PNA vac Low Risk Adult  Completed    Physical Exam: Vitals:   01/12/20 1039  BP: 124/78  Pulse: 83  Temp: (!) 97.3 F (36.3 C)  TempSrc: Temporal  SpO2: 97%  Weight: 142 lb 9.6 oz (64.7 kg)  Height: 5\' 6"  (1.676 m)   Body mass index is 23.02 kg/m. Physical Exam Vitals reviewed.  Constitutional:      Appearance: Normal appearance. He is normal weight.  Cardiovascular:     Rate and Rhythm: Normal rate and regular rhythm.     Pulses: Normal pulses.     Heart sounds: Normal heart sounds. No murmur.  Pulmonary:     Effort: Pulmonary effort is normal. No respiratory distress.     Breath sounds: Normal breath sounds. No wheezing.  Abdominal:     General: Abdomen is flat. Bowel sounds are normal.     Palpations: Abdomen is soft.  Musculoskeletal:     Right hand: No swelling or deformity. Normal range of motion. Normal strength. Decreased sensation of the radial distribution. Normal capillary refill.     Right lower leg: No edema.     Left lower leg: No edema.     Right foot: Normal  range of motion.     Left foot: Normal range of motion.  Feet:     Right foot:     Protective Sensation: 10 sites tested. 10 sites sensed.     Skin integrity: Skin integrity normal.     Toenail Condition: Right toenails are normal.     Left foot:     Protective Sensation: 10 sites tested. 10 sites sensed.     Skin integrity: Skin integrity normal.     Toenail Condition: Left toenails are normal.  Skin:    General: Skin is warm and dry.     Capillary Refill: Capillary refill takes less than 2 seconds.  Neurological:     General: No focal deficit present.     Mental Status: He is alert and oriented to person,  place, and time. Mental status is at baseline.  Psychiatric:        Mood and Affect: Mood normal.        Behavior: Behavior normal.        Thought Content: Thought content normal.        Judgment: Judgment normal.     Labs reviewed: Basic Metabolic Panel: Recent Labs    05/13/19 0819 09/01/19 0839 01/08/20 0832  NA 140 141 141  K 3.8 3.9 4.1  CL 101 102 102  CO2 32 32 32  GLUCOSE 101* 102* 101*  BUN 13 10 9   CREATININE 0.95 0.91 0.98  CALCIUM 10.0 9.9 10.0  TSH  --  4.01 4.35   Liver Function Tests: Recent Labs    09/01/19 0839 01/08/20 0832  AST 21 21  ALT 23 24  BILITOT 0.7 0.7  PROT 6.6 6.8   No results for input(s): LIPASE, AMYLASE in the last 8760 hours. No results for input(s): AMMONIA in the last 8760 hours. CBC: Recent Labs    05/13/19 0819 09/01/19 0839 01/08/20 0832  WBC 10.9* 9.5 9.6  NEUTROABS 6,769 5,529 5,760  HGB 15.3 15.5 15.8  HCT 45.5 45.0 46.6  MCV 90.1 88.9 88.3  PLT 295 296 292   Lipid Panel: Recent Labs    09/01/19 0839 01/08/20 0832  CHOL 134 139  HDL 50 52  LDLCALC 66 69  TRIG 98 98  CHOLHDL 2.7 2.7   Lab Results  Component Value Date   HGBA1C 6.0 (H) 01/08/2020    Procedures since last visit: No results found.  Assessment/Plan 1. 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 Veverly Fells - continue PT/OT as directed - continue lyrica as prescribed  2. Controlled type 2 diabetes mellitus with diabetic polyneuropathy, without long-term current use of insulin (HCC) - hemoglobin A1C 6.0, unchanged - continue current metformin regimen  - hemoglobin A1C- future - urine microalbumin- today - complete metabolic panel- future - diabetid foot exam- today - continue follow up with Dr. Parke Simmers for yearly diabetic eye examn in June 2021  3. Hyperlipidemia associated with type 2 diabetes mellitus (HCC) - LDL <100, at goal - continue current statin regimen - lipid panel-future - continue to follow a diet that limits high fat foods and fried food  4. Essential hypertension, benign - bp at goal <150/90 - continue HCTZ regimen - complete metabolic panel with GFR- future - cbc with differential/platelets- future  5. Hypothyroidism, unspecific type - TSH stable at 4.35 - TSH- future    Labs/tests ordered:   Cbc with differential/platelets, complete metabolic panel with GFR, lipid panel, , hemoglobin A1C, TSH- future Next appt:  02/19/2020

## 2020-01-13 LAB — MICROALBUMIN / CREATININE URINE RATIO
Creatinine, Urine: 99 mg/dL (ref 20–320)
Microalb Creat Ratio: 32 mcg/mg creat — ABNORMAL HIGH (ref <30)
Microalb, Ur: 3.2 mg/dL

## 2020-01-13 NOTE — Progress Notes (Signed)
Microalbumin ratio is trending up which mean his diabetes is causing protein to spill into his urine.  It is mild.  It means he should be taking an ace or arb blood pressure med to help protect his kidneys.  I'd like to decrease his diuretic (hctz) and add the other medication.  There are some combination tablets of diuretic and ARB that can be tried.  I note that he's had swelling of his lips with shellfish and penicillins so I'm thinking we should make sure he has an epi pen in case he has any reaction to the new drug which does have that as a potential side effect.  Please call him and make sure he gets Nita on the phone with him as you read this to them.

## 2020-01-13 NOTE — Progress Notes (Signed)
.   A copy of results was also mailed out to patient

## 2020-01-16 ENCOUNTER — Encounter: Payer: Self-pay | Admitting: Family

## 2020-01-16 ENCOUNTER — Other Ambulatory Visit: Payer: Self-pay

## 2020-01-16 ENCOUNTER — Ambulatory Visit: Payer: Medicare HMO | Admitting: Family

## 2020-01-16 VITALS — BP 120/62 | HR 77 | Temp 98.5°F | Ht 66.0 in | Wt 144.0 lb

## 2020-01-16 DIAGNOSIS — H6123 Impacted cerumen, bilateral: Secondary | ICD-10-CM

## 2020-01-16 DIAGNOSIS — R040 Epistaxis: Secondary | ICD-10-CM

## 2020-01-16 NOTE — Patient Instructions (Signed)
- Hold your asprin and BC if nose bleed recurs  - ENT referral placed today specialist office will call you with appointment.   Nosebleed, Adult A nosebleed is when blood comes out of the nose. Nosebleeds are common. Usually, they are not a sign of a serious condition. Nosebleeds can happen if a small blood vessel in your nose starts to bleed or if the lining of your nose (mucous membrane) cracks. They are commonly caused by:  Allergies.  Colds.  Picking your nose.  Blowing your nose too hard.  An injury from sticking an object into your nose or getting hit in the nose.  Dry or cold air. Less common causes of nosebleeds include:  Toxic fumes.  Something abnormal in the nose or in the air-filled spaces in the bones of the face (sinuses).  Growths in the nose, such as polyps.  Medicines or conditions that cause blood to clot slowly.  Certain illnesses or procedures that irritate or dry out the nasal passages. Follow these instructions at home: When you have a nosebleed:   Sit down and tilt your head slightly forward.  Use a clean towel or tissue to pinch your nostrils under the bony part of your nose. After 10 minutes, let go of your nose and see if bleeding starts again. Do not release pressure before that time. If there is still bleeding, repeat the pinching and holding for 10 minutes until the bleeding stops.  Do not place tissues or gauze in the nose to stop bleeding.  Avoid lying down and avoid tilting your head backward. That may make blood collect in the throat and cause gagging or coughing.  Use a nasal spray decongestant to help with a nosebleed as told by your health care provider.  Do not use petroleum jelly or mineral oil in your nose. It can drip into your lungs. After a nosebleed:  Avoid blowing your nose or sniffing for a number of hours.  Avoid straining, lifting, or bending at the waist for several days. You may resume other normal activities as you are  able.  Use saline spray or a humidifier as told by your health care provider.  Aspirinand blood thinners make bleeding more likely. If you are prescribed these medicines and you suffer from nosebleeds: ? Ask your health care provider if you should stop taking the medicines or if you should adjust the dose. ? Do not stop taking medicines that your health care provider has recommended unless told by your health care provider.  If your nosebleed was caused by dry mucous membranes, use over-the-counter saline nasal spray or gel. This will keep the mucous membranes moist and allow them to heal. If you must use a lubricant: ? Choose one that is water-soluble. ? Use only as much as you need and use it only as often as needed. ? Do not lie down until several hours after you use it. Contact a health care provider if:  You have a fever.  You get nosebleeds often or more often than usual.  You bruise very easily.  You have a nosebleed from having something stuck in your nose.  You have bleeding in your mouth.  You vomit or cough up brown material.  You have a nosebleed after you start a new medicine. Get help right away if:  You have a nosebleed after a fall or a head injury.  Your nosebleed does not go away after 20 minutes.  You feel dizzy or weak.  You have  unusual bleeding from other parts of your body.  You have unusual bruising on other parts of your body.  You become sweaty.  You vomit blood. This information is not intended to replace advice given to you by your health care provider. Make sure you discuss any questions you have with your health care provider. Document Revised: 03/05/2018 Document Reviewed: 06/20/2016 Elsevier Patient Education  2020 [employer].

## 2020-01-16 NOTE — Progress Notes (Signed)
Provider: Javonna Balli FNP-C  Kermit Balo, DO  Patient Care Team: Kermit Balo, DO as PCP - General (Geriatric Medicine) [name], MD as Consulting Physician (Ophthalmology) [name], MD as Consulting Physician (Gastroenterology)  Extended Emergency Contact Information Primary Emergency Contact: Lukens,Juanita Address: [address]          [place], [place] [address] [place] Home Phone: [phone] Mobile Phone: [phone] Relation: Spouse  Code Status:  Full Code  Goals of care: Advanced Directive information Advanced Directives 01/12/2020  Does Patient Have a Medical Advance Directive? Yes  Type of Estate agent of [place];Living will  Does patient want to make changes to medical advance directive? -  Copy of Healthcare Power of Attorney in Chart? -  Would patient like information on creating a medical advance directive? -  Pre-existing out of facility DNR order (yellow form or pink MOST form) -     Chief Complaint  Patient presents with  . Acute Visit    nosebleeds x3 days    HPI:  Pt is a 79 y.o. male seen today for an acute visit for evaluation of Nose bleed x 3 days. He is here with the wife.He picked on his left nares which might have caused the bleeding.States does not blow his nose hard.He has had a history of nose bleed several years ago while he was living in [place].Nose was cauterized and had no more bleeding until recently.He states used Afrin nasal spray.Also has saline nasal spray.He denies any headache or dizziness but states felt better after the nose bleed.No history of trauma/injury to nose.He had blood work recently 01/08/2020 Hgb 15.8 and Plts 292 stable compared to previous labs.He denies any fever,chills or nasal congestion.Medication reviewed currently on ASP 81 mg tablet and Takes BC occasionally for headaches.Does not think ASA could be contributing to his nose bleed since he has been on it for  several years.     Past Medical History:  Diagnosis Date  . Abdominal pain, other specified site   . Backache, unspecified   . Carpal tunnel syndrome   . Diabetes mellitus without complication (HCC)   . Essential hypertension, benign   . Hyperlipidemia   . Impacted cerumen   . Impaired fasting glucose   . Other abnormal blood chemistry   . Pain in joint, lower leg   . Special screening for malignant neoplasm of prostate   . Unspecified essential hypertension   . Unspecified hypothyroidism    Past Surgical History:  Procedure Laterality Date  . (L) KNEE SURGERY"TORN MENISCUS"    . EYE SURGERY Left 11/24/2014   cataracts    Allergies  Allergen Reactions  . Penicillins Swelling  . Shellfish Allergy Swelling    Outpatient Encounter Medications as of 01/16/2020  Medication Sig  . Ascorbic Acid (VITAMIN C) 1000 MG tablet Take 1,000 mg by mouth 2 (two) times daily.  [name] aspirin 81 MG tablet Take 81 mg by mouth daily.   . Aspirin-Salicylamide-Caffeine (BC HEADACHE POWDER PO) Take by mouth as needed.  [name] atorvastatin (LIPITOR) 40 MG tablet TAKE 1 TABLET DAILY TO LOWER CHOLESTEROL  . hydrochlorothiazide (HYDRODIURIL) 25 MG tablet TAKE ONE AND ONE-HALF TABLETS DAILY  . latanoprost (XALATAN) 0.005 % ophthalmic solution Place 1 drop into both eyes at bedtime.  . metFORMIN (GLUCOPHAGE) 500 MG tablet TAKE 1 TABLET IN THE MORNING AND TAKE ONE-HALF (1/2) TABLET IN THE EVENING  . Multiple Vitamins-Minerals (MULTIVITAMIN & MINERAL PO) Take 1 tablet by mouth  daily.   . pregabalin (LYRICA) 75 MG capsule Take 75 mg by mouth 2 (two) times daily.  . Pyridoxine HCl (VITAMIN B-6) 500 MG tablet Take 500 mg by mouth 2 (two) times daily.  . sildenafil (VIAGRA) 50 MG tablet Take 1 tablet (50 mg total) by mouth as needed for erectile dysfunction.  . vitamin B-12 (CYANOCOBALAMIN) 1000 MCG tablet Take 500 mcg by mouth daily.    No facility-administered encounter medications on file as of 01/16/2020.     Review of Systems  Constitutional: Negative for appetite change, chills, fatigue and fever.  HENT: Negative for congestion, hearing loss, rhinorrhea, sinus pressure, sinus pain, sneezing and sore throat.        Nose bleed x 3 days   Eyes: Negative for pain, discharge, redness and itching.  Respiratory: Negative for cough, chest tightness, shortness of breath and wheezing.   Cardiovascular: Negative for chest pain, palpitations and leg swelling.  Gastrointestinal: Negative for abdominal distention, abdominal pain, blood in stool, constipation, diarrhea, nausea, rectal pain and vomiting.  Genitourinary: Negative for flank pain, frequency and hematuria.  Musculoskeletal: Negative for arthralgias, gait problem and myalgias.  Skin: Negative for color change, pallor and rash.  Neurological: Negative for dizziness, seizures, speech difficulty, weakness and light-headedness.       Occasional headaches   Hematological: Does not bruise/bleed easily.  Psychiatric/Behavioral: Negative for agitation, confusion and sleep disturbance. The patient is not nervous/anxious.     Immunization History  Administered Date(s) Administered  . Fluad Quad(high Dose 65+) 09/08/2019  . Influenza, High Dose Seasonal PF 08/30/2017, 09/09/2018  . Influenza,inj,Quad PF,6+ Mos 08/31/2014, 09/30/2015, 08/03/2016  . Influenza,inj,quad, With Preservative 09/17/2018  . Influenza-Unspecified 09/17/2012, 09/17/2013  . Pneumococcal Conjugate-13 11/26/2014, 11/27/2014  . Pneumococcal Polysaccharide-23 12/03/2007  . Td 12/03/2007  . Tdap 12/26/2017  . Zoster 03/15/2013  . Zoster Recombinat (Shingrix) 06/01/2017, 11/14/2017   Pertinent  Health Maintenance Due  Topic Date Due  . HEMOGLOBIN A1C  07/07/2020  . OPHTHALMOLOGY EXAM  11/20/2020  . FOOT EXAM  01/11/2021  . URINE MICROALBUMIN  01/11/2021  . INFLUENZA VACCINE  Completed  . PNA vac Low Risk Adult  Completed   Fall Risk  01/16/2020 01/12/2020 05/15/2019 02/18/2019  01/13/2019  Falls in the past year? 0 0 0 0 0  Number falls in past yr: 0 0 0 0 0  Injury with Fall? 0 0 0 0 0    Vitals:   01/16/20 1330  BP: 120/62  Pulse: 77  Temp: 98.5 F (36.9 C)  TempSrc: Oral  SpO2: 96%  Weight: 144 lb (65.3 kg)  Height: 5\' 6"  (1.676 m)   Body mass index is 23.24 kg/m. Physical Exam Vitals reviewed.  Constitutional:      General: He is not in acute distress.    Appearance: He is not ill-appearing.  HENT:     Head: Normocephalic.     Right Ear: There is impacted cerumen.     Left Ear: There is impacted cerumen.     Nose: Nose normal. No congestion or rhinorrhea.     Mouth/Throat:     Mouth: Mucous membranes are moist.     Pharynx: Oropharynx is clear. No oropharyngeal exudate or posterior oropharyngeal erythema.  Eyes:     General: No scleral icterus.       Right eye: No discharge.        Left eye: No discharge.     Conjunctiva/sclera: Conjunctivae normal.     Pupils: Pupils are equal, round, and  reactive to light.  Neck:     Vascular: No carotid bruit.  Cardiovascular:     Rate and Rhythm: Normal rate and regular rhythm.     Pulses: Normal pulses.     Heart sounds: Normal heart sounds. No murmur. No friction rub. No gallop.   Pulmonary:     Effort: Pulmonary effort is normal. No respiratory distress.     Breath sounds: Normal breath sounds. No wheezing, rhonchi or rales.  Chest:     Chest wall: No tenderness.  Abdominal:     General: Bowel sounds are normal. There is no distension.     Palpations: Abdomen is soft. There is no mass.     Tenderness: There is no abdominal tenderness. There is no right CVA tenderness, left CVA tenderness, guarding or rebound.  Musculoskeletal:        General: No swelling or tenderness. Normal range of motion.     Cervical back: Normal range of motion. No rigidity or tenderness.     Right lower leg: No edema.     Left lower leg: No edema.  Lymphadenopathy:     Cervical: No cervical adenopathy.  Skin:     General: Skin is warm.     Coloration: Skin is not pale.     Findings: No bruising, erythema or rash.  Neurological:     Mental Status: He is alert and oriented to person, place, and time.     Cranial Nerves: No cranial nerve deficit.     Motor: No weakness.     Gait: Gait normal.  Psychiatric:        Mood and Affect: Mood normal.        Behavior: Behavior normal.        Thought Content: Thought content normal.        Judgment: Judgment normal.    Labs reviewed: Recent Labs    05/13/19 0819 09/01/19 0839 01/08/20 0832  NA 140 141 141  K 3.8 3.9 4.1  CL 101 102 102  CO2 32 32 32  GLUCOSE 101* 102* 101*  BUN 13 10 9   CREATININE 0.95 0.91 0.98  CALCIUM 10.0 9.9 10.0   Recent Labs    09/01/19 0839 01/08/20 0832  AST 21 21  ALT 23 24  BILITOT 0.7 0.7  PROT 6.6 6.8   Recent Labs    05/13/19 0819 09/01/19 0839 01/08/20 0832  WBC 10.9* 9.5 9.6  NEUTROABS 6,769 5,529 5,760  HGB 15.3 15.5 15.8  HCT 45.5 45.0 46.6  MCV 90.1 88.9 88.3  PLT 295 296 292   Lab Results  Component Value Date   TSH 4.35 01/08/2020   Lab Results  Component Value Date   HGBA1C 6.0 (H) 01/08/2020   Lab Results  Component Value Date   CHOL 139 01/08/2020   HDL 52 01/08/2020   LDLCALC 69 01/08/2020   TRIG 98 01/08/2020   CHOLHDL 2.7 01/08/2020    Significant Diagnostic Results in last 30 days:  No results found.  Assessment/Plan  1. Epistaxis Afebrile.No current bleeding noted.Recent labs reviewed stable.No petechiae noted.  - Avoid picking or blowing nose to hard. - continue on Afrin and saline nasal spray as needed. - Hold ASA and BC if nose bleed recurs.  - Additional nose bleed education information provided on AVS today.  - Ambulatory referral to ENT specialist office will call you for  appointment.   2. Bilateral impacted cerumen TM not visualized. - Ambulatory referral to ENT  Family/ staff Communication:  Reviewed plan of care with patient  Labs/tests ordered:  None   Next Appointment: as needed if symptoms worsen  Caesar Bookman, NP

## 2020-01-20 ENCOUNTER — Telehealth: Payer: Self-pay

## 2020-01-20 ENCOUNTER — Other Ambulatory Visit: Payer: Self-pay | Admitting: Internal Medicine

## 2020-01-20 DIAGNOSIS — E1129 Type 2 diabetes mellitus with other diabetic kidney complication: Secondary | ICD-10-CM

## 2020-01-20 DIAGNOSIS — T7840XA Allergy, unspecified, initial encounter: Secondary | ICD-10-CM

## 2020-01-20 DIAGNOSIS — R809 Proteinuria, unspecified: Secondary | ICD-10-CM

## 2020-01-20 MED ORDER — EPINEPHRINE 0.3 MG/0.3ML IJ SOAJ: 0.3000 mg | INTRAMUSCULAR | 1 refills | Status: DC | PRN

## 2020-01-20 MED ORDER — LISINOPRIL-HYDROCHLOROTHIAZIDE 20-12.5 MG PO TABS: 1.0000 | ORAL_TABLET | Freq: Every day | ORAL | 0 refills | Status: DC

## 2020-01-20 NOTE — Telephone Encounter (Signed)
I had never gotten a message back to indicate that they were ok with the change and the epipen so I didn't order anything. He should stop hydrochlorothiazide 25mg  1.5 tabs daily. 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  I have now sent the lisinopril/hctz just 30 day supply to make sure he tolerates it and an epipen to [facility].  If things are going well with the new med after two weeks, they can let know and we'll send the Rx to his mail order pharmacy.

## 2020-01-20 NOTE — Telephone Encounter (Signed)
Patient/wife called and stated he was supposed to get a new medication that he would potentially have a reaction to and he would need an EpiPen just in case.  The below note was copied from your 01/12/20 visit where I see the EpiPen mentioned, but other than an ACE or ARB, I did not see reference to any specific medication. The plan was also to continue the HCTZ regimen. I don't see any new meds on his med list.  Please advise.    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  It is mild.  It means he should be taking an ace or arb blood pressure med to help protect his kidneys.  I'd like to decrease his diuretic (hctz) and add the other medication.  There are some combination tablets of diuretic and ARB that can be tried.  I note that he's had swelling of his lips with shellfish and penicillins so I'm thinking we should make sure he has an epi pen in case he has any reaction to the new drug which does have that as a potential side effect.  Please call him and make sure he gets Nita on the phone with him as you read this to them."     4. Essential hypertension, benign - bp at goal <150/90 - continue HCTZ regimen - complete metabolic panel with GFR- future - cbc with differential/platelets- future

## 2020-01-20 NOTE — Telephone Encounter (Signed)
Patient/wife called and the instructions were provided.  Patient said Rx should have been sent to the mail order pharmacy.  I explained to him why it was not, and that if he did well on the new medication then further orders would be sent to the mail order pharmacy.  Patient/wife were in the car at the time of my call and said they were on the way to [facility] to pick up the medication.

## 2020-01-26 ENCOUNTER — Ambulatory Visit: Payer: Medicare Other

## 2020-01-27 ENCOUNTER — Other Ambulatory Visit: Payer: Self-pay | Admitting: Internal Medicine

## 2020-01-29 ENCOUNTER — Ambulatory Visit (INDEPENDENT_AMBULATORY_CARE_PROVIDER_SITE_OTHER): Payer: Medicare HMO | Admitting: Otolaryngology

## 2020-01-29 ENCOUNTER — Encounter (INDEPENDENT_AMBULATORY_CARE_PROVIDER_SITE_OTHER): Payer: Self-pay | Admitting: Otolaryngology

## 2020-01-29 ENCOUNTER — Other Ambulatory Visit: Payer: Self-pay

## 2020-01-29 VITALS — Temp 97.5°F

## 2020-01-29 DIAGNOSIS — R040 Epistaxis: Secondary | ICD-10-CM

## 2020-01-29 NOTE — Progress Notes (Signed)
HPI: Tyler Hull is a 79 y.o. male who presents is referred by [facility] care for evaluation of recurrent left-sided epistaxis.  This had been previously cauterized on the left side by myself approximately 7 years ago.  He did well for a number of years but over the past several weeks he has had recurrent left-sided epistaxis again.[name] He takes a baby aspirin but no other blood thinners. He is able to stop the bleeding with Afrin in either tissue or a cottonball.  Past Medical History:  Diagnosis Date  . Abdominal pain, other specified site   . Backache, unspecified   . Carpal tunnel syndrome   . Diabetes mellitus without complication (HCC)   . Essential hypertension, benign   . Hyperlipidemia   . Impacted cerumen   . Impaired fasting glucose   . Other abnormal blood chemistry   . Pain in joint, lower leg   . Special screening for malignant neoplasm of prostate   . Unspecified essential hypertension   . Unspecified hypothyroidism    Past Surgical History:  Procedure Laterality Date  . (L) KNEE SURGERY"TORN MENISCUS"    . EYE SURGERY Left 11/24/2014   cataracts   Social History   Socioeconomic History  . Marital status: Married    Spouse name: Not on file  . Number of children: Not on file  . Years of education: Not on file  . Highest education level: Not on file  Occupational History  . Not on file  Tobacco Use  . Smoking status: Former Smoker    Years: 13.00    Types: Cigarettes    Quit date: 01/02/1966    Years since quitting: 54.1  . Smokeless tobacco: Never Used  . Tobacco comment: Quit at age 34   Substance and Sexual Activity  . Alcohol use: No  . Drug use: No  . Sexual activity: Yes  Other Topics Concern  . Not on file  Social History Narrative  . Not on file   Social Determinants of Health   Financial Resource Strain:   . Difficulty of Paying Living Expenses: Not on file  Food Insecurity:   . Worried About Programme researcher, broadcasting/film/video in the Last Year:  Not on file  . Ran Out of Food in the Last Year: Not on file  Transportation Needs:   . Lack of Transportation (Medical): Not on file  . Lack of Transportation (Non-Medical): Not on file  Physical Activity:   . Days of Exercise per Week: Not on file  . Minutes of Exercise per Session: Not on file  Stress:   . Feeling of Stress : Not on file  Social Connections:   . Frequency of Communication with Friends and Family: Not on file  . Frequency of Social Gatherings with Friends and Family: Not on file  . Attends Religious Services: Not on file  . Active Member of Clubs or Organizations: Not on file  . Attends Banker Meetings: Not on file  . Marital Status: Not on file   Family History  Problem Relation Age of Onset  . Alzheimer's disease Sister   . Cancer Brother 60       lung  . Diabetes Sister   . Diabetes Sister   . Lung cancer Sister 77       mets to back  . Kidney disease Sister   . Allergies Sister   . Obesity Sister   . Cancer Mother    Allergies  Allergen Reactions  .  Penicillins Swelling  . Shellfish Allergy Swelling   Prior to Admission medications   Medication Sig Start Date End Date Taking? Authorizing Provider  Ascorbic Acid (VITAMIN C) 1000 MG tablet Take 1,000 mg by mouth 2 (two) times daily.   Yes [provider]  aspirin 81 MG tablet Take 81 mg by mouth daily.    Yes [provider]  Aspirin-Salicylamide-Caffeine (BC HEADACHE POWDER PO) Take by mouth as needed.   Yes [provider]  atorvastatin (LIPITOR) 40 MG tablet TAKE 1 TABLET DAILY TO LOWER CHOLESTEROL 05/22/19  Yes Reed, Tiffany L, DO  EPINEPHrine 0.3 mg/0.3 mL IJ SOAJ injection Inject 0.3 mLs (0.3 mg total) into the muscle as needed for anaphylaxis. 01/20/20  Yes Reed, Tiffany L, DO  latanoprost (XALATAN) 0.005 % ophthalmic solution Place 1 drop into both eyes at bedtime. 11/01/14  Yes [provider]  lisinopril-hydrochlorothiazide (ZESTORETIC) 20-12.5  MG tablet TAKE 1 TABLET BY MOUTH DAILY 01/20/20  Yes Reed, Tiffany L, DO  metFORMIN (GLUCOPHAGE) 500 MG tablet TAKE 1 TABLET IN THE MORNING AND TAKE ONE-HALF (1/2) TABLET IN THE EVENING 01/27/20  Yes Reed, Tiffany L, DO  Multiple Vitamins-Minerals (MULTIVITAMIN & MINERAL PO) Take 1 tablet by mouth daily.    Yes [provider]  pregabalin (LYRICA) 75 MG capsule Take 75 mg by mouth 2 (two) times daily. 12/23/19  Yes [provider]  Pyridoxine HCl (VITAMIN B-6) 500 MG tablet Take 500 mg by mouth 2 (two) times daily.   Yes [provider]  sildenafil (VIAGRA) 50 MG tablet Take 1 tablet (50 mg total) by mouth as needed for erectile dysfunction. 09/08/19  Yes Reed, Tiffany L, DO  vitamin B-12 (CYANOCOBALAMIN) 1000 MCG tablet Take 500 mcg by mouth daily.    Yes [provider]     Positive ROS: Otherwise negative  All other systems have been reviewed and were otherwise negative with the exception of those mentioned in the HPI and as above.  Physical Exam: Constitutional: Alert, well-appearing, no acute distress Ears: External ears without lesions or tenderness. Ear canals are clear bilaterally with intact, clear TMs.  Nasal: External nose without lesions. Septum relatively midline.  He has a prominent vessel along the left anterior septum.  When I removed the crusting he had fairly profuse bleeding that was controlled with silver nitrate cauterization.  Remaining nasal cavity was clear..  Oral: Lips and gums without lesions. Tongue and palate mucosa without lesions. Posterior oropharynx clear. Neck: No palpable adenopathy or masses Respiratory: Breathing comfortably  Skin: No facial/neck lesions or rash noted.  Control of epistaxis  Date/Time: 01/29/2020 9:50 PM Performed by: Drema Halon, MD Authorized by: Drema Halon, MD   Consent:    Consent obtained:  Verbal   Consent given by:  Patient   Risks discussed:  Bleeding and pain   Alternatives  discussed:  No treatment and observation Anesthesia:    Anesthesia method:  None Procedure details:    Treatment site:  L anterior and L septum   Treatment method:  Silver nitrate   Treatment complexity:  Limited   Treatment episode: initial   Post-procedure details:    Assessment:  Bleeding stopped   Patient tolerance of procedure:  Tolerated well, no immediate complications Comments:     He had a prominent vessel along the left anterior septum that was cauterized using silver nitrate.    Assessment: Left sided epistaxis from anterior left septal vessel.  Plan: This was cauterized in the office.  He will follow-up as needed any further bleeding.   Radene Journey, MD   CC:

## 2020-02-17 LAB — HM COLONOSCOPY

## 2020-02-19 ENCOUNTER — Ambulatory Visit (INDEPENDENT_AMBULATORY_CARE_PROVIDER_SITE_OTHER): Payer: Medicare HMO | Admitting: Family

## 2020-02-19 ENCOUNTER — Encounter: Payer: Self-pay | Admitting: Family

## 2020-02-19 ENCOUNTER — Other Ambulatory Visit: Payer: Self-pay

## 2020-02-19 ENCOUNTER — Encounter: Payer: Self-pay | Admitting: Internal Medicine

## 2020-02-19 ENCOUNTER — Encounter: Payer: Medicare HMO | Admitting: Family

## 2020-02-19 DIAGNOSIS — Z0000 Encounter for general adult medical examination without abnormal findings: Secondary | ICD-10-CM

## 2020-02-19 NOTE — Progress Notes (Signed)
Patient ID: Tyler Hull, male   DOB: [birth year], 79 y.o.   MRN: [id number] This service is provided via telemedicine  No vital signs collected/recorded due to the encounter was a telemedicine visit.   Location of patient (ex: home, work):  HOME  Patient consents to a telephone visit:  YES  Location of the provider (ex: office, home):  OFFICE  Name of any referring provider:  TIFFANY REED, DO  Names of all persons participating in the telemedicine service and their role in the encounter:  PATIENT, [name], CMA,, [facility] NGETICH, NP  Time spent on call:  11:18

## 2020-02-19 NOTE — Progress Notes (Signed)
Subjective:   Tyler Hull is a 79 y.o. male who presents for Medicare Annual/Subsequent preventive examination.  Review of Systems:  Cardiac Risk Factors include: advanced age (>37men, >62 women);diabetes mellitus;dyslipidemia;hypertension;male gender;smoking/ tobacco exposure     Objective:    Vitals: There were no vitals taken for this visit.  There is no height or weight on file to calculate BMI.  Advanced Directives 02/19/2020 01/12/2020 05/15/2019 02/18/2019 09/09/2018 07/10/2018 04/18/2018  Does Patient Have a Medical Advance Directive? No Yes Yes Yes Yes Yes Yes  Type of Advance Directive - Healthcare Power of [place];Living will Living will Living will Healthcare Power of [place];Living will Healthcare Power of [place];Living will Living will  Does patient want to make changes to medical advance directive? - - No - Patient declined No - Patient declined No - Patient declined - No - Patient declined  Copy of Healthcare Power of Attorney in Chart? - - - - Yes Yes -  Would patient like information on creating a medical advance directive? No - Patient declined - - - - - No - Patient declined  Pre-existing out of facility DNR order (yellow form or pink MOST form) - - - - - - -    Tobacco Social History   Tobacco Use  Smoking Status Former Smoker  . Years: 13.00  . Types: Cigarettes  . Quit date: 01/02/1966  . Years since quitting: 54.1  Smokeless Tobacco Never Used  Tobacco Comment   Quit at age 24      Counseling given: Not Answered Comment: Quit at age 22    Clinical Intake:  Pre-visit preparation completed: No  Pain : No/denies pain  BMI - recorded: 23.25(previous BMI) Nutritional Status: BMI of 19-24  Normal Nutritional Risks: None Diabetes: Yes CBG done?: No Did pt. bring in CBG monitor from home?: No  How often do you need to have someone help you when you read instructions, pamphlets, or other written materials from your doctor or pharmacy?: 1 - Never What  is the last grade level you completed in school?: 12 Grade  Interpreter Needed?: No  Information entered by :: Merlyn Conley FNP-C  Past Medical History:  Diagnosis Date  . Abdominal pain, other specified site   . Backache, unspecified   . Carpal tunnel syndrome   . Diabetes mellitus without complication (HCC)   . Essential hypertension, benign   . Hyperlipidemia   . Impacted cerumen   . Impaired fasting glucose   . Other abnormal blood chemistry   . Pain in joint, lower leg   . Special screening for malignant neoplasm of prostate   . Unspecified essential hypertension   . Unspecified hypothyroidism    Past Surgical History:  Procedure Laterality Date  . (L) KNEE SURGERY"TORN MENISCUS"    . EYE SURGERY Left 11/24/2014   cataracts   Family History  Problem Relation Age of Onset  . Alzheimer's disease Sister   . Cancer Brother 60       lung  . Diabetes Sister   . Diabetes Sister   . Lung cancer Sister 77       mets to back  . Kidney disease Sister   . Allergies Sister   . Obesity Sister   . Cancer Mother    Social History   Socioeconomic History  . Marital status: Married    Spouse name: Not on file  . Number of children: Not on file  . Years of education: Not on file  . Highest education  level: Not on file  Occupational History  . Not on file  Tobacco Use  . Smoking status: Former Smoker    Years: 13.00    Types: Cigarettes    Quit date: 01/02/1966    Years since quitting: 54.1  . Smokeless tobacco: Never Used  . Tobacco comment: Quit at age 61   Substance and Sexual Activity  . Alcohol use: No  . Drug use: No  . Sexual activity: Yes  Other Topics Concern  . Not on file  Social History Narrative  . Not on file   Social Determinants of Health   Financial Resource Strain:   . Difficulty of Paying Living Expenses: Not on file  Food Insecurity:   . Worried About Charity fundraiser in the Last Year: Not on file  . Ran Out of Food in the Last Year:  Not on file  Transportation Needs:   . Lack of Transportation (Medical): Not on file  . Lack of Transportation (Non-Medical): Not on file  Physical Activity:   . Days of Exercise per Week: Not on file  . Minutes of Exercise per Session: Not on file  Stress:   . Feeling of Stress : Not on file  Social Connections:   . Frequency of Communication with Friends and Family: Not on file  . Frequency of Social Gatherings with Friends and Family: Not on file  . Attends Religious Services: Not on file  . Active Member of Clubs or Organizations: Not on file  . Attends Archivist Meetings: Not on file  . Marital Status: Not on file    Outpatient Encounter Medications as of 02/19/2020  Medication Sig  . Ascorbic Acid (VITAMIN C) 1000 MG tablet Take 1,000 mg by mouth 2 (two) times daily.  [name] aspirin 81 MG tablet Take 81 mg by mouth daily.   . Aspirin-Salicylamide-Caffeine (BC HEADACHE POWDER PO) Take by mouth as needed.  [name] atorvastatin (LIPITOR) 40 MG tablet TAKE 1 TABLET DAILY TO LOWER CHOLESTEROL  . latanoprost (XALATAN) 0.005 % ophthalmic solution Place 1 drop into both eyes at bedtime.  [name] lisinopril-hydrochlorothiazide (ZESTORETIC) 20-12.5 MG tablet TAKE 1 TABLET BY MOUTH DAILY  . metFORMIN (GLUCOPHAGE) 500 MG tablet TAKE 1 TABLET IN THE MORNING AND TAKE ONE-HALF (1/2) TABLET IN THE EVENING  . Multiple Vitamins-Minerals (MULTIVITAMIN & MINERAL PO) Take 1 tablet by mouth daily.   . pregabalin (LYRICA) 75 MG capsule Take 75 mg by mouth 2 (two) times daily.  [name] pyridOXINE (VITAMIN B-6) 100 MG tablet Take 300 mg by mouth daily.  . sildenafil (VIAGRA) 50 MG tablet Take 1 tablet (50 mg total) by mouth as needed for erectile dysfunction.  . vitamin B-12 (CYANOCOBALAMIN) 1000 MCG tablet Take 500 mcg by mouth daily.   . [DISCONTINUED] EPINEPHrine 0.3 mg/0.3 mL IJ SOAJ injection Inject 0.3 mLs (0.3 mg total) into the muscle as needed for anaphylaxis.  . [DISCONTINUED] Pyridoxine HCl (VITAMIN B-6)  500 MG tablet Take 500 mg by mouth 2 (two) times daily.   No facility-administered encounter medications on file as of 02/19/2020.    Activities of Daily Living In your present state of health, do you have any difficulty performing the following activities: 02/19/2020  Hearing? N  Vision? N  Difficulty concentrating or making decisions? N  Walking or climbing stairs? N  Dressing or bathing? N  Doing errands, shopping? Y  Comment wife drives  Conservation officer, nature and eating ? N  Using the Toilet? N  In the past  six months, have you accidently leaked urine? N  Do you have problems with loss of bowel control? N  Managing your Medications? N  Managing your Finances? N  Housekeeping or managing your Housekeeping? N  Some recent data might be hidden    Patient Care Team: Kermit Balo, DO as PCP - General (Geriatric Medicine) [name], MD as Consulting Physician (Ophthalmology) [name], MD as Consulting Physician (Gastroenterology)   Assessment:   This is a routine wellness examination for [place].  Exercise Activities and Dietary recommendations Current Exercise Habits: The patient does not participate in regular exercise at present, Exercise limited by: None identified  Goals    . Increase water intake     Starting 02/08/17, I will attempt to increase my water intake from 2 bottles per day to 5 bottles per day.        Fall Risk Fall Risk  02/19/2020 01/16/2020 01/12/2020 05/15/2019 02/18/2019  Falls in the past year? 1 0 0 0 0  Number falls in past yr: 0 0 0 0 0  Injury with Fall? 0 0 0 0 0   Is the patient's home free of loose throw rugs in walkways, pet beds, electrical cords, etc?   no      Grab bars in the bathroom? yes      Handrails on the stairs?   no      Adequate lighting?   yes  Depression Screen PHQ 2/9 Scores 02/19/2020 01/12/2020 05/15/2019 02/18/2019  PHQ - 2 Score 0 0 0 0    Cognitive Function MMSE - Mini Mental State Exam 02/18/2019 02/12/2018 02/08/2017  01/03/2016  Orientation to time 5 4 5 5   Orientation to Place 5 5 5 5   Registration 3 3 3 3   Attention/ Calculation 5 5 5 5   Recall 3 2 3 3   Language- name 2 objects 2 2 2 2   Language- repeat 1 1 1 1   Language- follow 3 step command 3 3 3 3   Language- read & follow direction 1 1 1 1   Write a sentence 1 1 1 1   Copy design 1 1 1 1   Total score 30 28 30 30      6CIT Screen 02/19/2020  What Year? 0 points  What month? 0 points  What time? 0 points  Count back from 20 0 points  Months in reverse 0 points  Repeat phrase 2 points  Total Score 2    Immunization History  Administered Date(s) Administered  . Fluad Quad(high Dose 65+) 09/08/2019  . Influenza, High Dose Seasonal PF 08/30/2017, 09/09/2018  . Influenza,inj,Quad PF,6+ Mos 08/31/2014, 09/30/2015, 08/03/2016  . Influenza,inj,quad, With Preservative 09/17/2018  . Influenza-Unspecified 09/17/2012, 09/17/2013  . PFIZER SARS-COV-2 Vaccination 01/09/2020, 01/30/2020  . Pneumococcal Conjugate-13 11/26/2014, 11/27/2014  . Pneumococcal Polysaccharide-23 12/03/2007  . Td 12/03/2007  . Tdap 12/26/2017  . Zoster 03/15/2013  . Zoster Recombinat (Shingrix) 06/01/2017, 11/14/2017    Qualifies for Shingles Vaccine? Up date   Screening Tests Health Maintenance  Topic Date Due  . HEMOGLOBIN A1C  07/07/2020  . OPHTHALMOLOGY EXAM  11/20/2020  . FOOT EXAM  01/11/2021  . TETANUS/TDAP  12/27/2027  . INFLUENZA VACCINE  Completed  . PNA vac Low Risk Adult  Completed   Cancer Screenings: Lung: Low Dose CT Chest recommended if Age 19-80 years, 30 pack-year currently smoking OR have quit w/in 15years. Patient does not qualify. Colorectal: Up to date   Additional Screenings: Hepatitis C Screening: Low Risk  Plan:    I have personally reviewed and noted the following in the patient's chart:   . Medical and social history . Use of alcohol, tobacco or illicit drugs  . Current medications and supplements . Functional ability and  status . Nutritional status . Physical activity . Advanced directives . List of other physicians . Hospitalizations, surgeries, and ER visits in previous 12 months . Vitals . Screenings to include cognitive, depression, and falls . Referrals and appointments  In addition, I have reviewed and discussed with patient certain preventive protocols, quality metrics, and best practice recommendations. A written personalized care plan for preventive services as well as general preventive health recommendations were provided to patient.   Caesar Bookman, NP  02/19/2020

## 2020-02-19 NOTE — Patient Instructions (Signed)
[name] , Thank you for taking time to come for your Medicare Wellness Visit. I appreciate your ongoing commitment to your health goals. Please review the following plan we discussed and let me know if I can assist you in the future.   Screening recommendations/referrals: Colonoscopy: Up to date  Recommended yearly ophthalmology/optometry visit for glaucoma screening and checkup Recommended yearly dental visit for hygiene and checkup  Vaccinations: Influenza vaccine: : Up to date Pneumococcal vaccine : Up to date Tdap vaccine : Up to date due 12/27/2027  Shingles vaccine : Up to date  Advanced directives: No   Conditions/risks identified: Advance Age male > 55 years,Male Gender,Hypertension,Dyslipidemia,Type 2 Diabetes,History of smoking   Next appointment: 1 year   Preventive Care 10 Years and Older, Male Preventive care refers to lifestyle choices and visits with your health care provider that can promote health and wellness. What does preventive care include?  A yearly physical exam. This is also called an annual well check.  Dental exams once or twice a year.  Routine eye exams. Ask your health care provider how often you should have your eyes checked.  Personal lifestyle choices, including:  Daily care of your teeth and gums.  Regular physical activity.  Eating a healthy diet.  Avoiding tobacco and drug use.  Limiting alcohol use.  Practicing safe sex.  Taking low doses of aspirin every day.  Taking vitamin and mineral supplements as recommended by your health care provider. What happens during an annual well check? The services and screenings done by your health care provider during your annual well check will depend on your age, overall health, lifestyle risk factors, and family history of disease. Counseling  Your health care provider may ask you questions about your:  Alcohol use.  Tobacco use.  Drug use.  Emotional well-being.  Home and  relationship well-being.  Sexual activity.  Eating habits.  History of falls.  Memory and ability to understand (cognition).  Work and work Astronomer. Screening  You may have the following tests or measurements:  Height, weight, and BMI.  Blood pressure.  Lipid and cholesterol levels. These may be checked every 5 years, or more frequently if you are over 49 years old.  Skin check.  Lung cancer screening. You may have this screening every year starting at age 61 if you have a 30-pack-year history of smoking and currently smoke or have quit within the past 15 years.  Fecal occult blood test (FOBT) of the stool. You may have this test every year starting at age 36.  Flexible sigmoidoscopy or colonoscopy. You may have a sigmoidoscopy every 5 years or a colonoscopy every 10 years starting at age 20.  Prostate cancer screening. Recommendations will vary depending on your family history and other risks.  Hepatitis C blood test.  Hepatitis B blood test.  Sexually transmitted disease (STD) testing.  Diabetes screening. This is done by checking your blood sugar (glucose) after you have not eaten for a while (fasting). You may have this done every 1-3 years.  Abdominal aortic aneurysm (AAA) screening. You may need this if you are a current or former smoker.  Osteoporosis. You may be screened starting at age 14 if you are at high risk. Talk with your health care provider about your test results, treatment options, and if necessary, the need for more tests. Vaccines  Your health care provider may recommend certain vaccines, such as:  Influenza vaccine. This is recommended every year.  Tetanus, diphtheria, and acellular pertussis (  Tdap, Td) vaccine. You may need a Td booster every 10 years.  Zoster vaccine. You may need this after age 13.  Pneumococcal 13-valent conjugate (PCV13) vaccine. One dose is recommended after age 62.  Pneumococcal polysaccharide (PPSV23) vaccine. One  dose is recommended after age 76. Talk to your health care provider about which screenings and vaccines you need and how often you need them. This information is not intended to replace advice given to you by your health care provider. Make sure you discuss any questions you have with your health care provider. Document Released: 12/31/2015 Document Revised: 08/23/2016 Document Reviewed: 10/05/2015 Elsevier Interactive Patient Education  2017 [place] Prevention in the Home Falls can cause injuries. They can happen to people of all ages. There are many things you can do to make your home safe and to help prevent falls. What can I do on the outside of my home?  Regularly fix the edges of walkways and driveways and fix any cracks.  Remove anything that might make you trip as you walk through a door, such as a raised step or threshold.  Trim any bushes or trees on the path to your home.  Use bright outdoor lighting.  Clear any walking paths of anything that might make someone trip, such as rocks or tools.  Regularly check to see if handrails are loose or broken. Make sure that both sides of any steps have handrails.  Any raised decks and porches should have guardrails on the edges.  Have any leaves, snow, or ice cleared regularly.  Use sand or salt on walking paths during winter.  Clean up any spills in your garage right away. This includes oil or grease spills. What can I do in the bathroom?  Use night lights.  Install grab bars by the toilet and in the tub and shower. Do not use towel bars as grab bars.  Use non-skid mats or decals in the tub or shower.  If you need to sit down in the shower, use a plastic, non-slip stool.  Keep the floor dry. Clean up any water that spills on the floor as soon as it happens.  Remove soap buildup in the tub or shower regularly.  Attach bath mats securely with double-sided non-slip rug tape.  Do not have throw rugs and other  things on the floor that can make you trip. What can I do in the bedroom?  Use night lights.  Make sure that you have a light by your bed that is easy to reach.  Do not use any sheets or blankets that are too big for your bed. They should not hang down onto the floor.  Have a firm chair that has side arms. You can use this for support while you get dressed.  Do not have throw rugs and other things on the floor that can make you trip. What can I do in the kitchen?  Clean up any spills right away.  Avoid walking on wet floors.  Keep items that you use a lot in easy-to-reach places.  If you need to reach something above you, use a strong step stool that has a grab bar.  Keep electrical cords out of the way.  Do not use floor polish or wax that makes floors slippery. If you must use wax, use non-skid floor wax.  Do not have throw rugs and other things on the floor that can make you trip. What can I do with my stairs?  Do  not leave any items on the stairs.  Make sure that there are handrails on both sides of the stairs and use them. Fix handrails that are broken or loose. Make sure that handrails are as long as the stairways.  Check any carpeting to make sure that it is firmly attached to the stairs. Fix any carpet that is loose or worn.  Avoid having throw rugs at the top or bottom of the stairs. If you do have throw rugs, attach them to the floor with carpet tape.  Make sure that you have a light switch at the top of the stairs and the bottom of the stairs. If you do not have them, ask someone to add them for you. What else can I do to help prevent falls?  Wear shoes that:  Do not have high heels.  Have rubber bottoms.  Are comfortable and fit you well.  Are closed at the toe. Do not wear sandals.  If you use a stepladder:  Make sure that it is fully opened. Do not climb a closed stepladder.  Make sure that both sides of the stepladder are locked into place.  Ask  someone to hold it for you, if possible.  Clearly mark and make sure that you can see:  Any grab bars or handrails.  First and last steps.  Where the edge of each step is.  Use tools that help you move around (mobility aids) if they are needed. These include:  Canes.  Walkers.  Scooters.  Crutches.  Turn on the lights when you go into a dark area. Replace any light bulbs as soon as they burn out.  Set up your furniture so you have a clear path. Avoid moving your furniture around.  If any of your floors are uneven, fix them.  If there are any pets around you, be aware of where they are.  Review your medicines with your doctor. Some medicines can make you feel dizzy. This can increase your chance of falling. Ask your doctor what other things that you can do to help prevent falls. This information is not intended to replace advice given to you by your health care provider. Make sure you discuss any questions you have with your health care provider. Document Released: 09/30/2009 Document Revised: 05/11/2016 Document Reviewed: 01/08/2015 Elsevier Interactive Patient Education  2017 [employer].

## 2020-02-25 DIAGNOSIS — Z9889 Other specified postprocedural states: Secondary | ICD-10-CM

## 2020-02-25 HISTORY — PX: HAND SURGERY: SHX662

## 2020-02-25 HISTORY — DX: Other specified postprocedural states: Z98.890

## 2020-05-10 ENCOUNTER — Other Ambulatory Visit: Payer: Medicare HMO

## 2020-05-10 ENCOUNTER — Other Ambulatory Visit: Payer: Self-pay

## 2020-05-10 DIAGNOSIS — E039 Hypothyroidism, unspecified: Secondary | ICD-10-CM

## 2020-05-10 DIAGNOSIS — E1142 Type 2 diabetes mellitus with diabetic polyneuropathy: Secondary | ICD-10-CM

## 2020-05-10 DIAGNOSIS — E1169 Type 2 diabetes mellitus with other specified complication: Secondary | ICD-10-CM

## 2020-05-11 LAB — LIPID PANEL
Cholesterol: 137 mg/dL (ref <200)
HDL: 49 mg/dL (ref >=40)
LDL Cholesterol (Calc): 71 mg/dL (calc)
Non-HDL Cholesterol (Calc): 88 mg/dL (calc) (ref <130)
Total CHOL/HDL Ratio: 2.8 (calc) (ref <5.0)
Triglycerides: 83 mg/dL (ref <150)

## 2020-05-11 LAB — BASIC METABOLIC PANEL WITH GFR
BUN: 9 mg/dL (ref 7–25)
CO2: 25 mmol/L (ref 20–32)
Calcium: 9.8 mg/dL (ref 8.6–10.3)
Chloride: 104 mmol/L (ref 98–110)
Creat: 1.04 mg/dL (ref 0.70–1.18)
GFR, Est African American: 80 mL/min/{1.73_m2} (ref >=60)
GFR, Est Non African American: 69 mL/min/{1.73_m2} (ref >=60)
Glucose, Bld: 111 mg/dL — ABNORMAL HIGH (ref 65–99)
Potassium: 4.1 mmol/L (ref 3.5–5.3)
Sodium: 138 mmol/L (ref 135–146)

## 2020-05-11 LAB — HEMOGLOBIN A1C
Hgb A1c MFr Bld: 5.4 % of total Hgb (ref <5.7)
Mean Plasma Glucose: 108 (calc)
eAG (mmol/L): 6 (calc)

## 2020-05-11 LAB — TSH: TSH: 3.01 mIU/L (ref 0.40–4.50)

## 2020-05-11 NOTE — Progress Notes (Signed)
Thyroid at goal. Sugar average is in normal range now.  It looked to me like he'd lost weight when he came for his labs. Cholesterol is just barely above goal with LDL 71 and goal less than 70. Electrolytes and kidneys are fine.

## 2020-05-13 ENCOUNTER — Encounter: Payer: Self-pay | Admitting: Internal Medicine

## 2020-05-13 ENCOUNTER — Other Ambulatory Visit: Payer: Self-pay

## 2020-05-13 ENCOUNTER — Ambulatory Visit (INDEPENDENT_AMBULATORY_CARE_PROVIDER_SITE_OTHER): Payer: Medicare HMO | Admitting: Internal Medicine

## 2020-05-13 VITALS — BP 130/62 | HR 76 | Temp 97.8°F | Ht 66.0 in | Wt 138.6 lb

## 2020-05-13 DIAGNOSIS — G90511 Complex regional pain syndrome I of right upper limb: Secondary | ICD-10-CM

## 2020-05-13 DIAGNOSIS — Z0000 Encounter for general adult medical examination without abnormal findings: Secondary | ICD-10-CM | POA: Diagnosis not present

## 2020-05-13 DIAGNOSIS — I10 Essential (primary) hypertension: Secondary | ICD-10-CM | POA: Diagnosis not present

## 2020-05-13 DIAGNOSIS — R634 Abnormal weight loss: Secondary | ICD-10-CM

## 2020-05-13 DIAGNOSIS — E039 Hypothyroidism, unspecified: Secondary | ICD-10-CM

## 2020-05-13 DIAGNOSIS — E785 Hyperlipidemia, unspecified: Secondary | ICD-10-CM

## 2020-05-13 DIAGNOSIS — E1142 Type 2 diabetes mellitus with diabetic polyneuropathy: Secondary | ICD-10-CM

## 2020-05-13 DIAGNOSIS — E1169 Type 2 diabetes mellitus with other specified complication: Secondary | ICD-10-CM

## 2020-05-13 NOTE — Patient Instructions (Signed)
Weigh weekly at home.  Let me know if he continues to trend down.

## 2020-05-13 NOTE — Progress Notes (Signed)
Provider:  Gwenith Spitz. Renato Gails, D.O., C.M.D. Location:   [facility]   Place of Service:   clinic  Previous PCP: Kermit Balo, DO Patient Care Team: Kermit Balo, DO as PCP - General (Geriatric Medicine) [name], MD as Consulting Physician (Ophthalmology) [name], MD as Consulting Physician (Gastroenterology)  Extended Emergency Contact Information Primary Emergency Contact: Finamore,Juanita Address: [address]          [place], [place] [address] D[place] of [place] Home Phone: [phone] Mobile Phone: [phone] Relation: Spouse  Code Status: DNR Goals of Care: Advanced Directive information Advanced Directives 05/13/2020  Does Patient Have a Medical Advance Directive? Yes  Type of Advance Directive Living will;Healthcare Power of Attorney  Does patient want to make changes to medical advance directive? No - Patient declined  Copy of Healthcare Power of Attorney in Chart? Yes - validated most recent copy scanned in chart (See row information)  Would patient like information on creating a medical advance directive? -  Pre-existing out of facility DNR order (yellow form or pink MOST form) -   Chief Complaint  Patient presents with  . Annual Exam    annual physical exam     HPI: Patient is a 79 y.o. male seen today for an annual physical exam.  [facility] (90 [facility] in a week).  He's lost 5.2 lbs since last visit.  He's eating the same.  He's also been mowing the lawn.  He only has a pepsi if he goes to [employer] or gets a Tour manager.  Drinks cranberry juice and OJ at home 2-3 times per day.  He has started to drink water.  He gave his devil dog cakes away to the neighbor kids.  He's been elevating his feet when he goes to the bathroom and that has helped him have bms.  Right hand is getting a lot better. He has some swelling.  He can't grip a tea cup well  He has finished his PT.  He can touch all fingers with his thumb now.  Still  tight in palm.  Pain at site of surgery.  Can grip mower and lift stuff, but cannot hold onto things. Has one more f/u.  He was able to do 25 in pressure on the right and 50 on the left.  He is righthanded.  He had another cscope--no more unless a problem comes up.  He had a nose bleed that required cautery.  He does not feel bad.    Past Medical History:  Diagnosis Date  . Abdominal pain, other specified site   . Backache, unspecified   . Carpal tunnel syndrome   . Diabetes mellitus without complication (HCC)   . Essential hypertension, benign   . H/O hand surgery 02/25/2020   R hand   . Hyperlipidemia   . Impacted cerumen   . Impaired fasting glucose   . Other abnormal blood chemistry   . Pain in joint, lower leg   . Special screening for malignant neoplasm of prostate   . Unspecified essential hypertension   . Unspecified hypothyroidism    Past Surgical History:  Procedure Laterality Date  . (L) KNEE SURGERY"TORN MENISCUS"    . EYE SURGERY Left 11/24/2014   cataracts    reports that he quit smoking about 54 years ago. His smoking use included cigarettes. He quit after 13.00 years of use. He has never used smokeless tobacco. He reports that he does not drink  alcohol or use drugs.  Functional Status Survey:    Family History  Problem Relation Age of Onset  . Alzheimer's disease Sister   . Cancer Brother 60       lung  . Diabetes Sister   . Diabetes Sister   . Lung cancer Sister 77       mets to back  . Kidney disease Sister   . Allergies Sister   . Obesity Sister   . Cancer Mother     Health Maintenance  Topic Date Due  . INFLUENZA VACCINE  07/18/2020  . HEMOGLOBIN A1C  11/10/2020  . OPHTHALMOLOGY EXAM  11/20/2020  . FOOT EXAM  01/11/2021  . TETANUS/TDAP  12/27/2027  . COVID-19 Vaccine  Completed  . PNA vac Low Risk Adult  Completed    Allergies  Allergen Reactions  . Penicillins Swelling  . Shellfish Allergy Swelling    Outpatient Encounter  Medications as of 05/13/2020  Medication Sig  . Ascorbic Acid (VITAMIN C) 1000 MG tablet Take 1,000 mg by mouth 2 (two) times daily.  [name] aspirin 81 MG tablet Take 81 mg by mouth daily.   . Aspirin-Salicylamide-Caffeine (BC HEADACHE POWDER PO) Take by mouth as needed.  [name] atorvastatin (LIPITOR) 40 MG tablet TAKE 1 TABLET DAILY TO LOWER CHOLESTEROL  . latanoprost (XALATAN) 0.005 % ophthalmic solution Place 1 drop into both eyes at bedtime.  [name] lisinopril-hydrochlorothiazide (ZESTORETIC) 20-12.5 MG tablet TAKE 1 TABLET BY MOUTH DAILY  . metFORMIN (GLUCOPHAGE) 500 MG tablet TAKE 1 TABLET IN THE MORNING AND TAKE ONE-HALF (1/2) TABLET IN THE EVENING  . Multiple Vitamins-Minerals (MULTIVITAMIN & MINERAL PO) Take 1 tablet by mouth daily.   . pregabalin (LYRICA) 75 MG capsule Take 75 mg by mouth 2 (two) times daily.  [name] pyridOXINE (VITAMIN B-6) 100 MG tablet Take 300 mg by mouth daily.  . sildenafil (VIAGRA) 50 MG tablet Take 1 tablet (50 mg total) by mouth as needed for erectile dysfunction.  . vitamin B-12 (CYANOCOBALAMIN) 1000 MCG tablet Take 500 mcg by mouth daily.   . [DISCONTINUED] vitamin B-12 (CYANOCOBALAMIN) 100 MCG tablet Take 1,000 mg by mouth 2 (two) times daily.   No facility-administered encounter medications on file as of 05/13/2020.    Review of Systems  Constitutional: Positive for weight loss. Negative for chills, fever and malaise/fatigue.  HENT: Positive for hearing loss. Negative for congestion and sore throat.        New dentures that he's not wearing  Eyes:       Stable vision; glaucoma  Respiratory: Negative for cough and shortness of breath.   Cardiovascular: Negative for chest pain, palpitations and leg swelling.  Gastrointestinal: Negative for abdominal pain, blood in stool, constipation, diarrhea and melena.  Genitourinary: Negative for dysuria.  Musculoskeletal: Negative for falls and joint pain.       Right hand pain improving after rotator cuff repair, CT  Skin:  Negative for itching and rash.  Neurological: Positive for sensory change. Negative for dizziness and loss of consciousness.  Psychiatric/Behavioral: Negative for depression and memory loss. The patient is not nervous/anxious and does not have insomnia.     Vitals:   05/13/20 1036  BP: 130/62  Pulse: 76  Temp: 97.8 F (36.6 C)  TempSrc: Temporal  SpO2: 97%  Weight: 138 lb 9.6 oz (62.9 kg)  Height: 5\' 6"  (1.676 m)   Body mass index is 22.37 kg/m. Physical Exam Vitals reviewed.  Constitutional:      General: He is not in  acute distress.    Appearance: Normal appearance. He is not ill-appearing or toxic-appearing.  HENT:     Head: Normocephalic and atraumatic.     Right Ear: External ear normal.     Left Ear: External ear normal.     Nose: Nose normal.     Mouth/Throat:     Pharynx: Oropharynx is clear.  Eyes:     Extraocular Movements: Extraocular movements intact.     Conjunctiva/sclera: Conjunctivae normal.     Pupils: Pupils are equal, round, and reactive to light.  Cardiovascular:     Rate and Rhythm: Normal rate and regular rhythm.     Pulses: Normal pulses.     Heart sounds: Normal heart sounds.  Pulmonary:     Effort: Pulmonary effort is normal.     Breath sounds: Normal breath sounds. No wheezing, rhonchi or rales.  Abdominal:     General: Bowel sounds are normal. There is no distension.     Palpations: Abdomen is soft.     Tenderness: There is no abdominal tenderness. There is no guarding or rebound.  Musculoskeletal:        General: Normal range of motion.     Cervical back: Neck supple.     Right lower leg: No edema.     Left lower leg: No edema.  Lymphadenopathy:     Cervical: No cervical adenopathy.  Skin:    General: Skin is warm and dry.  Neurological:     General: No focal deficit present.     Mental Status: He is alert and oriented to person, place, and time.     Cranial Nerves: No cranial nerve deficit.     Sensory: Sensory deficit present.      Motor: No weakness.     Coordination: Coordination normal.     Gait: Gait normal.     Deep Tendon Reflexes: Reflexes normal.     Comments: Tenderness of forearm near wrist, loss of sensation on proximal palm  Psychiatric:        Mood and Affect: Mood normal.        Behavior: Behavior normal.        Thought Content: Thought content normal.        Judgment: Judgment normal.     Labs reviewed: Basic Metabolic Panel: Recent Labs    09/01/19 0839 01/08/20 0832 05/10/20 0833  NA 141 141 138  K 3.9 4.1 4.1  CL 102 102 104  CO2 32 32 25  GLUCOSE 102* 101* 111*  BUN 10 9 9   CREATININE 0.91 0.98 1.04  CALCIUM 9.9 10.0 9.8   Liver Function Tests: Recent Labs    09/01/19 0839 01/08/20 0832  AST 21 21  ALT 23 24  BILITOT 0.7 0.7  PROT 6.6 6.8   No results for input(s): LIPASE, AMYLASE in the last 8760 hours. No results for input(s): AMMONIA in the last 8760 hours. CBC: Recent Labs    09/01/19 0839 01/08/20 0832  WBC 9.5 9.6  NEUTROABS 5,529 5,760  HGB 15.5 15.8  HCT 45.0 46.6  MCV 88.9 88.3  PLT 296 292   Cardiac Enzymes: No results for input(s): CKTOTAL, CKMB, CKMBINDEX, TROPONINI in the last 8760 hours. BNP: Invalid input(s): POCBNP Lab Results  Component Value Date   HGBA1C 5.4 05/10/2020   Lab Results  Component Value Date   TSH 3.01 05/10/2020   Lab Results  Component Value Date   [id number] 735 10/14/2014   No results found for: FOLATE Lab Results  Component Value Date   IRON 80 10/14/2014   TIBC 298 10/14/2014    Assessment/Plan 1. Annual physical exam -performed today -up to date on preventive care  2. Controlled type 2 diabetes mellitus with diabetic polyneuropathy, without long-term current use of insulin (HCC) -hba1c good despite devil dogs and juices, but  Cholesterol up a little from the devil dogs  3. Essential hypertension, benign -bp well controlled with current regimen and w/o dizziness  4. 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 Hypothyroidism, unspecified type -cont current levothyroxine and f/u tsh about 2x per year  6. Hyperlipidemia associated with type 2 diabetes mellitus (HCC) -LDL just a smidge above goal, counseled on this and he has already given away the devil dogs to a neighbor  7. Weight loss -cause not entirely clear--maybe increased activity with [facility] attendance and mowing which he'd not been doing after his surgery and amid pandemic--monitor weight weekly and Janett Billow will let me know if his weight continues this downward trend  Labs/tests ordered:   Lab Orders     BASIC METABOLIC PANEL WITH GFR     CBC with Differential/Platelet     Hemoglobin A1c     Lipid panel  F/u 09/23/2020  Raylynne Cubbage L. Tula Schryver, D.O. Geriatrics [facility] [address], [place] [address] Cell Phone (Mon-Fri 8am-5pm):  [phone] On Call:  [phone] & follow prompts after 5pm & weekends Office Phone:  [phone] Office Fax:  [phone]

## 2020-05-16 ENCOUNTER — Other Ambulatory Visit: Payer: Self-pay | Admitting: Internal Medicine

## 2020-05-18 NOTE — Telephone Encounter (Signed)
rx sent to pharmacy by e-script  

## 2020-06-03 ENCOUNTER — Other Ambulatory Visit: Payer: Self-pay | Admitting: Internal Medicine

## 2020-06-03 DIAGNOSIS — R809 Proteinuria, unspecified: Secondary | ICD-10-CM

## 2020-06-03 DIAGNOSIS — E1129 Type 2 diabetes mellitus with other diabetic kidney complication: Secondary | ICD-10-CM

## 2020-06-03 MED ORDER — LISINOPRIL-HYDROCHLOROTHIAZIDE 20-12.5 MG PO TABS: 1.0000 | ORAL_TABLET | Freq: Every day | ORAL | 1 refills | Status: DC

## 2020-06-24 ENCOUNTER — Telehealth: Payer: Self-pay

## 2020-06-24 NOTE — Telephone Encounter (Signed)
Patient called to check the status of message. Patient is aware message was sent to Bufford Spikes L, DO and we will call once a reply is available.

## 2020-06-24 NOTE — Telephone Encounter (Signed)
Discussed Dr.Reed's response with patient.

## 2020-06-24 NOTE — Telephone Encounter (Signed)
It's ok to use the voltaren gel despite his mild chronic kidney disease.  It only goes into the system by 10%.

## 2020-06-24 NOTE — Telephone Encounter (Signed)
Patient called asking if it was okay to use Voltaren gel for arthritic pain. He states that there are warnings to not use if you have kidney disease. I don't see that he has kidney disease on his problem list.

## 2020-07-22 ENCOUNTER — Other Ambulatory Visit: Payer: Self-pay | Admitting: Internal Medicine

## 2020-07-22 DIAGNOSIS — R809 Proteinuria, unspecified: Secondary | ICD-10-CM

## 2020-09-06 ENCOUNTER — Ambulatory Visit: Payer: Self-pay

## 2020-09-07 ENCOUNTER — Other Ambulatory Visit: Payer: Self-pay

## 2020-09-07 ENCOUNTER — Ambulatory Visit (INDEPENDENT_AMBULATORY_CARE_PROVIDER_SITE_OTHER): Payer: Medicare HMO | Admitting: *Deleted

## 2020-09-07 DIAGNOSIS — Z23 Encounter for immunization: Secondary | ICD-10-CM | POA: Diagnosis not present

## 2020-09-20 ENCOUNTER — Other Ambulatory Visit: Payer: Medicare HMO

## 2020-09-20 ENCOUNTER — Other Ambulatory Visit: Payer: Self-pay

## 2020-09-20 DIAGNOSIS — E1169 Type 2 diabetes mellitus with other specified complication: Secondary | ICD-10-CM

## 2020-09-20 DIAGNOSIS — I10 Essential (primary) hypertension: Secondary | ICD-10-CM

## 2020-09-20 DIAGNOSIS — Z0000 Encounter for general adult medical examination without abnormal findings: Secondary | ICD-10-CM

## 2020-09-20 DIAGNOSIS — E1142 Type 2 diabetes mellitus with diabetic polyneuropathy: Secondary | ICD-10-CM

## 2020-09-20 DIAGNOSIS — R634 Abnormal weight loss: Secondary | ICD-10-CM

## 2020-09-21 LAB — BASIC METABOLIC PANEL WITH GFR
BUN: 11 mg/dL (ref 7–25)
CO2: 30 mmol/L (ref 20–32)
Calcium: 9.3 mg/dL (ref 8.6–10.3)
Chloride: 103 mmol/L (ref 98–110)
Creat: 1 mg/dL (ref 0.70–1.18)
GFR, Est African American: 83 mL/min/{1.73_m2} (ref >=60)
GFR, Est Non African American: 72 mL/min/{1.73_m2} (ref >=60)
Glucose, Bld: 95 mg/dL (ref 65–99)
Potassium: 4.1 mmol/L (ref 3.5–5.3)
Sodium: 140 mmol/L (ref 135–146)

## 2020-09-21 LAB — CBC WITH DIFFERENTIAL/PLATELET
Absolute Monocytes: 670 cells/uL (ref 200–950)
Basophils Absolute: 52 cells/uL (ref 0–200)
Basophils Relative: 0.6 %
Eosinophils Absolute: 287 cells/uL (ref 15–500)
Eosinophils Relative: 3.3 %
HCT: 43.2 % (ref 38.5–50.0)
Hemoglobin: 14.3 g/dL (ref 13.2–17.1)
Lymphs Abs: 2366 cells/uL (ref 850–3900)
MCH: 30.4 pg (ref 27.0–33.0)
MCHC: 33.1 g/dL (ref 32.0–36.0)
MCV: 91.9 fL (ref 80.0–100.0)
MPV: 11.1 fL (ref 7.5–12.5)
Monocytes Relative: 7.7 %
Neutro Abs: 5324 cells/uL (ref 1500–7800)
Neutrophils Relative %: 61.2 %
Platelets: 273 10*3/uL (ref 140–400)
RBC: 4.7 10*6/uL (ref 4.20–5.80)
RDW: 11.5 % (ref 11.0–15.0)
Total Lymphocyte: 27.2 %
WBC: 8.7 10*3/uL (ref 3.8–10.8)

## 2020-09-21 LAB — LIPID PANEL
Cholesterol: 142 mg/dL (ref <200)
HDL: 48 mg/dL (ref >=40)
LDL Cholesterol (Calc): 75 mg/dL (calc)
Non-HDL Cholesterol (Calc): 94 mg/dL (calc) (ref <130)
Total CHOL/HDL Ratio: 3 (calc) (ref <5.0)
Triglycerides: 107 mg/dL (ref <150)

## 2020-09-21 LAB — HEMOGLOBIN A1C
Hgb A1c MFr Bld: 5.7 % of total Hgb — ABNORMAL HIGH (ref <5.7)
Mean Plasma Glucose: 117 (calc)
eAG (mmol/L): 6.5 (calc)

## 2020-09-21 NOTE — Progress Notes (Signed)
Tyler Hull's bad cholesterol and sugar have both trended up.  We'll figure out why at his appt.  Blood counts, kidneys and electrolytes are all ok.

## 2020-09-23 ENCOUNTER — Encounter: Payer: Self-pay | Admitting: Internal Medicine

## 2020-09-23 ENCOUNTER — Other Ambulatory Visit: Payer: Self-pay

## 2020-09-23 ENCOUNTER — Ambulatory Visit (INDEPENDENT_AMBULATORY_CARE_PROVIDER_SITE_OTHER): Payer: Medicare HMO | Admitting: Internal Medicine

## 2020-09-23 VITALS — BP 138/78 | HR 67 | Temp 97.8°F | Ht 66.0 in | Wt 141.1 lb

## 2020-09-23 DIAGNOSIS — E039 Hypothyroidism, unspecified: Secondary | ICD-10-CM

## 2020-09-23 DIAGNOSIS — E1169 Type 2 diabetes mellitus with other specified complication: Secondary | ICD-10-CM

## 2020-09-23 DIAGNOSIS — E1142 Type 2 diabetes mellitus with diabetic polyneuropathy: Secondary | ICD-10-CM | POA: Diagnosis not present

## 2020-09-23 DIAGNOSIS — I10 Essential (primary) hypertension: Secondary | ICD-10-CM

## 2020-09-23 DIAGNOSIS — M24272 Disorder of ligament, left ankle: Secondary | ICD-10-CM

## 2020-09-23 DIAGNOSIS — S300XXA Contusion of lower back and pelvis, initial encounter: Secondary | ICD-10-CM

## 2020-09-23 DIAGNOSIS — E785 Hyperlipidemia, unspecified: Secondary | ICD-10-CM

## 2020-09-23 NOTE — Progress Notes (Signed)
Location:  [facility] clinic Provider:  Demara Lover L. Renato Gails, D.O., C.M.D.  Goals of Care:  Advanced Directives 09/23/2020  Does Patient Have a Medical Advance Directive? Yes  Type of Estate agent of [place];Living will  Does patient want to make changes to medical advance directive? No - Patient declined  Copy of Healthcare Power of Attorney in Chart? Yes - validated most recent copy scanned in chart (See row information)  Would patient like information on creating a medical advance directive? -  Pre-existing out of facility DNR order (yellow form or pink MOST form) -   Chief Complaint  Patient presents with  . Medical Management of Chronic Issues    44month follow up   . Health Maintenance    Hep C     HPI: Patient is a 79 y.o. male seen today for medical management of chronic diseases.    He ate quite a bit on his cruise including small desserts and breads.  They show me pics.  There were a lot of hoops to jump through re: covid.  Lots of hand sanitizer.  Masks everywhere except on deck or dining.  The number of passengers was reduced also.  99% crew vaccinated (except little kids).  Only 6 per elevator.  They had a good time.    3-4 weeks before, he had a fold-up chair.  He had a plastic seat cover on it.  It sent down and he fell down onto concrete on his bottom.  His right buttocks is sore.  If he turns a certain way, it's bad.  Worse when he leans on it.  Two aleves don't work.  Not painful to walk around.  Does hurt a little when he sits on it.     Right hand still hurts him when he bends his fingers--they hurt on the dorsal surface.  No more odd temperature sensation.  He's still doing the exercises s/p CTS.  He can lay his hand flat now.    Past Medical History:  Diagnosis Date  . Abdominal pain, other specified site   . Backache, unspecified   . Carpal tunnel syndrome   . Diabetes mellitus without complication (HCC)   . Essential hypertension, benign   . H/O  hand surgery 02/25/2020   R hand   . Hyperlipidemia   . Impacted cerumen   . Impaired fasting glucose   . Other abnormal blood chemistry   . Pain in joint, lower leg   . Special screening for malignant neoplasm of prostate   . Unspecified essential hypertension   . Unspecified hypothyroidism     Past Surgical History:  Procedure Laterality Date  . (L) KNEE SURGERY"TORN MENISCUS"    . EYE SURGERY Left 11/24/2014   cataracts    Allergies  Allergen Reactions  . Penicillins Swelling  . Shellfish Allergy Swelling    Outpatient Encounter Medications as of 09/23/2020  Medication Sig  . Ascorbic Acid (VITAMIN C) 1000 MG tablet Take 1,000 mg by mouth 2 (two) times daily.  [name] aspirin 81 MG tablet Take 81 mg by mouth daily.   . Aspirin-Salicylamide-Caffeine (BC HEADACHE POWDER PO) Take by mouth as needed.  [name] atorvastatin (LIPITOR) 40 MG tablet TAKE 1 TABLET DAILY TO LOWER CHOLESTEROL  . latanoprost (XALATAN) 0.005 % ophthalmic solution Place 1 drop into both eyes at bedtime.  [name] lisinopril-hydrochlorothiazide (ZESTORETIC) 20-12.5 MG tablet TAKE 1 TABLET BY MOUTH DAILY  . metFORMIN (GLUCOPHAGE) 500 MG tablet TAKE 1 TABLET IN THE MORNING AND  TAKE ONE-HALF (1/2) TABLET IN THE EVENING  . Multiple Vitamins-Minerals (MULTIVITAMIN & MINERAL PO) Take 1 tablet by mouth daily.   . pregabalin (LYRICA) 75 MG capsule Take 75 mg by mouth 2 (two) times daily.  [name] pyridOXINE (VITAMIN B-6) 100 MG tablet Take 300 mg by mouth daily.  . sildenafil (VIAGRA) 50 MG tablet Take 1 tablet (50 mg total) by mouth as needed for erectile dysfunction.  . vitamin B-12 (CYANOCOBALAMIN) 1000 MCG tablet Take 500 mcg by mouth daily.    No facility-administered encounter medications on file as of 09/23/2020.    Review of Systems:  Review of Systems  Constitutional: Negative for chills, fever and malaise/fatigue.  HENT: Negative for congestion, hearing loss and sore throat.   Eyes: Negative for blurred vision.   Respiratory: Negative for cough and shortness of breath.   Cardiovascular: Negative for chest pain, palpitations and leg swelling.  Gastrointestinal: Negative for abdominal pain, blood in stool, constipation, diarrhea and melena.  Genitourinary: Negative for dysuria.  Musculoskeletal: Positive for falls, joint pain and myalgias.       Right buttock, laxity of left ankle   Skin: Negative for itching and rash.  Neurological: Negative for dizziness and loss of consciousness.  Psychiatric/Behavioral: Negative for memory loss. The patient is not nervous/anxious and does not have insomnia.     Health Maintenance  Topic Date Due  . Hepatitis C Screening  Never done  . OPHTHALMOLOGY EXAM  11/20/2020  . FOOT EXAM  01/11/2021  . HEMOGLOBIN A1C  03/21/2021  . TETANUS/TDAP  12/27/2027  . INFLUENZA VACCINE  Completed  . COVID-19 Vaccine  Completed  . PNA vac Low Risk Adult  Completed    Physical Exam: Vitals:   09/23/20 1037  BP: 138/78  Pulse: 67  Temp: 97.8 F (36.6 C)  TempSrc: Temporal  SpO2: 99%  Weight: 141 lb 1.6 oz (64 kg)  Height: 5\' 6"  (1.676 m)   Body mass index is 22.77 kg/m. Physical Exam Vitals reviewed.  Constitutional:      General: He is not in acute distress.    Appearance: Normal appearance. He is not toxic-appearing.  HENT:     Head: Normocephalic and atraumatic.  Cardiovascular:     Rate and Rhythm: Normal rate and regular rhythm.     Pulses: Normal pulses.     Heart sounds: Murmur heard.   Pulmonary:     Effort: Pulmonary effort is normal.     Breath sounds: Normal breath sounds. No wheezing, rhonchi or rales.  Musculoskeletal:        General: Normal range of motion.     Comments: Tender over right buttock--firm area present on right not on left (suspect hematoma deep), left ankle not grossly weak or tender  Skin:    General: Skin is warm and dry.  Neurological:     General: No focal deficit present.     Mental Status: He is alert and oriented to  person, place, and time.  Psychiatric:        Mood and Affect: Mood normal.     Labs reviewed: Basic Metabolic Panel: Recent Labs    01/08/20 0832 05/10/20 0833 09/20/20 0829  NA 141 138 140  K 4.1 4.1 4.1  CL 102 104 103  CO2 32 25 30  GLUCOSE 101* 111* 95  BUN 9 9 11   CREATININE 0.98 1.04 1.00  CALCIUM 10.0 9.8 9.3  TSH 4.35 3.01  --    Liver Function Tests: Recent Labs  01/08/20 0832  AST 21  ALT 24  BILITOT 0.7  PROT 6.8   No results for input(s): LIPASE, AMYLASE in the last 8760 hours. No results for input(s): AMMONIA in the last 8760 hours. CBC: Recent Labs    01/08/20 0832 09/20/20 0829  WBC 9.6 8.7  NEUTROABS 5,760 5,324  HGB 15.8 14.3  HCT 46.6 43.2  MCV 88.3 91.9  PLT 292 273   Lipid Panel: Recent Labs    01/08/20 0832 05/10/20 0833 09/20/20 0829  CHOL 139 137 142  HDL 52 49 48  LDLCALC 69 71 75  TRIG 98 83 107  CHOLHDL 2.7 2.8 3.0   Lab Results  Component Value Date   HGBA1C 5.7 (H) 09/20/2020    Assessment/Plan 1. Controlled type 2 diabetes mellitus with diabetic polyneuropathy, without long-term current use of insulin (HCC) - hba1c trended up with cruise, should get better when back at gym and lays of pepsi some - CBC with Differential/Platelet; Future - COMPLETE METABOLIC PANEL WITH GFR; Future - Lipid panel; Future - Hemoglobin A1c; Future  2. Essential hypertension, benign - bp at goal with current therapy, cont same - COMPLETE METABOLIC PANEL WITH GFR; Future  3. Hypothyroidism, unspecified type - cont current levothyroxine, monitor - TSH; Future  4. Hyperlipidemia associated with type 2 diabetes mellitus (HCC) - cholesterol trended up with cruise and desserts, expect it will be better in 4 mos - COMPLETE METABOLIC PANEL WITH GFR; Future - Lipid panel; Future  5. Traumatic hematoma of buttock, initial encounter - from fall at home - recommended heat and stretches (see instructions) - CBC with  Differential/Platelet; Future  6. Ligamentous laxity of left ankle -recommended he wear a brace during mowing, use voltaren for pain  Labs/tests ordered:   Lab Orders     CBC with Differential/Platelet     COMPLETE METABOLIC PANEL WITH GFR     Lipid panel     Hemoglobin A1c     TSH   Next appt:  02/24/2021   Justinian Miano L. Yovany Clock, D.O. Geriatrics [facility] [address], [place] [address] Cell Phone (Mon-Fri 8am-5pm):  [phone] On Call:  [phone] & follow prompts after 5pm & weekends Office Phone:  [phone] Office Fax:  [phone]

## 2020-09-23 NOTE — Patient Instructions (Addendum)
Put voltaren gel on your left ankle when it bothers you.  You may use it up to 4 times a day on there.  When you mow the yard, use a support brace on your left ankle.    For your sore bottom from your fall, apply a heating pad 20 minutes up to 4 times a day.  Stretch your bottom with a set of exercises/stretches.  Lie on your back and cross your one leg over the other with it bent.

## 2020-11-26 ENCOUNTER — Other Ambulatory Visit: Payer: Self-pay | Admitting: Internal Medicine

## 2020-11-26 DIAGNOSIS — N529 Male erectile dysfunction, unspecified: Secondary | ICD-10-CM

## 2021-01-03 ENCOUNTER — Other Ambulatory Visit: Payer: Self-pay | Admitting: Internal Medicine

## 2021-01-03 DIAGNOSIS — R809 Proteinuria, unspecified: Secondary | ICD-10-CM

## 2021-01-03 DIAGNOSIS — E1129 Type 2 diabetes mellitus with other diabetic kidney complication: Secondary | ICD-10-CM

## 2021-01-21 ENCOUNTER — Other Ambulatory Visit: Payer: Self-pay | Admitting: Internal Medicine

## 2021-01-24 ENCOUNTER — Other Ambulatory Visit: Payer: Self-pay

## 2021-01-24 ENCOUNTER — Other Ambulatory Visit: Payer: Medicare HMO

## 2021-01-24 DIAGNOSIS — E1142 Type 2 diabetes mellitus with diabetic polyneuropathy: Secondary | ICD-10-CM

## 2021-01-24 DIAGNOSIS — I10 Essential (primary) hypertension: Secondary | ICD-10-CM

## 2021-01-24 DIAGNOSIS — E1169 Type 2 diabetes mellitus with other specified complication: Secondary | ICD-10-CM

## 2021-01-24 DIAGNOSIS — S300XXA Contusion of lower back and pelvis, initial encounter: Secondary | ICD-10-CM

## 2021-01-24 DIAGNOSIS — E039 Hypothyroidism, unspecified: Secondary | ICD-10-CM

## 2021-01-25 LAB — CBC WITH DIFFERENTIAL/PLATELET
Absolute Monocytes: 725 cells/uL (ref 200–950)
Basophils Absolute: 39 cells/uL (ref 0–200)
Basophils Relative: 0.4 %
Eosinophils Absolute: 294 cells/uL (ref 15–500)
Eosinophils Relative: 3 %
HCT: 45.7 % (ref 38.5–50.0)
Hemoglobin: 15.5 g/dL (ref 13.2–17.1)
Lymphs Abs: 2626 cells/uL (ref 850–3900)
MCH: 30.5 pg (ref 27.0–33.0)
MCHC: 33.9 g/dL (ref 32.0–36.0)
MCV: 90 fL (ref 80.0–100.0)
MPV: 11.3 fL (ref 7.5–12.5)
Monocytes Relative: 7.4 %
Neutro Abs: 6115 cells/uL (ref 1500–7800)
Neutrophils Relative %: 62.4 %
Platelets: 314 10*3/uL (ref 140–400)
RBC: 5.08 10*6/uL (ref 4.20–5.80)
RDW: 11.7 % (ref 11.0–15.0)
Total Lymphocyte: 26.8 %
WBC: 9.8 10*3/uL (ref 3.8–10.8)

## 2021-01-25 LAB — COMPLETE METABOLIC PANEL WITH GFR
AG Ratio: 1.8 (calc) (ref 1.0–2.5)
ALT: 17 U/L (ref 9–46)
AST: 18 U/L (ref 10–35)
Albumin: 4.5 g/dL (ref 3.6–5.1)
Alkaline phosphatase (APISO): 74 U/L (ref 35–144)
BUN: 15 mg/dL (ref 7–25)
CO2: 31 mmol/L (ref 20–32)
Calcium: 9.6 mg/dL (ref 8.6–10.3)
Chloride: 103 mmol/L (ref 98–110)
Creat: 0.94 mg/dL (ref 0.70–1.18)
GFR, Est African American: 90 mL/min/{1.73_m2} (ref >=60)
GFR, Est Non African American: 77 mL/min/{1.73_m2} (ref >=60)
Globulin: 2.5 g/dL (calc) (ref 1.9–3.7)
Glucose, Bld: 91 mg/dL (ref 65–99)
Potassium: 4.2 mmol/L (ref 3.5–5.3)
Sodium: 141 mmol/L (ref 135–146)
Total Bilirubin: 0.5 mg/dL (ref 0.2–1.2)
Total Protein: 7 g/dL (ref 6.1–8.1)

## 2021-01-25 LAB — LIPID PANEL
Cholesterol: 125 mg/dL (ref <200)
HDL: 47 mg/dL (ref >=40)
LDL Cholesterol (Calc): 62 mg/dL (calc)
Non-HDL Cholesterol (Calc): 78 mg/dL (calc) (ref <130)
Total CHOL/HDL Ratio: 2.7 (calc) (ref <5.0)
Triglycerides: 82 mg/dL (ref <150)

## 2021-01-25 LAB — HEMOGLOBIN A1C
Hgb A1c MFr Bld: 5.7 % of total Hgb — ABNORMAL HIGH (ref <5.7)
Mean Plasma Glucose: 117 mg/dL
eAG (mmol/L): 6.5 mmol/L

## 2021-01-25 LAB — TSH: TSH: 2.83 mIU/L (ref 0.40–4.50)

## 2021-01-25 NOTE — Progress Notes (Signed)
Sugar average is unchanged from last time. Cholesterol is at goal again. Blood counts, liver, kidneys, electrolytes and thyroid are all in normal range.

## 2021-01-27 ENCOUNTER — Other Ambulatory Visit: Payer: Self-pay | Admitting: Internal Medicine

## 2021-01-27 ENCOUNTER — Encounter: Payer: Self-pay | Admitting: Internal Medicine

## 2021-01-27 ENCOUNTER — Ambulatory Visit (INDEPENDENT_AMBULATORY_CARE_PROVIDER_SITE_OTHER): Payer: Medicare HMO | Admitting: Internal Medicine

## 2021-01-27 ENCOUNTER — Other Ambulatory Visit: Payer: Self-pay

## 2021-01-27 VITALS — BP 128/70 | HR 56 | Temp 97.1°F | Ht 66.0 in | Wt 140.8 lb

## 2021-01-27 DIAGNOSIS — L821 Other seborrheic keratosis: Secondary | ICD-10-CM | POA: Diagnosis not present

## 2021-01-27 DIAGNOSIS — K649 Unspecified hemorrhoids: Secondary | ICD-10-CM

## 2021-01-27 DIAGNOSIS — I700 Atherosclerosis of aorta: Secondary | ICD-10-CM

## 2021-01-27 DIAGNOSIS — I10 Essential (primary) hypertension: Secondary | ICD-10-CM

## 2021-01-27 DIAGNOSIS — E1142 Type 2 diabetes mellitus with diabetic polyneuropathy: Secondary | ICD-10-CM

## 2021-01-27 DIAGNOSIS — E039 Hypothyroidism, unspecified: Secondary | ICD-10-CM

## 2021-01-27 DIAGNOSIS — E1169 Type 2 diabetes mellitus with other specified complication: Secondary | ICD-10-CM | POA: Diagnosis not present

## 2021-01-27 DIAGNOSIS — E785 Hyperlipidemia, unspecified: Secondary | ICD-10-CM

## 2021-01-27 DIAGNOSIS — K219 Gastro-esophageal reflux disease without esophagitis: Secondary | ICD-10-CM

## 2021-01-27 MED ORDER — OMEPRAZOLE 20 MG PO CPDR
20.0000 mg | DELAYED_RELEASE_CAPSULE | Freq: Every day | ORAL | 1 refills | Status: DC
Start: 2021-01-27 — End: 2021-08-16

## 2021-01-27 NOTE — Progress Notes (Signed)
Location:  [facility] clinic Provider:  Danyl Deems L. Renato Gails, D.O., C.M.D.  Goals of Care:  Advanced Directives 01/27/2021  Does Patient Have a Medical Advance Directive? Yes  Type of Estate agent of [place];Living will  Does patient want to make changes to medical advance directive? No - Patient declined  Copy of Healthcare Power of Attorney in Chart? Yes - validated most recent copy scanned in chart (See row information)  Would patient like information on creating a medical advance directive? -  Pre-existing out of facility DNR order (yellow form or pink MOST form) -     Chief Complaint  Patient presents with  . Medical Management of Chronic Issues    4 month follow up. Discuss bump on his back     HPI: Patient is a 80 y.o. male seen today for medical management of chronic diseases.    Concerned about a bump on his back--looks like a keratosis on right lower back area.    Hemorrhoids:  Had bleeding going on for a month and a half after first banding.  Had banding done--third session was yesterday.  No bleeding since.  Could recur.    Has eye exam next week.    His sugar average remains 5.7 and he had a whole case of pepsi.   He had it gradually.    Gets a slight pain in his stomach sometimes when his pants are tight.  Says it feels like if he took too many aspirins.  He will feel it at times at night and has some right now sitting here.  No relation to meals.    [facility]  [facility] and usually go Thursday for chair yoga.    Has stiffness now in left hand like he had in right hand before.  Still has scar tissue in right hand, but mobility is a lot better.    Past Medical History:  Diagnosis Date  . Abdominal pain, other specified site   . Backache, unspecified   . Carpal tunnel syndrome   . Diabetes mellitus without complication (HCC)   . Essential hypertension, benign   . H/O hand surgery 02/25/2020   R hand   .  Hyperlipidemia   . Impacted cerumen   . Impaired fasting glucose   . Other abnormal blood chemistry   . Pain in joint, lower leg   . Special screening for malignant neoplasm of prostate   . Unspecified essential hypertension   . Unspecified hypothyroidism     Past Surgical History:  Procedure Laterality Date  . (L) KNEE SURGERY"TORN MENISCUS"    . EYE SURGERY Left 11/24/2014   cataracts    Allergies  Allergen Reactions  . Penicillins Swelling  . Shellfish Allergy Swelling    Outpatient Encounter Medications as of 01/27/2021  Medication Sig  . Ascorbic Acid (VITAMIN C) 1000 MG tablet Take 1,000 mg by mouth 2 (two) times daily.  [name] aspirin 81 MG tablet Take 81 mg by mouth daily.  . Aspirin-Salicylamide-Caffeine (BC HEADACHE POWDER PO) Take by mouth as needed.  [name] atorvastatin (LIPITOR) 40 MG tablet TAKE 1 TABLET DAILY TO LOWER CHOLESTEROL  . latanoprost (XALATAN) 0.005 % ophthalmic solution Place 1 drop into both eyes at bedtime.  [name] lisinopril-hydrochlorothiazide (ZESTORETIC) 20-12.5 MG tablet TAKE 1 TABLET DAILY  . metFORMIN (GLUCOPHAGE) 500 MG tablet TAKE 1 TABLET IN THE MORNING AND TAKE ONE-HALF (1/2) TABLET IN THE EVENING  . Multiple Vitamins-Minerals (MULTIVITAMIN & MINERAL  PO) Take 1 tablet by mouth daily.  . pregabalin (LYRICA) 75 MG capsule Take 75 mg by mouth 2 (two) times daily.  . sildenafil (VIAGRA) 50 MG tablet TAKE 1 TABLET AS NEEDED FOR ERECTILE DYSFUNCTION  . vitamin B-12 (CYANOCOBALAMIN) 1000 MCG tablet Take 500 mcg by mouth daily.   . [DISCONTINUED] pyridOXINE (VITAMIN B-6) 100 MG tablet Take 300 mg by mouth daily.   No facility-administered encounter medications on file as of 01/27/2021.    Review of Systems:  Review of Systems  Constitutional: Negative for fever and malaise/fatigue.  HENT: Positive for hearing loss. Negative for congestion and sore throat.   Eyes: Negative for blurred vision.  Respiratory: Negative for cough and shortness of breath.    Cardiovascular: Negative for chest pain, palpitations and leg swelling.  Gastrointestinal: Positive for abdominal pain and heartburn. Negative for blood in stool, constipation, diarrhea, melena, nausea and vomiting.  Genitourinary: Negative for dysuria.  Musculoskeletal: Negative for falls, joint pain and myalgias.  Skin: Negative for itching and rash.  Neurological: Negative for dizziness and loss of consciousness.  Psychiatric/Behavioral: Negative for depression and memory loss. The patient is not nervous/anxious and does not have insomnia.     Health Maintenance  Topic Date Due  . Hepatitis C Screening  Never done  . OPHTHALMOLOGY EXAM  11/20/2020  . FOOT EXAM  01/11/2021  . HEMOGLOBIN A1C  07/24/2021  . TETANUS/TDAP  12/27/2027  . INFLUENZA VACCINE  Completed  . COVID-19 Vaccine  Completed  . PNA vac Low Risk Adult  Completed    Physical Exam: Vitals:   01/27/21 1042  BP: 128/70  Pulse: (!) 56  Temp: (!) 97.1 F (36.2 C)  TempSrc: Temporal  SpO2: 98%  Weight: 140 lb 12.8 oz (63.9 kg)  Height: 5\' 6"  (1.676 m)   Body mass index is 22.73 kg/m. Physical Exam Vitals reviewed.  Constitutional:      Appearance: Normal appearance.  HENT:     Head: Normocephalic and atraumatic.  Eyes:     Comments: glasses  Cardiovascular:     Rate and Rhythm: Normal rate and regular rhythm.     Pulses: Normal pulses.     Heart sounds: Normal heart sounds.  Pulmonary:     Effort: Pulmonary effort is normal.     Breath sounds: Normal breath sounds. No wheezing, rhonchi or rales.  Abdominal:     General: Bowel sounds are normal. There is no distension.     Palpations: Abdomen is soft. There is no mass.     Tenderness: There is abdominal tenderness. There is no guarding or rebound.     Comments: Lower abdomen/suprapubic area slightly tenderness  Musculoskeletal:        General: Normal range of motion.     Right lower leg: No edema.     Left lower leg: No edema.  Skin:     Comments: Pencil eraser sized raised, scaly skin toned papule on right lower back  Neurological:     General: No focal deficit present.     Mental Status: He is alert and oriented to person, place, and time.  Psychiatric:        Mood and Affect: Mood normal.        Behavior: Behavior normal.     Labs reviewed: Basic Metabolic Panel: Recent Labs    05/10/20 0833 09/20/20 0829 01/24/21 0859  NA 138 140 141  K 4.1 4.1 4.2  CL 104 103 103  CO2 25 30 31   GLUCOSE  111* 95 91  BUN 9 11 15   CREATININE 1.04 1.00 0.94  CALCIUM 9.8 9.3 9.6  TSH 3.01  --  2.83   Liver Function Tests: Recent Labs    01/24/21 0859  AST 18  ALT 17  BILITOT 0.5  PROT 7.0   No results for input(s): LIPASE, AMYLASE in the last 8760 hours. No results for input(s): AMMONIA in the last 8760 hours. CBC: Recent Labs    09/20/20 0829 01/24/21 0859  WBC 8.7 9.8  NEUTROABS 5,324 6,115  HGB 14.3 15.5  HCT 43.2 45.7  MCV 91.9 90.0  PLT 273 314   Lipid Panel: Recent Labs    05/10/20 0833 09/20/20 0829 01/24/21 0859  CHOL 137 142 125  HDL 49 48 47  LDLCALC 71 75 62  TRIG 83 107 82  CHOLHDL 2.8 3.0 2.7   Lab Results  Component Value Date   HGBA1C 5.7 (H) 01/24/2021    Procedures since last visit: No results found.  Assessment/Plan 1. Seborrheic keratosis -of right lower back -advised that if it grows, changes or he's bothered by it, derm can freeze it  2. Hyperlipidemia associated with type 2 diabetes mellitus (HCC) -back to goal, cont lipitor therapy   3. Hemorrhoids, unspecified hemorrhoid type -s/p three banding sessions, no further bleeding since procedure yesterday -he's following the directions Dr. 03/24/2021 gave him  4. Abdominal aortic atherosclerosis (HCC) -cont bp, lipid and glucose control  5. Controlled type 2 diabetes mellitus with diabetic polyneuropathy, without long-term current use of insulin (HCC) -needs foot exam next time -also has eye exam next week so we can  abstract it next time if faxed by ophtho - Hemoglobin A1c; Future - COMPLETE METABOLIC PANEL WITH GFR; Future -control good if he stays away from pepsi -cont exercise classes and staying active with church projects and home projects  6. Essential hypertension, benign -bp at goal with current regimen, cont same and monitor  7. Hypothyroidism, unspecified type -cont current levothyroxine - TSH; Future  8. Gastroesophageal reflux disease, unspecified whether esophagitis present - do trial of 6 weeks of prilosec to see if this helps the discomfort he's getting -if not improved, f/u with Dr. Elnoria Howard -omeprazole (PRILOSEC) 20 MG capsule; Take 1 capsule (20 mg total) by mouth daily before supper.  Dispense: 30 capsule; Refill: 1  Labs/tests ordered:   Lab Orders     Hemoglobin A1c     COMPLETE METABOLIC PANEL WITH GFR     TSH  Next appt:  F/u in June with Amy with fasting labs before (Nita was going to change to Amy also)  Nyzier Boivin L. Glenda Kunst, D.O. Geriatrics [facility] [address], [place] Cell Phone (Mon-Fri 8am-5pm):  [phone] On Call:  [phone] & follow prompts after 5pm & weekends Office Phone:  [phone] Office Fax:  [phone]

## 2021-02-02 LAB — HM DIABETES EYE EXAM

## 2021-02-03 ENCOUNTER — Encounter: Payer: Self-pay | Admitting: *Deleted

## 2021-02-07 ENCOUNTER — Encounter: Payer: Self-pay | Admitting: Internal Medicine

## 2021-02-24 ENCOUNTER — Ambulatory Visit (INDEPENDENT_AMBULATORY_CARE_PROVIDER_SITE_OTHER): Payer: Medicare HMO | Admitting: Family

## 2021-02-24 ENCOUNTER — Other Ambulatory Visit: Payer: Self-pay

## 2021-02-24 ENCOUNTER — Encounter: Payer: Self-pay | Admitting: Family

## 2021-02-24 DIAGNOSIS — Z0000 Encounter for general adult medical examination without abnormal findings: Secondary | ICD-10-CM | POA: Diagnosis not present

## 2021-02-24 DIAGNOSIS — Z1159 Encounter for screening for other viral diseases: Secondary | ICD-10-CM | POA: Diagnosis not present

## 2021-02-24 NOTE — Progress Notes (Signed)
    This service is provided via telemedicine  No vital signs collected/recorded due to the encounter was a telemedicine visit.   Location of patient (ex: home, work): Home.   Patient consents to a telephone visit: Yes.  Location of the provider (ex: office, home):  [facility].  Name of any referring provider: Reed, Tiffany L, DO   Names of all persons participating in the telemedicine service and their role in the encounter: Patient, Ledora Delker, RMA, Ngetich, Dinah, NP.    Time spent on call: 8 minutes spent on the phone with Medical Assistant.   

## 2021-02-24 NOTE — Progress Notes (Signed)
Subjective:   Tyler Hull is a 80 y.o. male who presents for Medicare Annual/Subsequent preventive examination.  Review of Systems     Cardiac Risk Factors include: advanced age (>75men, >50 women);male gender;diabetes mellitus;dyslipidemia;hypertension;smoking/ tobacco exposure     Objective:    There were no vitals filed for this visit. There is no height or weight on file to calculate BMI.  Advanced Directives 02/24/2021 01/27/2021 09/23/2020 05/13/2020 02/19/2020 01/12/2020 05/15/2019  Does Patient Have a Medical Advance Directive? Yes Yes Yes Yes No Yes Yes  Type of Estate agent of [place];Living will Healthcare Power of [place];Living will Healthcare Power of [place];Living will Living will;Healthcare Power of [address] - Healthcare Power of [place];Living will Living will  Does patient want to make changes to medical advance directive? No - Patient declined No - Patient declined No - Patient declined No - Patient declined - - No - Patient declined  Copy of Healthcare Power of Attorney in Chart? Yes - validated most recent copy scanned in chart (See row information) Yes - validated most recent copy scanned in chart (See row information) Yes - validated most recent copy scanned in chart (See row information) Yes - validated most recent copy scanned in chart (See row information) - - -  Would patient like information on creating a medical advance directive? - - - - No - Patient declined - -  Pre-existing out of facility DNR order (yellow form or pink MOST form) - - - - - - -    Current Medications (verified) Outpatient Encounter Medications as of 02/24/2021  Medication Sig  . Ascorbic Acid (VITAMIN C) 1000 MG tablet Take 1,000 mg by mouth 2 (two) times daily.  [name] aspirin 81 MG tablet Take 81 mg by mouth daily.  . Aspirin-Salicylamide-Caffeine (BC HEADACHE POWDER PO) Take by mouth as needed.  [name] atorvastatin (LIPITOR) 40 MG tablet TAKE 1 TABLET DAILY TO LOWER  CHOLESTEROL  . latanoprost (XALATAN) 0.005 % ophthalmic solution Place 1 drop into both eyes at bedtime.  [name] lisinopril-hydrochlorothiazide (ZESTORETIC) 20-12.5 MG tablet TAKE 1 TABLET DAILY  . metFORMIN (GLUCOPHAGE) 500 MG tablet TAKE 1 TABLET IN THE MORNING AND TAKE ONE-HALF (1/2) TABLET IN THE EVENING  . Multiple Vitamins-Minerals (MULTIVITAMIN & MINERAL PO) Take 1 tablet by mouth daily.  [name] omeprazole (PRILOSEC) 20 MG capsule Take 1 capsule (20 mg total) by mouth daily before supper.  . pregabalin (LYRICA) 75 MG capsule Take 75 mg by mouth 2 (two) times daily.  . sildenafil (VIAGRA) 50 MG tablet TAKE 1 TABLET AS NEEDED FOR ERECTILE DYSFUNCTION  . vitamin B-12 (CYANOCOBALAMIN) 1000 MCG tablet Take 500 mcg by mouth daily.    No facility-administered encounter medications on file as of 02/24/2021.    Allergies (verified) Penicillins and Shellfish allergy   History: Past Medical History:  Diagnosis Date  . Abdominal pain, other specified site   . Backache, unspecified   . Carpal tunnel syndrome   . Diabetes mellitus without complication (HCC)   . Essential hypertension, benign   . H/O hand surgery 02/25/2020   R hand   . Hyperlipidemia   . Impacted cerumen   . Impaired fasting glucose   . Other abnormal blood chemistry   . Pain in joint, lower leg   . Special screening for malignant neoplasm of prostate   . Unspecified essential hypertension   . Unspecified hypothyroidism    Past Surgical History:  Procedure Laterality Date  . (L) KNEE SURGERY"TORN MENISCUS"    .  EYE SURGERY Left 11/24/2014   cataracts   Family History  Problem Relation Age of Onset  . Alzheimer's disease Sister   . Cancer Brother 60       lung  . Diabetes Sister   . Diabetes Sister   . Lung cancer Sister 77       mets to back  . Kidney disease Sister   . Allergies Sister   . Obesity Sister   . Cancer Mother    Social History   Socioeconomic History  . Marital status: Married    Spouse name: Not  on file  . Number of children: Not on file  . Years of education: Not on file  . Highest education level: Not on file  Occupational History  . Not on file  Tobacco Use  . Smoking status: Former Smoker    Years: 13.00    Types: Cigarettes    Quit date: 01/02/1966    Years since quitting: 55.1  . Smokeless tobacco: Never Used  . Tobacco comment: Quit at age 80   Substance and Sexual Activity  . Alcohol use: No  . Drug use: No  . Sexual activity: Yes  Other Topics Concern  . Not on file  Social History Narrative  . Not on file   Social Determinants of Health   Financial Resource Strain: Not on file  Food Insecurity: Not on file  Transportation Needs: Not on file  Physical Activity: Not on file  Stress: Not on file  Social Connections: Not on file    Tobacco Counseling Counseling given: Not Answered Comment: Quit at age 80    Clinical Intake:  Pre-visit preparation completed: No  Pain : No/denies pain     BMI - recorded: 22.73 Nutritional Status: BMI of 19-24  Normal Nutritional Risks: None Diabetes: No  How often do you need to have someone help you when you read instructions, pamphlets, or other written materials from your doctor or pharmacy?: 1 - Never What is the last grade level you completed in school?: 12 grade  Diabetic?yes   Interpreter Needed?: No  Information entered by :: Kendra Grissett FNP-C   Activities of Daily Living In your present state of health, do you have any difficulty performing the following activities: 02/24/2021  Hearing? N  Vision? N  Difficulty concentrating or making decisions? N  Walking or climbing stairs? N  Dressing or bathing? N  Doing errands, shopping? N  Preparing Food and eating ? N  Comment wife cook  Using the Toilet? N  In the past six months, have you accidently leaked urine? N  Do you have problems with loss of bowel control? N  Managing your Medications? N  Managing your Finances? N  Housekeeping or  managing your Housekeeping? N  Some recent data might be hidden    Patient Care Team: Kermit Baloeed, Tiffany L, DO as PCP - General (Geriatric Medicine) Mateo FlowHecker, Kathryn, MD as Consulting Physician (Ophthalmology) Jeani HawkingHung, Patrick, MD as Consulting Physician (Gastroenterology)  Indicate any recent Medical Services you may have received from other than [facility] providers in the past year (date may be approximate).     Assessment:   This is a routine wellness examination for Tyler Hull.  Hearing/Vision screen  Hearing Screening   125Hz  250Hz  500Hz  1000Hz  2000Hz  3000Hz  4000Hz  6000Hz  8000Hz   Right ear:           Left ear:           Comments: No Hearing Concerns.   Vision Screening  Comments: No Vision Concerns. Patient last eye exam was February 2022, Patient wears prescription glasses.   Dietary issues and exercise activities discussed: Current Exercise Habits: Structured exercise class, Type of exercise: stretching;yoga, Time (Minutes): 45, Frequency (Times/Week): 2, Weekly Exercise (Minutes/Week): 90, Intensity: Moderate, Exercise limited by: None identified  Goals    . Increase water intake     Starting 02/08/17, I will attempt to increase my water intake from 2 bottles per day to 5 bottles per day.       Depression Screen PHQ 2/9 Scores 02/24/2021 09/23/2020 05/13/2020 02/19/2020 01/12/2020 05/15/2019 02/18/2019  PHQ - 2 Score 0 0 0 0 0 0 0    Fall Risk Fall Risk  02/24/2021 01/27/2021 09/23/2020 05/13/2020 02/19/2020  Falls in the past year? 0 0 0 0 1  Number falls in past yr: 0 0 1 0 0  Injury with Fall? 0 0 0 0 0    FALL RISK PREVENTION PERTAINING TO THE HOME:  Any stairs in or around the home? No  If so, are there any without handrails? No  Home free of loose throw rugs in walkways, pet beds, electrical cords, etc? No  Adequate lighting in your home to reduce risk of falls? Yes   ASSISTIVE DEVICES UTILIZED TO PREVENT FALLS:  Life alert? No  Use of a cane, walker or w/c? No  Grab bars in the  bathroom? Yes  Shower chair or bench in shower? No  Elevated toilet seat or a handicapped toilet? Yes   TIMED UP AND GO:  Was the test performed? No .  Length of time to ambulate 10 feet: N/A sec.   Gait steady and fast without use of assistive device  Cognitive Function: MMSE - Mini Mental State Exam 02/18/2019 02/12/2018 02/08/2017 01/03/2016  Orientation to time 5 4 5 5   Orientation to Place 5 5 5 5   Registration 3 3 3 3   Attention/ Calculation 5 5 5 5   Recall 3 2 3 3   Language- name 2 objects 2 2 2 2   Language- repeat 1 1 1 1   Language- follow 3 step command 3 3 3 3   Language- read & follow direction 1 1 1 1   Write a sentence 1 1 1 1   Copy design 1 1 1 1   Total score 30 28 30 30      6CIT Screen 02/24/2021 02/19/2020  What Year? 0 points 0 points  What month? 0 points 0 points  What time? 0 points 0 points  Count back from 20 2 points 0 points  Months in reverse 2 points 0 points  Repeat phrase 0 points 2 points  Total Score 4 2    Immunizations Immunization History  Administered Date(s) Administered  . Fluad Quad(high Dose 65+) 09/08/2019, 09/07/2020  . Influenza, High Dose Seasonal PF 08/30/2017, 09/09/2018  . Influenza,inj,Quad PF,6+ Mos 08/31/2014, 09/30/2015, 08/03/2016  . Influenza,inj,quad, With Preservative 09/17/2018  . Influenza-Unspecified 09/17/2012, 09/17/2013  . PFIZER(Purple Top)SARS-COV-2 Vaccination 01/09/2020, 01/30/2020, 08/24/2020  . Pneumococcal Conjugate-13 11/26/2014, 11/27/2014  . Pneumococcal Polysaccharide-23 12/03/2007  . Td 12/03/2007  . Tdap 12/26/2017  . Zoster 03/15/2013  . Zoster Recombinat (Shingrix) 06/01/2017, 11/14/2017    TDAP status: Up to date  Flu Vaccine status: Up to date  Pneumococcal vaccine status: Up to date  Covid-19 vaccine status: Completed vaccines  Qualifies for Shingles Vaccine? Yes   Zostavax completed Yes   Shingrix Completed?: Yes  Screening Tests Health Maintenance  Topic Date Due  . Hepatitis C  Screening  Never done  . FOOT EXAM  01/11/2021  . COVID-19 Vaccine (4 - Booster for Pfizer series) 02/21/2021  . HEMOGLOBIN A1C  07/24/2021  . OPHTHALMOLOGY EXAM  02/02/2022  . TETANUS/TDAP  12/27/2027  . INFLUENZA VACCINE  Completed  . PNA vac Low Risk Adult  Completed  . HPV VACCINES  Aged Out    Health Maintenance  Health Maintenance Due  Topic Date Due  . Hepatitis C Screening  Never done  . FOOT EXAM  01/11/2021  . COVID-19 Vaccine (4 - Booster for Pfizer series) 02/21/2021    Colorectal cancer screening: No longer required.   Lung Cancer Screening: (Low Dose CT Chest recommended if Age 24-80 years, 30 pack-year currently smoking OR have quit w/in 15years.) does not qualify.   Lung Cancer Screening Referral: No   Additional Screening:  Hepatitis C Screening: does qualify; Completed no ordered this visit.   Vision Screening: Recommended annual ophthalmology exams for early detection of glaucoma and other disorders of the eye. Is the patient up to date with their annual eye exam?  Yes  Who is the provider or what is the name of the office in which the patient attends annual eye exams? Dr. Alben Spittle  If pt is not established with a provider, would they like to be referred to a provider to establish care? No .   Dental Screening: Recommended annual dental exams for proper oral hygiene  Community Resource Referral / Chronic Care Management: CRR required this visit?  No   CCM required this visit?  No      Plan:   - Hep C screening  - Annual Foot Exam due decline referral to Podiatrist would like feet exam to be done by PCP.  I have personally reviewed and noted the following in the patient's chart:   . Medical and social history . Use of alcohol, tobacco or illicit drugs  . Current medications and supplements . Functional ability and status . Nutritional status . Physical activity . Advanced directives . List of other physicians . Hospitalizations, surgeries, and  ER visits in previous 12 months . Vitals . Screenings to include cognitive, depression, and falls . Referrals and appointments  In addition, I have reviewed and discussed with patient certain preventive protocols, quality metrics, and best practice recommendations. A written personalized care plan for preventive services as well as general preventive health recommendations were provided to patient.    Caesar Bookman, NP   02/24/2021   Nurse Notes: - Annual Foot Exam due decline referral to Podiatrist would like feet exam to be done by PCP.

## 2021-02-24 NOTE — Patient Instructions (Signed)
[name] , Thank you for taking time to come for your Medicare Wellness Visit. I appreciate your ongoing commitment to your health goals. Please review the following plan we discussed and let me know if I can assist you in the future.   Screening recommendations/referrals: Colonoscopy: N/A  Recommended yearly ophthalmology/optometry visit for glaucoma screening and checkup Recommended yearly dental visit for hygiene and checkup  Vaccinations: Influenza vaccine: Up to date  Pneumococcal vaccine : Up to date  Tdap vaccine : Up to date  Shingles vaccine : Up to date   Advanced directives: Yes   Conditions/risks identified: Advance age male > 62 yrs,Male Gender,type 2 Diabetes Mellitus,Hypertension,dyslipidemia,Hx of smoking   Next appointment: 1 year   Preventive Care 38 Years and Older, Male Preventive care refers to lifestyle choices and visits with your health care provider that can promote health and wellness. What does preventive care include?  A yearly physical exam. This is also called an annual well check.  Dental exams once or twice a year.  Routine eye exams. Ask your health care provider how often you should have your eyes checked.  Personal lifestyle choices, including:  Daily care of your teeth and gums.  Regular physical activity.  Eating a healthy diet.  Avoiding tobacco and drug use.  Limiting alcohol use.  Practicing safe sex.  Taking low doses of aspirin every day.  Taking vitamin and mineral supplements as recommended by your health care provider. What happens during an annual well check? The services and screenings done by your health care provider during your annual well check will depend on your age, overall health, lifestyle risk factors, and family history of disease. Counseling  Your health care provider may ask you questions about your:  Alcohol use.  Tobacco use.  Drug use.  Emotional well-being.  Home and relationship  well-being.  Sexual activity.  Eating habits.  History of falls.  Memory and ability to understand (cognition).  Work and work Astronomer. Screening  You may have the following tests or measurements:  Height, weight, and BMI.  Blood pressure.  Lipid and cholesterol levels. These may be checked every 5 years, or more frequently if you are over 78 years old.  Skin check.  Lung cancer screening. You may have this screening every year starting at age 53 if you have a 30-pack-year history of smoking and currently smoke or have quit within the past 15 years.  Fecal occult blood test (FOBT) of the stool. You may have this test every year starting at age 24.  Flexible sigmoidoscopy or colonoscopy. You may have a sigmoidoscopy every 5 years or a colonoscopy every 10 years starting at age 21.  Prostate cancer screening. Recommendations will vary depending on your family history and other risks.  Hepatitis C blood test.  Hepatitis B blood test.  Sexually transmitted disease (STD) testing.  Diabetes screening. This is done by checking your blood sugar (glucose) after you have not eaten for a while (fasting). You may have this done every 1-3 years.  Abdominal aortic aneurysm (AAA) screening. You may need this if you are a current or former smoker.  Osteoporosis. You may be screened starting at age 32 if you are at high risk. Talk with your health care provider about your test results, treatment options, and if necessary, the need for more tests. Vaccines  Your health care provider may recommend certain vaccines, such as:  Influenza vaccine. This is recommended every year.  Tetanus, diphtheria, and acellular pertussis (Tdap,  Td) vaccine. You may need a Td booster every 10 years.  Zoster vaccine. You may need this after age 61.  Pneumococcal 13-valent conjugate (PCV13) vaccine. One dose is recommended after age 74.  Pneumococcal polysaccharide (PPSV23) vaccine. One dose is  recommended after age 40. Talk to your health care provider about which screenings and vaccines you need and how often you need them. This information is not intended to replace advice given to you by your health care provider. Make sure you discuss any questions you have with your health care provider. Document Released: 12/31/2015 Document Revised: 08/23/2016 Document Reviewed: 10/05/2015 Elsevier Interactive Patient Education  2017 [place] Prevention in the Home Falls can cause injuries. They can happen to people of all ages. There are many things you can do to make your home safe and to help prevent falls. What can I do on the outside of my home?  Regularly fix the edges of walkways and driveways and fix any cracks.  Remove anything that might make you trip as you walk through a door, such as a raised step or threshold.  Trim any bushes or trees on the path to your home.  Use bright outdoor lighting.  Clear any walking paths of anything that might make someone trip, such as rocks or tools.  Regularly check to see if handrails are loose or broken. Make sure that both sides of any steps have handrails.  Any raised decks and porches should have guardrails on the edges.  Have any leaves, snow, or ice cleared regularly.  Use sand or salt on walking paths during winter.  Clean up any spills in your garage right away. This includes oil or grease spills. What can I do in the bathroom?  Use night lights.  Install grab bars by the toilet and in the tub and shower. Do not use towel bars as grab bars.  Use non-skid mats or decals in the tub or shower.  If you need to sit down in the shower, use a plastic, non-slip stool.  Keep the floor dry. Clean up any water that spills on the floor as soon as it happens.  Remove soap buildup in the tub or shower regularly.  Attach bath mats securely with double-sided non-slip rug tape.  Do not have throw rugs and other things on  the floor that can make you trip. What can I do in the bedroom?  Use night lights.  Make sure that you have a light by your bed that is easy to reach.  Do not use any sheets or blankets that are too big for your bed. They should not hang down onto the floor.  Have a firm chair that has side arms. You can use this for support while you get dressed.  Do not have throw rugs and other things on the floor that can make you trip. What can I do in the kitchen?  Clean up any spills right away.  Avoid walking on wet floors.  Keep items that you use a lot in easy-to-reach places.  If you need to reach something above you, use a strong step stool that has a grab bar.  Keep electrical cords out of the way.  Do not use floor polish or wax that makes floors slippery. If you must use wax, use non-skid floor wax.  Do not have throw rugs and other things on the floor that can make you trip. What can I do with my stairs?  Do not  leave any items on the stairs.  Make sure that there are handrails on both sides of the stairs and use them. Fix handrails that are broken or loose. Make sure that handrails are as long as the stairways.  Check any carpeting to make sure that it is firmly attached to the stairs. Fix any carpet that is loose or worn.  Avoid having throw rugs at the top or bottom of the stairs. If you do have throw rugs, attach them to the floor with carpet tape.  Make sure that you have a light switch at the top of the stairs and the bottom of the stairs. If you do not have them, ask someone to add them for you. What else can I do to help prevent falls?  Wear shoes that:  Do not have high heels.  Have rubber bottoms.  Are comfortable and fit you well.  Are closed at the toe. Do not wear sandals.  If you use a stepladder:  Make sure that it is fully opened. Do not climb a closed stepladder.  Make sure that both sides of the stepladder are locked into place.  Ask someone to  hold it for you, if possible.  Clearly mark and make sure that you can see:  Any grab bars or handrails.  First and last steps.  Where the edge of each step is.  Use tools that help you move around (mobility aids) if they are needed. These include:  Canes.  Walkers.  Scooters.  Crutches.  Turn on the lights when you go into a dark area. Replace any light bulbs as soon as they burn out.  Set up your furniture so you have a clear path. Avoid moving your furniture around.  If any of your floors are uneven, fix them.  If there are any pets around you, be aware of where they are.  Review your medicines with your doctor. Some medicines can make you feel dizzy. This can increase your chance of falling. Ask your doctor what other things that you can do to help prevent falls. This information is not intended to replace advice given to you by your health care provider. Make sure you discuss any questions you have with your health care provider. Document Released: 09/30/2009 Document Revised: 05/11/2016 Document Reviewed: 01/08/2015 Elsevier Interactive Patient Education  2017 [employer].

## 2021-05-11 ENCOUNTER — Other Ambulatory Visit: Payer: Self-pay | Admitting: *Deleted

## 2021-05-11 MED ORDER — ATORVASTATIN CALCIUM 40 MG PO TABS: ORAL_TABLET | ORAL | 3 refills | Status: DC

## 2021-05-11 NOTE — Telephone Encounter (Signed)
[facility] Requested refill.  °

## 2021-06-01 ENCOUNTER — Other Ambulatory Visit: Payer: Self-pay

## 2021-06-01 DIAGNOSIS — Z1159 Encounter for screening for other viral diseases: Secondary | ICD-10-CM

## 2021-06-01 DIAGNOSIS — E785 Hyperlipidemia, unspecified: Secondary | ICD-10-CM

## 2021-06-01 DIAGNOSIS — I10 Essential (primary) hypertension: Secondary | ICD-10-CM

## 2021-06-01 DIAGNOSIS — E039 Hypothyroidism, unspecified: Secondary | ICD-10-CM

## 2021-06-01 DIAGNOSIS — E1142 Type 2 diabetes mellitus with diabetic polyneuropathy: Secondary | ICD-10-CM

## 2021-06-02 ENCOUNTER — Other Ambulatory Visit: Payer: Self-pay

## 2021-06-02 ENCOUNTER — Other Ambulatory Visit: Payer: Medicare HMO

## 2021-06-02 DIAGNOSIS — Z1159 Encounter for screening for other viral diseases: Secondary | ICD-10-CM

## 2021-06-02 DIAGNOSIS — E039 Hypothyroidism, unspecified: Secondary | ICD-10-CM

## 2021-06-02 DIAGNOSIS — E1142 Type 2 diabetes mellitus with diabetic polyneuropathy: Secondary | ICD-10-CM

## 2021-06-02 DIAGNOSIS — E1169 Type 2 diabetes mellitus with other specified complication: Secondary | ICD-10-CM

## 2021-06-02 DIAGNOSIS — I10 Essential (primary) hypertension: Secondary | ICD-10-CM

## 2021-06-03 LAB — HEPATITIS C ANTIBODY
Hepatitis C Ab: NONREACTIVE
SIGNAL TO CUT-OFF: 0.01 (ref <1.00)

## 2021-06-03 LAB — COMPLETE METABOLIC PANEL WITH GFR
AG Ratio: 1.8 (calc) (ref 1.0–2.5)
ALT: 20 U/L (ref 9–46)
AST: 20 U/L (ref 10–35)
Albumin: 4.7 g/dL (ref 3.6–5.1)
Alkaline phosphatase (APISO): 73 U/L (ref 35–144)
BUN: 15 mg/dL (ref 7–25)
CO2: 27 mmol/L (ref 20–32)
Calcium: 10.1 mg/dL (ref 8.6–10.3)
Chloride: 101 mmol/L (ref 98–110)
Creat: 0.97 mg/dL (ref 0.70–1.18)
GFR, Est African American: 86 mL/min/{1.73_m2} (ref >=60)
GFR, Est Non African American: 74 mL/min/{1.73_m2} (ref >=60)
Globulin: 2.6 g/dL (calc) (ref 1.9–3.7)
Glucose, Bld: 88 mg/dL (ref 65–99)
Potassium: 4.1 mmol/L (ref 3.5–5.3)
Sodium: 137 mmol/L (ref 135–146)
Total Bilirubin: 1 mg/dL (ref 0.2–1.2)
Total Protein: 7.3 g/dL (ref 6.1–8.1)

## 2021-06-03 LAB — HEMOGLOBIN A1C
Hgb A1c MFr Bld: 5.6 % of total Hgb (ref <5.7)
Mean Plasma Glucose: 114 mg/dL
eAG (mmol/L): 6.3 mmol/L

## 2021-06-03 LAB — TSH: TSH: 2.5 mIU/L (ref 0.40–4.50)

## 2021-06-07 ENCOUNTER — Ambulatory Visit: Payer: Medicare HMO | Admitting: Orthopedic Surgery

## 2021-06-09 ENCOUNTER — Ambulatory Visit (INDEPENDENT_AMBULATORY_CARE_PROVIDER_SITE_OTHER): Payer: Medicare HMO | Admitting: Orthopedic Surgery

## 2021-06-09 ENCOUNTER — Encounter: Payer: Self-pay | Admitting: Orthopedic Surgery

## 2021-06-09 ENCOUNTER — Other Ambulatory Visit: Payer: Self-pay

## 2021-06-09 VITALS — BP 138/78 | HR 69 | Temp 97.1°F | Wt 138.0 lb

## 2021-06-09 DIAGNOSIS — H6123 Impacted cerumen, bilateral: Secondary | ICD-10-CM | POA: Diagnosis not present

## 2021-06-09 DIAGNOSIS — E1169 Type 2 diabetes mellitus with other specified complication: Secondary | ICD-10-CM

## 2021-06-09 DIAGNOSIS — E785 Hyperlipidemia, unspecified: Secondary | ICD-10-CM

## 2021-06-09 DIAGNOSIS — M25562 Pain in left knee: Secondary | ICD-10-CM

## 2021-06-09 DIAGNOSIS — I10 Essential (primary) hypertension: Secondary | ICD-10-CM

## 2021-06-09 DIAGNOSIS — E1142 Type 2 diabetes mellitus with diabetic polyneuropathy: Secondary | ICD-10-CM | POA: Diagnosis not present

## 2021-06-09 DIAGNOSIS — G8929 Other chronic pain: Secondary | ICD-10-CM

## 2021-06-09 DIAGNOSIS — M25522 Pain in left elbow: Secondary | ICD-10-CM

## 2021-06-09 DIAGNOSIS — E039 Hypothyroidism, unspecified: Secondary | ICD-10-CM

## 2021-06-09 DIAGNOSIS — K219 Gastro-esophageal reflux disease without esophagitis: Secondary | ICD-10-CM

## 2021-06-09 NOTE — Patient Instructions (Signed)
Debrox- 5 gtts every night for one week, then shower with warm water and flush ears after Debrox complete

## 2021-06-09 NOTE — Progress Notes (Signed)
Careteam: Patient Care Team: Tyler Heir, NP as PCP - General (Adult Health Nurse Practitioner) [name], MD as Consulting Physician (Ophthalmology) [name], MD as Consulting Physician (Gastroenterology)  Seen by: [name], AGNP-C  PLACE OF SERVICE:  [facility]  Advanced Directive information    Allergies  Allergen Reactions   Penicillins Swelling   Shellfish Allergy Swelling    Chief Complaint  Patient presents with   Medical Management of Chronic Issues    Patient presents today for 4 month follow-up for DM,HTN, GERD and HLD.     HPI: Patient is a 80 y.o. male seen today for medical management of chronic conditions.   Labs reviewed with patient.   Eats salad daily. Admits to drinking Pepsi, 2 times a month.   Exercising at the [employer] twice a week. Part of [employer] sne[employer] program.   Left arm tremor or stiffness. Believes it is stemming from his neck. Willing to try sleeping on new pillow.   Left knee pain has increased. Going to see Dr. Ranell Patrick, for injections. No recent falls or injuries. Plans to schedule appointment soon. Taking prn alleve for pain.   Foot exam today. Monofilament 10/10.  Heartburn stable with Prilosec.   Urinates once a night, denies urinary issues.   Eye exam with Dr. Alben Spittle in August.   Up to date on covid vaccine second booster.    Review of Systems:  Review of Systems  Constitutional:  Negative for chills, fever, malaise/fatigue and weight loss.  HENT:  Positive for hearing loss. Negative for congestion.   Eyes:  Negative for blurred vision.       Glasses  Respiratory:  Negative for cough, shortness of breath and wheezing.   Cardiovascular:  Negative for chest pain, palpitations and leg swelling.  Gastrointestinal:  Negative for abdominal pain, constipation, diarrhea, heartburn, nausea and vomiting.  Genitourinary:  Negative for dysuria, frequency and hematuria.  Musculoskeletal:  Positive for joint pain,  myalgias and neck pain. Negative for falls.  Skin: Negative.   Neurological:  Negative for dizziness, weakness and headaches.  Psychiatric/Behavioral:  Negative for depression. The patient is not nervous/anxious and does not have insomnia.    Past Medical History:  Diagnosis Date   Abdominal pain, other specified site    Backache, unspecified    Carpal tunnel syndrome    Diabetes mellitus without complication (HCC)    Essential hypertension, benign    H/O hand surgery 02/25/2020   R hand    Hyperlipidemia    Impacted cerumen    Impaired fasting glucose    Other abnormal blood chemistry    Pain in joint, lower leg    Special screening for malignant neoplasm of prostate    Unspecified essential hypertension    Unspecified hypothyroidism    Past Surgical History:  Procedure Laterality Date   (L) KNEE SURGERY"TORN MENISCUS"     EYE SURGERY Left 11/24/2014   cataracts   Social History:   reports that he quit smoking about 55 years ago. His smoking use included cigarettes. He has never used smokeless tobacco. He reports that he does not drink alcohol and does not use drugs.  Family History  Problem Relation Age of Onset   Alzheimer's disease Sister    Cancer Brother 81       lung   Diabetes Sister    Diabetes Sister    Lung cancer Sister 22       mets to back   Kidney disease Sister  Allergies Sister    Obesity Sister    Cancer Mother     Medications: Patient's Medications  New Prescriptions   No medications on file  Previous Medications   ASCORBIC ACID (VITAMIN C) 1000 MG TABLET    Take 1,000 mg by mouth 2 (two) times daily.   ASPIRIN 81 MG TABLET    Take 81 mg by mouth daily.   ASPIRIN-SALICYLAMIDE-CAFFEINE (BC HEADACHE POWDER PO)    Take by mouth as needed.   ATORVASTATIN (LIPITOR) 40 MG TABLET    Take one tablet by mouth once daily to lower cholesterol.   LATANOPROST (XALATAN) 0.005 % OPHTHALMIC SOLUTION    Place 1 drop into both eyes at bedtime.    LISINOPRIL-HYDROCHLOROTHIAZIDE (ZESTORETIC) 20-12.5 MG TABLET    TAKE 1 TABLET DAILY   METFORMIN (GLUCOPHAGE) 500 MG TABLET    TAKE 1 TABLET IN THE MORNING AND TAKE ONE-HALF (1/2) TABLET IN THE EVENING   MULTIPLE VITAMINS-MINERALS (MULTIVITAMIN & MINERAL PO)    Take 1 tablet by mouth daily.   OMEPRAZOLE (PRILOSEC) 20 MG CAPSULE    Take 1 capsule (20 mg total) by mouth daily before supper.   PREGABALIN (LYRICA) 75 MG CAPSULE    Take 75 mg by mouth 2 (two) times daily.   SILDENAFIL (VIAGRA) 50 MG TABLET    TAKE 1 TABLET AS NEEDED FOR ERECTILE DYSFUNCTION   VITAMIN B-12 (CYANOCOBALAMIN) 1000 MCG TABLET    Take 500 mcg by mouth daily.   Modified Medications   No medications on file  Discontinued Medications   No medications on file    Physical Exam:  Vitals:   06/09/21 1112  BP: 138/78  Pulse: 69  Temp: (!) 97.1 F (36.2 C)  SpO2: 98%  Weight: 138 lb (62.6 kg)   Body mass index is 22.27 kg/m. Wt Readings from Last 3 Encounters:  06/09/21 138 lb (62.6 kg)  01/27/21 140 lb 12.8 oz (63.9 kg)  09/23/20 141 lb 1.6 oz (64 kg)    Physical Exam Vitals reviewed.  Constitutional:      General: He is not in acute distress. HENT:     Head: Normocephalic.     Right Ear: There is no impacted cerumen.     Left Ear: There is no impacted cerumen.     Nose: Nose normal.     Mouth/Throat:     Mouth: Mucous membranes are moist.  Eyes:     General:        Right eye: No discharge.        Left eye: No discharge.  Neck:     Thyroid: No thyroid mass or thyromegaly.     Vascular: No carotid bruit.  Cardiovascular:     Rate and Rhythm: Normal rate and regular rhythm.     Pulses: Normal pulses.     Heart sounds: Normal heart sounds. No murmur heard. Pulmonary:     Effort: Pulmonary effort is normal. No respiratory distress.     Breath sounds: Normal breath sounds. No wheezing.  Abdominal:     General: Bowel sounds are normal. There is no distension.     Palpations: Abdomen is soft.      Tenderness: There is no abdominal tenderness.  Musculoskeletal:     Left upper arm: Swelling and tenderness present. No deformity.     Cervical back: Normal range of motion.     Left knee: No swelling, erythema or crepitus. Normal range of motion. No tenderness.     Right lower leg:  No edema.     Left lower leg: No edema.     Comments: Left elbow with some mild swelling and tenderness. No deformity or injury. Full range of motion.    Feet:     Right foot:     Protective Sensation: 10 sites tested.  10 sites sensed.     Skin integrity: Skin integrity normal.     Toenail Condition: Right toenails are normal.     Left foot:     Protective Sensation: 10 sites tested.  10 sites sensed.     Skin integrity: Skin integrity normal.     Toenail Condition: Left toenails are normal.  Lymphadenopathy:     Cervical: No cervical adenopathy.  Skin:    General: Skin is warm and dry.     Capillary Refill: Capillary refill takes less than 2 seconds.  Neurological:     General: No focal deficit present.     Mental Status: He is alert and oriented to person, place, and time.  Psychiatric:        Mood and Affect: Mood normal.        Behavior: Behavior normal.    Labs reviewed: Basic Metabolic Panel: Recent Labs    09/20/20 0829 01/24/21 0859 06/02/21 0908  NA 140 141 137  K 4.1 4.2 4.1  CL 103 103 101  CO2 30 31 27   GLUCOSE 95 91 88  BUN 11 15 15   CREATININE 1.00 0.94 0.97  CALCIUM 9.3 9.6 10.1  TSH  --  2.83 2.50   Liver Function Tests: Recent Labs    01/24/21 0859 06/02/21 0908  AST 18 20  ALT 17 20  BILITOT 0.5 1.0  PROT 7.0 7.3   No results for input(s): LIPASE, AMYLASE in the last 8760 hours. No results for input(s): AMMONIA in the last 8760 hours. CBC: Recent Labs    09/20/20 0829 01/24/21 0859  WBC 8.7 9.8  NEUTROABS 5,324 6,115  HGB 14.3 15.5  HCT 43.2 45.7  MCV 91.9 90.0  PLT 273 314   Lipid Panel: Recent Labs    09/20/20 0829 01/24/21 0859  CHOL 142 125   HDL 48 47  LDLCALC 75 62  TRIG 107 82  CHOLHDL 3.0 2.7   TSH: Recent Labs    01/24/21 0859 06/02/21 0908  TSH 2.83 2.50   A1C: Lab Results  Component Value Date   HGBA1C 5.6 06/02/2021     Assessment/Plan 1. Bilateral impacted cerumen - wax buildup in bilateral canals - debrox 5 gtts qhs - apply to both ears x 7 days, then flush with warm water - if not improvement contact PCP for ear lavage  2. Hyperlipidemia associated with type 2 diabetes mellitus (HCC) - LDL 62 02/072022- at goal < 70 - cont lipitor 40 mg daily - Hepatic Function Panel; Future  3. Essential hypertension, benign - controlled - cont HCTZ-lisinopril 20-12.5 mg daily - CBC with Differential/Platelet; Future - Basic Metabolic Panel; Future  4. Controlled type 2 diabetes mellitus with diabetic polyneuropathy, without long-term current use of insulin (HCC) - A1c 5.6 06/02/2021 - monofilament complete- sensation 10/10 both feet - cont metformin 500 mg in am and 1/2 tablet in pm - Hemoglobin A1c; Future  5. Hypothyroidism, unspecified type - TSH 2.50 06/02/2021  6. Chronic pain of left knee - followed by Dr. 12/10, past meniscus repair - no effusion, erythema or cerpitus - plans to see for injections soon - cont lyrica 75 mg daily for pain  7. Left  elbow pain - some swelling and tenderness noted - cont lyrica 75 mf daily - recommend tylenol or alleve prn for pain - recommend applying ice to elbow tid x 5 days for swelling  8. Gastroesophageal reflux disease without esophagitis.  - stable with prilosec daily  Total time: 31 minutes. Greater than 50% of total time spent doing patient education on diabetic diet and symptoms management regarding orthopedic issues.   Next appt: Visit date not found  Raelle Chambers Scherry Ran  [facility] [phone]

## 2021-08-16 ENCOUNTER — Telehealth (INDEPENDENT_AMBULATORY_CARE_PROVIDER_SITE_OTHER): Payer: Medicare HMO | Admitting: Nurse Practitioner

## 2021-08-16 ENCOUNTER — Telehealth: Payer: Self-pay

## 2021-08-16 ENCOUNTER — Other Ambulatory Visit: Payer: Self-pay

## 2021-08-16 DIAGNOSIS — U071 COVID-19: Secondary | ICD-10-CM | POA: Diagnosis not present

## 2021-08-16 NOTE — Telephone Encounter (Signed)
[name], Tyler Hull are scheduled for a virtual visit with your provider today.    Just as we do with appointments in the office, we must obtain your consent to participate.  Your consent will be active for this visit and any virtual visit you may have with one of our providers in the next 365 days.    If you have a MyChart account, I can also send a copy of this consent to you electronically.  All virtual visits are billed to your insurance company just like a traditional visit in the office.  As this is a virtual visit, video technology does not allow for your provider to perform a traditional examination.  This may limit your provider's ability to fully assess your condition.  If your provider identifies any concerns that need to be evaluated in person or the need to arrange testing such as labs, EKG, etc, we will make arrangements to do so.    Although advances in technology are sophisticated, we cannot ensure that it will always work on either your end or our end.  If the connection with a video visit is poor, we may have to switch to a telephone visit.  With either a video or telephone visit, we are not always able to ensure that we have a secure connection.   I need to obtain your verbal consent now.   Are you willing to proceed with your visit today?   Tyler Hull has provided verbal consent on 08/16/2021 for a virtual visit (video or telephone).   Elveria Royals, CMA 08/16/2021  10:31 AM

## 2021-08-16 NOTE — Progress Notes (Signed)
This service is provided via telemedicine  No vital signs collected/recorded due to the encounter was a telemedicine visit.   Location of patient (ex: home, work):  Home  Patient consents to a telephone visit:  Yes, see encounter dated 08/16/2021  Location of the provider (ex: office, home):  [employer]  Name of any referring provider:  [name], NP  Names of all persons participating in the telemedicine service and their role in the encounter:  Abbey Chatters, Nurse Practitioner, Elveria Royals, CMA, and patient.   Time spent on call:  10 minutes with medical assistant

## 2021-08-16 NOTE — Telephone Encounter (Signed)
This encounter was created in error - please disregard.

## 2021-08-16 NOTE — Progress Notes (Signed)
Careteam: Patient Care Team: Octavia Heir, NP as PCP - General (Adult Health Nurse Practitioner) [name], MD as Consulting Physician (Ophthalmology) [name], MD as Consulting Physician (Gastroenterology)  Advanced Directive information    Allergies  Allergen Reactions   Penicillins Swelling   Shellfish Allergy Swelling    Chief Complaint  Patient presents with   Acute Visit    Patient tested positive for COVID on Sunday. Patient would like to know about antiviral medication. Patient having dry cough, runny nose, fever. No diarrhea. Known COVID exposure on Thursday 08/11/2021.Symptoms started Saturday.     HPI: Patient is a 80 y.o. male positive for COVID. Having hot and cold flashes.  Coughing and clammy. Feels better today vs yesterday.  Went through a whole box of tissues.  Had a lot of congestion in his head and chest but getting better.  Last night temperature was 97. 98.2 today. Taking tylenol.  He does have a cough.  No shortness of breath or chest pain.   He was supposed to go to [place] for his granddaughters graduation but now has covid and needs a note since he is unable to travel.   Review of Systems:  Review of Systems  Constitutional:  Positive for chills and malaise/fatigue.  HENT:  Positive for congestion. Negative for sinus pain and sore throat.   Respiratory:  Positive for cough. Negative for shortness of breath.   Cardiovascular:  Negative for chest pain.  Neurological:  Negative for headaches.   Past Medical History:  Diagnosis Date   Abdominal pain, other specified site    Backache, unspecified    Carpal tunnel syndrome    Diabetes mellitus without complication (HCC)    Essential hypertension, benign    H/O hand surgery 02/25/2020   R hand    Hyperlipidemia    Impacted cerumen    Impaired fasting glucose    Other abnormal blood chemistry    Pain in joint, lower leg    Special screening for malignant neoplasm of prostate     Unspecified essential hypertension    Unspecified hypothyroidism    Past Surgical History:  Procedure Laterality Date   (L) KNEE SURGERY"TORN MENISCUS"     EYE SURGERY Left 11/24/2014   cataracts   Social History:   reports that he quit smoking about 55 years ago. His smoking use included cigarettes. He has never used smokeless tobacco. He reports that he does not drink alcohol and does not use drugs.  Family History  Problem Relation Age of Onset   Alzheimer's disease Sister    Cancer Brother 40       lung   Diabetes Sister    Diabetes Sister    Lung cancer Sister 62       mets to back   Kidney disease Sister    Allergies Sister    Obesity Sister    Cancer Mother     Medications: Patient's Medications  New Prescriptions   No medications on file  Previous Medications   ASCORBIC ACID (VITAMIN C) 1000 MG TABLET    Take 1,000 mg by mouth 2 (two) times daily.   ASPIRIN 81 MG TABLET    Take 81 mg by mouth daily.   ASPIRIN-SALICYLAMIDE-CAFFEINE (BC HEADACHE POWDER PO)    Take by mouth as needed.   ATORVASTATIN (LIPITOR) 40 MG TABLET    Take one tablet by mouth once daily to lower cholesterol.   LATANOPROST (XALATAN) 0.005 % OPHTHALMIC SOLUTION    Place  1 drop into both eyes at bedtime.   LISINOPRIL-HYDROCHLOROTHIAZIDE (ZESTORETIC) 20-12.5 MG TABLET    TAKE 1 TABLET DAILY   METFORMIN (GLUCOPHAGE) 500 MG TABLET    TAKE 1 TABLET IN THE MORNING AND TAKE ONE-HALF (1/2) TABLET IN THE EVENING   MULTIPLE VITAMINS-MINERALS (MULTIVITAMIN & MINERAL PO)    Take 1 tablet by mouth daily.   PREGABALIN (LYRICA) 75 MG CAPSULE    Take 75 mg by mouth 2 (two) times daily.   SILDENAFIL (VIAGRA) 50 MG TABLET    TAKE 1 TABLET AS NEEDED FOR ERECTILE DYSFUNCTION   VITAMIN B-12 (CYANOCOBALAMIN) 1000 MCG TABLET    Take 500 mcg by mouth daily.   Modified Medications   No medications on file  Discontinued Medications   OMEPRAZOLE (PRILOSEC) 20 MG CAPSULE    Take 1 capsule (20 mg total) by mouth daily  before supper.    Physical Exam:  There were no vitals filed for this visit. There is no height or weight on file to calculate BMI. Wt Readings from Last 3 Encounters:  06/09/21 138 lb (62.6 kg)  01/27/21 140 lb 12.8 oz (63.9 kg)  09/23/20 141 lb 1.6 oz (64 kg)      Labs reviewed: Basic Metabolic Panel: Recent Labs    09/20/20 0829 01/24/21 0859 06/02/21 0908  NA 140 141 137  K 4.1 4.2 4.1  CL 103 103 101  CO2 30 31 27   GLUCOSE 95 91 88  BUN 11 15 15   CREATININE 1.00 0.94 0.97  CALCIUM 9.3 9.6 10.1  TSH  --  2.83 2.50   Liver Function Tests: Recent Labs    01/24/21 0859 06/02/21 0908  AST 18 20  ALT 17 20  BILITOT 0.5 1.0  PROT 7.0 7.3   No results for input(s): LIPASE, AMYLASE in the last 8760 hours. No results for input(s): AMMONIA in the last 8760 hours. CBC: Recent Labs    09/20/20 0829 01/24/21 0859  WBC 8.7 9.8  NEUTROABS 5,324 6,115  HGB 14.3 15.5  HCT 43.2 45.7  MCV 91.9 90.0  PLT 273 314   Lipid Panel: Recent Labs    09/20/20 0829 01/24/21 0859  CHOL 142 125  HDL 48 47  LDLCALC 75 62  TRIG 107 82  CHOLHDL 3.0 2.7   TSH: Recent Labs    01/24/21 0859 06/02/21 0908  TSH 2.83 2.50   A1C: Lab Results  Component Value Date   HGBA1C 5.6 06/02/2021     Assessment/Plan 1. COVID-19 --recommended to take Vit C 1000 mg twice daily, Vit D 5000 units daily and zinc 50 mg daily for 7 days -maintain proper hydration -tylenol 650 mg by mouth every 8 hours as needed fever/body aches.  -mucinex DM twice daily as needed for cough and chest congestion -do not sit in bed all day, sit up in chair and walk around as tolerated -nasal wash daily and nasal saline PRN.  -education provided on when to follow up and when to seek immediate medication attention through the ED.  - discussed isolation guidelines with pt    Lavontay Kirk K. 06/04/21  [facility] [phone]    Virtual Visit via [facility]   I connected  with patient on 08/16/21 at 10:30 AM EDT by video and verified that I am speaking with the correct person using two identifiers.  Location: Patient: home Provider: [place]    I discussed the limitations, risks, security and privacy concerns of performing an evaluation and  management service by telephone and the availability of in person appointments. I also discussed with the patient that there may be a patient responsible charge related to this service. The patient expressed understanding and agreed to proceed.   I discussed the assessment and treatment plan with the patient. The patient was provided an opportunity to ask questions and all were answered. The patient agreed with the plan and demonstrated an understanding of the instructions.   The patient was advised to call back or seek an in-person evaluation if the symptoms worsen or if the condition fails to improve as anticipated.  I provided 18 minutes of non-face-to-face time during this encounter.  Janene Harvey. [employer] printed and mailed

## 2021-08-17 ENCOUNTER — Telehealth: Payer: Self-pay | Admitting: *Deleted

## 2021-08-17 NOTE — Telephone Encounter (Signed)
He still needs to quarantine for 5 days after his positive test, then to wear a mask 5 days after that when out.

## 2021-08-17 NOTE — Telephone Encounter (Signed)
Patient called and stated that he was told to call today to let you know how he was doing:  Stated that he woke up and felt better, 98.9 temp. Nose not running. Feeling better.    Covid Thursday from church member. Sunday tested Positive. How long does he need to quarantine for? Wants to know if he should go by Thursday or Sunday.   Please Advise.

## 2021-08-17 NOTE — Telephone Encounter (Signed)
Patient notified and agreed.  

## 2021-11-07 ENCOUNTER — Encounter: Payer: Self-pay | Admitting: Family Medicine

## 2021-11-07 ENCOUNTER — Other Ambulatory Visit: Payer: Self-pay

## 2021-11-07 ENCOUNTER — Ambulatory Visit (INDEPENDENT_AMBULATORY_CARE_PROVIDER_SITE_OTHER): Payer: Medicare HMO | Admitting: Family Medicine

## 2021-11-07 VITALS — BP 134/76 | HR 60 | Temp 96.5°F | Ht 66.0 in | Wt 140.8 lb

## 2021-11-07 DIAGNOSIS — R0789 Other chest pain: Secondary | ICD-10-CM

## 2021-11-07 MED ORDER — LIDOCAINE 5 % EX PTCH: 1.0000 | MEDICATED_PATCH | CUTANEOUS | 0 refills | Status: DC

## 2021-11-07 NOTE — Progress Notes (Signed)
Provider:  Jacalyn Lefevre, MD  Careteam: Patient Care Team: Octavia Heir, NP as PCP - General (Adult Health Nurse Practitioner) [name], MD as Consulting Physician (Ophthalmology) [name], MD as Consulting Physician (Gastroenterology)  PLACE OF SERVICE:  [facility]  Advanced Directive information    Allergies  Allergen Reactions   Penicillins Swelling   Shellfish Allergy Swelling    Chief Complaint  Patient presents with   Acute Visit    Patient presents today for skin check upper left chest. He reports a knot and painful.     HPI: Patient is a 80 y.o. male patient presents for evaluation of discoloration on left upper anterior chest.  It is tender to touch.  It has been present for about 3 weeks.  He denies any trauma to the area.  There is no shortness of breath or chest pain just localized tenderness of on the skin.  Review of Systems:  Review of Systems  Respiratory:  Negative for cough and shortness of breath.   Cardiovascular:        Left anterior chest wall pain  All other systems reviewed and are negative.  Past Medical History:  Diagnosis Date   Abdominal pain, other specified site    Backache, unspecified    Carpal tunnel syndrome    Diabetes mellitus without complication (HCC)    Essential hypertension, benign    H/O hand surgery 02/25/2020   R hand    Hyperlipidemia    Impacted cerumen    Impaired fasting glucose    Other abnormal blood chemistry    Pain in joint, lower leg    Special screening for malignant neoplasm of prostate    Unspecified essential hypertension    Unspecified hypothyroidism    Past Surgical History:  Procedure Laterality Date   (L) KNEE SURGERY"TORN MENISCUS"     EYE SURGERY Left 11/24/2014   cataracts   Social History:   reports that he quit smoking about 55 years ago. His smoking use included cigarettes. He has never used smokeless tobacco. He reports that he does not drink alcohol and does not use  drugs.  Family History  Problem Relation Age of Onset   Alzheimer's disease Sister    Cancer Brother 103       lung   Diabetes Sister    Diabetes Sister    Lung cancer Sister 32       mets to back   Kidney disease Sister    Allergies Sister    Obesity Sister    Cancer Mother     Medications: Patient's Medications  New Prescriptions   LIDOCAINE (LIDODERM) 5 %    Place 1 patch onto the skin daily. Remove & Discard patch within 12 hours or as directed by MD  Previous Medications   ASCORBIC ACID (VITAMIN C) 1000 MG TABLET    Take 1,000 mg by mouth 2 (two) times daily.   ASPIRIN 81 MG TABLET    Take 81 mg by mouth daily.   ASPIRIN-SALICYLAMIDE-CAFFEINE (BC HEADACHE POWDER PO)    Take by mouth as needed.   ATORVASTATIN (LIPITOR) 40 MG TABLET    Take one tablet by mouth once daily to lower cholesterol.   LATANOPROST (XALATAN) 0.005 % OPHTHALMIC SOLUTION    Place 1 drop into both eyes at bedtime.   LISINOPRIL-HYDROCHLOROTHIAZIDE (ZESTORETIC) 20-12.5 MG TABLET    TAKE 1 TABLET DAILY   METFORMIN (GLUCOPHAGE) 500 MG TABLET    TAKE 1 TABLET IN THE MORNING  AND TAKE ONE-HALF (1/2) TABLET IN THE EVENING   MULTIPLE VITAMINS-MINERALS (MULTIVITAMIN & MINERAL PO)    Take 1 tablet by mouth daily.   PREGABALIN (LYRICA) 75 MG CAPSULE    Take 75 mg by mouth 2 (two) times daily.   SILDENAFIL (VIAGRA) 50 MG TABLET    TAKE 1 TABLET AS NEEDED FOR ERECTILE DYSFUNCTION   VITAMIN B-12 (CYANOCOBALAMIN) 1000 MCG TABLET    Take 500 mcg by mouth daily.   Modified Medications   No medications on file  Discontinued Medications   No medications on file    Physical Exam:  Vitals:   11/07/21 0856  BP: 134/76  Pulse: 60  Temp: (!) 96.5 F (35.8 C)  SpO2: 98%  Weight: 140 lb 12.8 oz (63.9 kg)  Height: 5\' 6"  (1.676 m)   Body mass index is 22.73 kg/m. Wt Readings from Last 3 Encounters:  11/07/21 140 lb 12.8 oz (63.9 kg)  06/09/21 138 lb (62.6 kg)  01/27/21 140 lb 12.8 oz (63.9 kg)    Physical  Exam Vitals and nursing note reviewed.  Constitutional:      Appearance: Normal appearance.  Cardiovascular:     Rate and Rhythm: Normal rate and regular rhythm.  Pulmonary:     Effort: Pulmonary effort is normal.     Breath sounds: Normal breath sounds.     Comments: Left anterior chest wall.  There is bruising in various stages of resolution there is some yellowish-green tent to the skin where there is breakdown of hemoglobin and then some mild bruising. There is no tenderness on compression of the ribs but there is tenderness over it with direct compression over the area involved Neurological:     Mental Status: He is alert.    Labs reviewed: Basic Metabolic Panel: Recent Labs    01/24/21 0859 06/02/21 0908  NA 141 137  K 4.2 4.1  CL 103 101  CO2 31 27  GLUCOSE 91 88  BUN 15 15  CREATININE 0.94 0.97  CALCIUM 9.6 10.1  TSH 2.83 2.50   Liver Function Tests: Recent Labs    01/24/21 0859 06/02/21 0908  AST 18 20  ALT 17 20  BILITOT 0.5 1.0  PROT 7.0 7.3   No results for input(s): LIPASE, AMYLASE in the last 8760 hours. No results for input(s): AMMONIA in the last 8760 hours. CBC: Recent Labs    01/24/21 0859  WBC 9.8  NEUTROABS 6,115  HGB 15.5  HCT 45.7  MCV 90.0  PLT 314   Lipid Panel: Recent Labs    01/24/21 0859  CHOL 125  HDL 47  LDLCALC 62  TRIG 82  CHOLHDL 2.7   TSH: Recent Labs    01/24/21 0859 06/02/21 0908  TSH 2.83 2.50   A1C: Lab Results  Component Value Date   HGBA1C 5.6 06/02/2021     Assessment/Plan  1. Chest wall pain Patient has some doubts that this is a problem but I do not see any other more serious issues that are likely the cause.  I have suggested local heat as well as application of topical gel or Salonpas patch for discomfort - lidocaine (LIDODERM) 5 %; Place 1 patch onto the skin daily. Remove & Discard patch within 12 hours or as directed by MD  Dispense: 30 patch; Refill: 0   06/04/2021, MD [facility] [phone]

## 2021-11-07 NOTE — Patient Instructions (Signed)
Could try Salonpas for discomfort Apply local heat 15-20 minutes/day

## 2021-12-13 ENCOUNTER — Other Ambulatory Visit: Payer: Medicare HMO

## 2021-12-13 ENCOUNTER — Other Ambulatory Visit: Payer: Self-pay

## 2021-12-13 DIAGNOSIS — E785 Hyperlipidemia, unspecified: Secondary | ICD-10-CM

## 2021-12-13 DIAGNOSIS — E1142 Type 2 diabetes mellitus with diabetic polyneuropathy: Secondary | ICD-10-CM

## 2021-12-13 DIAGNOSIS — I10 Essential (primary) hypertension: Secondary | ICD-10-CM

## 2021-12-14 LAB — CBC WITH DIFFERENTIAL/PLATELET
Absolute Monocytes: 842 cells/uL (ref 200–950)
Basophils Absolute: 59 cells/uL (ref 0–200)
Basophils Relative: 0.6 %
Eosinophils Absolute: 198 cells/uL (ref 15–500)
Eosinophils Relative: 2 %
HCT: 44.9 % (ref 38.5–50.0)
Hemoglobin: 15.1 g/dL (ref 13.2–17.1)
Lymphs Abs: 3010 cells/uL (ref 850–3900)
MCH: 30.8 pg (ref 27.0–33.0)
MCHC: 33.6 g/dL (ref 32.0–36.0)
MCV: 91.4 fL (ref 80.0–100.0)
MPV: 11.2 fL (ref 7.5–12.5)
Monocytes Relative: 8.5 %
Neutro Abs: 5792 cells/uL (ref 1500–7800)
Neutrophils Relative %: 58.5 %
Platelets: 355 10*3/uL (ref 140–400)
RBC: 4.91 10*6/uL (ref 4.20–5.80)
RDW: 11.5 % (ref 11.0–15.0)
Total Lymphocyte: 30.4 %
WBC: 9.9 10*3/uL (ref 3.8–10.8)

## 2021-12-14 LAB — BASIC METABOLIC PANEL
BUN: 8 mg/dL (ref 7–25)
CO2: 28 mmol/L (ref 20–32)
Calcium: 9.3 mg/dL (ref 8.6–10.3)
Chloride: 102 mmol/L (ref 98–110)
Creat: 0.94 mg/dL (ref 0.70–1.28)
Glucose, Bld: 86 mg/dL (ref 65–99)
Potassium: 4.1 mmol/L (ref 3.5–5.3)
Sodium: 139 mmol/L (ref 135–146)

## 2021-12-14 LAB — HEPATIC FUNCTION PANEL
AG Ratio: 1.8 (calc) (ref 1.0–2.5)
ALT: 18 U/L (ref 9–46)
AST: 17 U/L (ref 10–35)
Albumin: 4.3 g/dL (ref 3.6–5.1)
Alkaline phosphatase (APISO): 72 U/L (ref 35–144)
Bilirubin, Direct: 0.1 mg/dL (ref 0.0–0.2)
Globulin: 2.4 g/dL (calc) (ref 1.9–3.7)
Indirect Bilirubin: 0.5 mg/dL (calc) (ref 0.2–1.2)
Total Bilirubin: 0.6 mg/dL (ref 0.2–1.2)
Total Protein: 6.7 g/dL (ref 6.1–8.1)

## 2021-12-14 LAB — HEMOGLOBIN A1C
Hgb A1c MFr Bld: 5.7 % of total Hgb — ABNORMAL HIGH (ref <5.7)
Mean Plasma Glucose: 117 mg/dL
eAG (mmol/L): 6.5 mmol/L

## 2021-12-15 ENCOUNTER — Other Ambulatory Visit: Payer: Self-pay

## 2021-12-15 ENCOUNTER — Encounter: Payer: Self-pay | Admitting: Orthopedic Surgery

## 2021-12-15 ENCOUNTER — Other Ambulatory Visit: Payer: Self-pay | Admitting: Orthopedic Surgery

## 2021-12-15 ENCOUNTER — Ambulatory Visit: Payer: Medicare HMO | Admitting: Orthopedic Surgery

## 2021-12-15 ENCOUNTER — Ambulatory Visit (INDEPENDENT_AMBULATORY_CARE_PROVIDER_SITE_OTHER): Payer: Medicare HMO | Admitting: Orthopedic Surgery

## 2021-12-15 VITALS — BP 120/70 | HR 67 | Temp 96.9°F | Ht 65.5 in | Wt 141.4 lb

## 2021-12-15 DIAGNOSIS — E1169 Type 2 diabetes mellitus with other specified complication: Secondary | ICD-10-CM

## 2021-12-15 DIAGNOSIS — K219 Gastro-esophageal reflux disease without esophagitis: Secondary | ICD-10-CM

## 2021-12-15 DIAGNOSIS — M25522 Pain in left elbow: Secondary | ICD-10-CM

## 2021-12-15 DIAGNOSIS — I10 Essential (primary) hypertension: Secondary | ICD-10-CM | POA: Diagnosis not present

## 2021-12-15 DIAGNOSIS — M5432 Sciatica, left side: Secondary | ICD-10-CM

## 2021-12-15 DIAGNOSIS — E785 Hyperlipidemia, unspecified: Secondary | ICD-10-CM

## 2021-12-15 DIAGNOSIS — E1142 Type 2 diabetes mellitus with diabetic polyneuropathy: Secondary | ICD-10-CM

## 2021-12-15 DIAGNOSIS — R0789 Other chest pain: Secondary | ICD-10-CM

## 2021-12-15 NOTE — Patient Instructions (Addendum)
Follow up with Dr. Ranell Patrick about left shoulder  Try tums (2 daily) daily to see if reflux improved- try to find food triggers  Piriformis syndrome- Try alleve daily x 3-5 days to see if left sciatic pain improves- always take alleve with food- may also try stretching

## 2021-12-15 NOTE — Progress Notes (Signed)
Careteam: Patient Care Team: Octavia Heir, NP as PCP - General (Adult Health Nurse Practitioner) [name], MD as Consulting Physician (Ophthalmology) [name], MD as Consulting Physician (Gastroenterology)  Seen by: [name], AGNP-C  PLACE OF SERVICE:  [facility]  Advanced Directive information    Allergies  Allergen Reactions   Penicillins Swelling   Shellfish Allergy Swelling    Chief Complaint  Patient presents with   Medical Management of Chronic Issues    Patient presents today for a 6 month follow-up.   Quality Metric Gaps    Flu, COVID booster     HPI: Patient is a 80 y.o. male seen today for medical management of chronic conditions.   Labs discussed with patient.   Chest wall pain- pain has since subsided, he is not using lidocaine patches, believes it may have been related to reflux. Pain subsides when taking baking soda.   He has been having left arm pain and left leg pain for a few weeks. Denies left shoulder pain. Intermittent pain radiates down left arm, rated 4/10. He has not tried any interventions to relieve pain. He is followed by Dr. Ranell Patrick for past orthopedic issues, including shoulder replacement.   Left leg pain intermittent. Pain radiates down left leg. Pain described as sharp or shooting. He has not tired any interventions for pain. History of left knee pain in past, followed by Dr. Ranell Patrick.   Blood pressure controlled. Continues to take lisinopril-HCTZ daily. Denies chest pain or sob.   Continues to take Lipitor daily. Also follows low fat diet.   Diabetes- no hypoglycemic events, remains on metformin diabetic eye exam scheduled 02/08/2021.   Received flu vaccine 10/2021 at [facility].   Received bivariant covid booster 11/21/2021 at [facility].    Review of Systems:  Review of Systems  Constitutional:  Negative for chills, fever, malaise/fatigue and weight loss.  HENT:  Negative for hearing loss and sore throat.   Eyes:   Negative for discharge and redness.  Respiratory:  Negative for cough, shortness of breath and wheezing.   Cardiovascular:  Positive for chest pain. Negative for leg swelling.  Gastrointestinal:  Positive for heartburn. Negative for abdominal pain, constipation, diarrhea, nausea and vomiting.  Genitourinary:  Negative for dysuria and frequency.  Musculoskeletal:  Positive for joint pain, myalgias and neck pain. Negative for falls.  Neurological:  Negative for dizziness, weakness and headaches.  Psychiatric/Behavioral:  Negative for depression. The patient is not nervous/anxious.    Past Medical History:  Diagnosis Date   Abdominal pain, other specified site    Backache, unspecified    Carpal tunnel syndrome    Diabetes mellitus without complication (HCC)    Essential hypertension, benign    H/O hand surgery 02/25/2020   R hand    Hyperlipidemia    Impacted cerumen    Impaired fasting glucose    Other abnormal blood chemistry    Pain in joint, lower leg    Special screening for malignant neoplasm of prostate    Unspecified essential hypertension    Unspecified hypothyroidism    Past Surgical History:  Procedure Laterality Date   (L) KNEE SURGERY"TORN MENISCUS"     EYE SURGERY Left 11/24/2014   cataracts   Social History:   reports that he quit smoking about 55 years ago. His smoking use included cigarettes. He has never used smokeless tobacco. He reports that he does not drink alcohol and does not use drugs.  Family History  Problem Relation Age of  Onset   Alzheimer's disease Sister    Cancer Brother 2       lung   Diabetes Sister    Diabetes Sister    Lung cancer Sister 54       mets to back   Kidney disease Sister    Allergies Sister    Obesity Sister    Cancer Mother     Medications: Patient's Medications  New Prescriptions   No medications on file  Previous Medications   ASCORBIC ACID (VITAMIN C) 1000 MG TABLET    Take 1,000 mg by mouth 2 (two) times daily.    ASPIRIN 81 MG TABLET    Take 81 mg by mouth daily.   ASPIRIN-SALICYLAMIDE-CAFFEINE (BC HEADACHE POWDER PO)    Take by mouth as needed.   ATORVASTATIN (LIPITOR) 40 MG TABLET    Take one tablet by mouth once daily to lower cholesterol.   LATANOPROST (XALATAN) 0.005 % OPHTHALMIC SOLUTION    Place 1 drop into both eyes at bedtime.   LIDOCAINE (LIDODERM) 5 %    Place 1 patch onto the skin daily. Remove & Discard patch within 12 hours or as directed by MD   LISINOPRIL-HYDROCHLOROTHIAZIDE (ZESTORETIC) 20-12.5 MG TABLET    TAKE 1 TABLET DAILY   METFORMIN (GLUCOPHAGE) 500 MG TABLET    TAKE 1 TABLET IN THE MORNING AND TAKE ONE-HALF (1/2) TABLET IN THE EVENING   MULTIPLE VITAMINS-MINERALS (MULTIVITAMIN & MINERAL PO)    Take 1 tablet by mouth daily.   PREGABALIN (LYRICA) 75 MG CAPSULE    Take 75 mg by mouth 2 (two) times daily.   SILDENAFIL (VIAGRA) 50 MG TABLET    TAKE 1 TABLET AS NEEDED FOR ERECTILE DYSFUNCTION   VITAMIN B-12 (CYANOCOBALAMIN) 1000 MCG TABLET    Take 500 mcg by mouth daily.   Modified Medications   No medications on file  Discontinued Medications   No medications on file    Physical Exam:  Vitals:   12/15/21 1052  BP: 120/70  Pulse: 67  Temp: (!) 96.9 F (36.1 C)  SpO2: 98%  Weight: 141 lb 6.4 oz (64.1 kg)  Height: 5' 5.5" (1.664 m)   Body mass index is 23.17 kg/m. Wt Readings from Last 3 Encounters:  12/15/21 141 lb 6.4 oz (64.1 kg)  11/07/21 140 lb 12.8 oz (63.9 kg)  06/09/21 138 lb (62.6 kg)    Physical Exam Vitals reviewed.  Constitutional:      General: He is not in acute distress. HENT:     Head: Normocephalic.  Eyes:     General:        Right eye: No discharge.        Left eye: No discharge.  Neck:     Vascular: No carotid bruit.  Cardiovascular:     Rate and Rhythm: Normal rate and regular rhythm.     Pulses: Normal pulses.     Heart sounds: Normal heart sounds. No murmur heard. Pulmonary:     Effort: Pulmonary effort is normal. No respiratory  distress.     Breath sounds: Normal breath sounds. No wheezing.  Abdominal:     General: Bowel sounds are normal. There is no distension.     Palpations: Abdomen is soft.     Tenderness: There is no abdominal tenderness.  Musculoskeletal:     Left upper arm: No swelling, deformity or tenderness.     Cervical back: Normal range of motion.     Left lower leg: No swelling or tenderness.  No edema.     Comments: FROM  Lymphadenopathy:     Cervical: No cervical adenopathy.  Skin:    General: Skin is warm and dry.     Capillary Refill: Capillary refill takes less than 2 seconds.  Neurological:     General: No focal deficit present.     Mental Status: He is alert and oriented to person, place, and time.  Psychiatric:        Mood and Affect: Mood normal.        Behavior: Behavior normal.    Labs reviewed: Basic Metabolic Panel: Recent Labs    01/24/21 0859 06/02/21 0908 12/13/21 0909  NA 141 137 139  K 4.2 4.1 4.1  CL 103 101 102  CO2 31 27 28   GLUCOSE 91 88 86  BUN 15 15 8   CREATININE 0.94 0.97 0.94  CALCIUM 9.6 10.1 9.3  TSH 2.83 2.50  --    Liver Function Tests: Recent Labs    01/24/21 0859 06/02/21 0908 12/13/21 0909  AST 18 20 17   ALT 17 20 18   BILITOT 0.5 1.0 0.6  PROT 7.0 7.3 6.7   No results for input(s): LIPASE, AMYLASE in the last 8760 hours. No results for input(s): AMMONIA in the last 8760 hours. CBC: Recent Labs    01/24/21 0859 12/13/21 0909  WBC 9.8 9.9  NEUTROABS 6,115 5,792  HGB 15.5 15.1  HCT 45.7 44.9  MCV 90.0 91.4  PLT 314 355   Lipid Panel: Recent Labs    01/24/21 0859  CHOL 125  HDL 47  LDLCALC 62  TRIG 82  CHOLHDL 2.7   TSH: Recent Labs    01/24/21 0859 06/02/21 0908  TSH 2.83 2.50   A1C: Lab Results  Component Value Date   HGBA1C 5.7 (H) 12/13/2021     Assessment/Plan 1. Chest wall pain - resolved, suspect reflux  2. Left elbow pain - intermittent sharp pain down left arm - exam unremarkable - recommend  trying alleve for 3-5 consecutive days- advised to take with food - recommend seeing Dr. 03/24/21 if pain consists  3. 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 Essential hypertension, benign - controlled - cont lisinopril -HCTZ - cbc/diff- future - cmp- future  5. Hyperlipidemia associated with type 2 diabetes mellitus (HCC) - cont statin - lipid panel- future  6. Controlled type 2 diabetes mellitus with diabetic polyneuropathy, without long-term current use of insulin (HCC) - A1c 5.7 12/13/2021 - cont asa, metformin, ACE, and statin - diabetic eye exam scheduled 02/08/2021  7. Gastroesophageal reflux disease without esophagitis - suspect increased reflux in past month - chest pain resolved with baking soda - advise trying to pinpoint food triggers - recommend trying 2 tums daily - recommend trying PPI x 2 weeks if tums unsuccessful  Total time: 32 minutes. Greater than 50% of total time spent doing patient education regarding orthopedic pain, health maintenance and medication management.    Next appt: Visit date not found  Kaloni Bisaillon 12/15/2021  [facility] [phone]

## 2022-01-09 ENCOUNTER — Other Ambulatory Visit: Payer: Self-pay | Admitting: *Deleted

## 2022-01-09 DIAGNOSIS — E1129 Type 2 diabetes mellitus with other diabetic kidney complication: Secondary | ICD-10-CM

## 2022-01-09 MED ORDER — LISINOPRIL-HYDROCHLOROTHIAZIDE 20-12.5 MG PO TABS: 1.0000 | ORAL_TABLET | Freq: Every day | ORAL | 1 refills | Status: DC

## 2022-01-09 NOTE — Telephone Encounter (Signed)
Patient requested refill

## 2022-01-20 ENCOUNTER — Other Ambulatory Visit: Payer: Self-pay

## 2022-01-20 MED ORDER — METFORMIN HCL 500 MG PO TABS: ORAL_TABLET | ORAL | 1 refills | Status: DC

## 2022-01-20 NOTE — Telephone Encounter (Signed)
Patient request refill on medication 

## 2022-02-06 ENCOUNTER — Telehealth: Payer: Self-pay

## 2022-02-06 NOTE — Telephone Encounter (Signed)
Patient called to schedule appointment with Dionicia Abler and when asked reason for visit patient reported chest pain/ pressure that he had for a few weeks now and possibly want an EKG. He was offered a sooner appointment than Thursday,02/09/22 and patient declined the appointment with another provider. Patient states that [name], NP already know what is going on with him and that's who he would like to see. Appointment was schedule per pts request.

## 2022-02-08 ENCOUNTER — Encounter: Payer: Self-pay | Admitting: Orthopedic Surgery

## 2022-02-09 ENCOUNTER — Ambulatory Visit (INDEPENDENT_AMBULATORY_CARE_PROVIDER_SITE_OTHER): Payer: Medicare HMO | Admitting: Orthopedic Surgery

## 2022-02-09 ENCOUNTER — Other Ambulatory Visit: Payer: Self-pay

## 2022-02-09 ENCOUNTER — Encounter: Payer: Self-pay | Admitting: Orthopedic Surgery

## 2022-02-09 VITALS — BP 124/66 | HR 78 | Temp 91.0°F | Ht 65.0 in | Wt 140.8 lb

## 2022-02-09 DIAGNOSIS — M25512 Pain in left shoulder: Secondary | ICD-10-CM | POA: Diagnosis not present

## 2022-02-09 DIAGNOSIS — K219 Gastro-esophageal reflux disease without esophagitis: Secondary | ICD-10-CM

## 2022-02-09 DIAGNOSIS — R0789 Other chest pain: Secondary | ICD-10-CM

## 2022-02-09 DIAGNOSIS — R079 Chest pain, unspecified: Secondary | ICD-10-CM

## 2022-02-09 MED ORDER — OMEPRAZOLE 20 MG PO CPDR: 20.0000 mg | DELAYED_RELEASE_CAPSULE | Freq: Every day | ORAL | 3 refills | Status: DC

## 2022-02-09 MED ORDER — GABAPENTIN 100 MG PO CAPS: 100.0000 mg | ORAL_CAPSULE | Freq: Every day | ORAL | 1 refills | Status: DC

## 2022-02-09 NOTE — Patient Instructions (Addendum)
Please schedule with Dr. Ranell Patrick so he can examine shoulder   Please trial omeprazole daily for 2 weeks- for heartburn  Please trial gabapentin for 2 weeks- one pill at night- medication helps with nerve pain- may increase medication to 2 pills after 14 days if medication helps but not enough- medication may cause drowsiness

## 2022-02-09 NOTE — Progress Notes (Signed)
Careteam: Patient Care Team: Tyler Heir, NP as PCP - General (Adult Health Nurse Practitioner) Tyler Flow, MD as Consulting Physician (Ophthalmology) Tyler Hawking, MD as Consulting Physician (Gastroenterology)  Seen by: Tyler Hull, AGNP-C  PLACE OF SERVICE:  [facility]  Advanced Directive information Does Patient Have a Medical Advance Directive?: Yes, Type of Advance Directive: Healthcare Power of Tyler Hull;Living will, Does patient want to make changes to medical advance directive?: No - Patient declined  Allergies  Allergen Reactions   Penicillins Swelling   Shellfish Allergy Swelling    Chief Complaint  Patient presents with   Acute Visit    Chest pain and pressure. Patient is with twin sister     HPI: Patient is a 81 y.o. male seen today for acute chest wall pain.   He has had ongoing chest wall pain for the past few months. EKG NSR today. He was seen 10/2021 and 11/2021 for chest wall pain- described as a knot to upper left chest. Bruising noted during visit with Tyler Hull. He was given lidocaine patches and symptoms resolved. Second encounter related to reflux. Symptoms resolved with baking soda. He was also advised to try tums. Today, chest wall pain remains to left upper chest. Pain increased with movement, described intermittent soreness. He reports taking tums prn, unsure if symptoms are related to heartburn. He also reports increased left shoulder pain. Describes intermittent numbness, vibration and tingling radiating down left arm at times. Symptoms stimulated with movement. He is a patient of Tyler Hull at [facility]. Advised to follow up with ortho last visit, but didn't. He plans to schedule a visit with him soon. He denies chest heaviness/pressure, palpitations, sob, diaphoresis or dizziness.    Review of Systems:  Review of Systems  Constitutional: Negative.   HENT: Negative.    Eyes: Negative.   Respiratory:  Negative for cough, shortness of  breath and wheezing.   Cardiovascular:  Positive for chest pain. Negative for palpitations and leg swelling.  Gastrointestinal:  Positive for heartburn. Negative for nausea and vomiting.  Genitourinary: Negative.   Musculoskeletal:  Positive for joint pain.       Left shoulder pain  Skin: Negative.   Neurological:  Negative for dizziness, weakness and headaches.  Psychiatric/Behavioral:  Negative for depression. The patient is not nervous/anxious.    Past Medical History:  Diagnosis Date   Abdominal pain, other specified site    Backache, unspecified    Carpal tunnel syndrome    Diabetes mellitus without complication (HCC)    Essential hypertension, benign    H/O hand surgery 02/25/2020   R hand    Hyperlipidemia    Impacted cerumen    Impaired fasting glucose    Other abnormal blood chemistry    Pain in joint, lower leg    Special screening for malignant neoplasm of prostate    Unspecified essential hypertension    Unspecified hypothyroidism    Past Surgical History:  Procedure Laterality Date   (L) KNEE SURGERY"TORN MENISCUS"     EYE SURGERY Left 11/24/2014   cataracts   Social History:   reports that he quit smoking about 56 years ago. His smoking use included cigarettes. He has never used smokeless tobacco. He reports that he does not drink alcohol and does not use drugs.  Family History  Problem Relation Age of Onset   Alzheimer's disease Sister    Cancer Brother 65       lung   Diabetes Sister  Diabetes Sister    Lung cancer Sister 42       mets to back   Kidney disease Sister    Allergies Sister    Obesity Sister    Cancer Mother     Medications: Patient's Medications  New Prescriptions   No medications on file  Previous Medications   ASCORBIC ACID (VITAMIN C) 1000 MG TABLET    Take 1,000 mg by mouth 2 (two) times daily.   ASPIRIN 81 MG TABLET    Take 81 mg by mouth daily.   ASPIRIN-SALICYLAMIDE-CAFFEINE (BC HEADACHE POWDER PO)    Take by mouth as  needed.   ATORVASTATIN (LIPITOR) 40 MG TABLET    Take one tablet by mouth once daily to lower cholesterol.   LATANOPROST (XALATAN) 0.005 % OPHTHALMIC SOLUTION    Place 1 drop into both eyes at bedtime.   LIDOCAINE (LIDODERM) 5 %    Place 1 patch onto the skin as needed. Remove & Discard patch within 12 hours or as directed by MD   LISINOPRIL-HYDROCHLOROTHIAZIDE (ZESTORETIC) 20-12.5 MG TABLET    Take 1 tablet by mouth daily.   METFORMIN (GLUCOPHAGE) 500 MG TABLET    TAKE 1 TABLET IN THE MORNING AND TAKE ONE-HALF (1/2) TABLET IN THE EVENING   MULTIPLE VITAMINS-MINERALS (MULTIVITAMIN & MINERAL PO)    Take 1 tablet by mouth daily.   PREGABALIN (LYRICA) 75 MG CAPSULE    Take 75 mg by mouth 2 (two) times daily.   SILDENAFIL (VIAGRA) 50 MG TABLET    TAKE 1 TABLET AS NEEDED FOR ERECTILE DYSFUNCTION   VITAMIN B-12 (CYANOCOBALAMIN) 1000 MCG TABLET    Take 500 mcg by mouth daily.   Modified Medications   No medications on file  Discontinued Medications   LIDOCAINE (LIDODERM) 5 %    Place 1 patch onto the skin daily. Remove & Discard patch within 12 hours or as directed by MD    Physical Exam:  Vitals:   02/09/22 1008  BP: 124/66  Pulse: 78  Temp: (!) 91 F (32.8 C)  Weight: 140 lb 12.8 oz (63.9 kg)  Height: 5\' 5"  (1.651 m)   Body mass index is 23.43 kg/m. Wt Readings from Last 3 Encounters:  02/09/22 140 lb 12.8 oz (63.9 kg)  12/15/21 141 lb 6.4 oz (64.1 kg)  11/07/21 140 lb 12.8 oz (63.9 kg)    Physical Exam Vitals reviewed.  Constitutional:      General: He is not in acute distress.    Appearance: He is not ill-appearing.  HENT:     Head: Normocephalic.  Eyes:     General:        Right eye: No discharge.        Left eye: No discharge.  Neck:     Vascular: No carotid bruit.     Comments: No JVD Cardiovascular:     Rate and Rhythm: Normal rate and regular rhythm.     Pulses: Normal pulses.     Heart sounds: Normal heart sounds. No murmur heard. Pulmonary:     Effort:  Pulmonary effort is normal. No respiratory distress.     Breath sounds: Normal breath sounds. No wheezing.  Chest:     Chest wall: No mass, deformity, swelling or tenderness.  Breasts:    Left: Normal. No swelling.  Abdominal:     General: Bowel sounds are normal. There is no distension.     Palpations: Abdomen is soft.     Tenderness: There is no abdominal  tenderness.  Musculoskeletal:     Left shoulder: No swelling, deformity, tenderness or crepitus. Normal range of motion. Normal strength.     Cervical back: Neck supple.     Right lower leg: No edema.     Left lower leg: No edema.  Lymphadenopathy:     Cervical: No cervical adenopathy.     Upper Body:     Left upper body: No supraclavicular, axillary or pectoral adenopathy.  Skin:    General: Skin is warm and dry.     Capillary Refill: Capillary refill takes less than 2 seconds.  Neurological:     General: No focal deficit present.     Mental Status: He is alert and oriented to person, place, and time.     Motor: No weakness.     Gait: Gait normal.  Psychiatric:        Mood and Affect: Mood normal.        Behavior: Behavior normal.    Labs reviewed: Basic Metabolic Panel: Recent Labs    06/02/21 0908 12/13/21 0909  NA 137 139  K 4.1 4.1  CL 101 102  CO2 27 28  GLUCOSE 88 86  BUN 15 8  CREATININE 0.97 0.94  CALCIUM 10.1 9.3  TSH 2.50  --    Liver Function Tests: Recent Labs    06/02/21 0908 12/13/21 0909  AST 20 17  ALT 20 18  BILITOT 1.0 0.6  PROT 7.3 6.7   No results for input(s): LIPASE, AMYLASE in the last 8760 hours. No results for input(s): AMMONIA in the last 8760 hours. CBC: Recent Labs    12/13/21 0909  WBC 9.9  NEUTROABS 5,792  HGB 15.1  HCT 44.9  MCV 91.4  PLT 355   Lipid Panel: No results for input(s): CHOL, HDL, LDLCALC, TRIG, CHOLHDL, LDLDIRECT in the last 8760 hours. TSH: Recent Labs    06/02/21 0908  TSH 2.50   A1C: Lab Results  Component Value Date   HGBA1C 5.7 (H)  12/13/2021     Assessment/Plan 1. Chest wall pain - involves left chest, pain stimulated with movement - EKG 12-Lead- NSR today -  reflux, left pectoral muscle involvement, or left shoulder pain?- 3rd encounter in 4 months - recommend 2 week trial to gabapentin to see if symptoms improve - if symptoms persist may consider cardiology referral in future - advised to report to ED if symptoms worsen- signs of MI discussed - gabapentin (NEURONTIN) 100 MG capsule; Take 1 capsule (100 mg total) by mouth at bedtime.  Dispense: 30 capsule; Refill: 1  2. Gastroesophageal reflux disease without esophagitis - taking tums prn with some improvement - recommend 2 week trial of omeprazole to see if symptoms improve - omeprazole (PRILOSEC) 20 MG capsule; Take 1 capsule (20 mg total) by mouth daily.  Dispense: 30 capsule; Refill: 3   3. Acute pain of left shoulder - increased pain x 3 months - exam unremarkable, FROM - radiation down left arm with some arm movements - recommend scheduling with Tyler Hull - advised to trial gabapentin x 2 weeks - gabapentin (NEURONTIN) 100 MG capsule; Take 1 capsule (100 mg total) by mouth at bedtime.  Dispense: 30 capsule; Refill: 1  Total time: 38 minutes. Greater than 50% of total time spent doing patient education regarding chest wall pain, GERD, left shoulder pain and signs of MI.   Next appt: 06/29/2022  Tyler Hull, Juel Burrow  [facility] [phone]

## 2022-03-02 ENCOUNTER — Ambulatory Visit (INDEPENDENT_AMBULATORY_CARE_PROVIDER_SITE_OTHER): Payer: Medicare HMO | Admitting: Family

## 2022-03-02 ENCOUNTER — Other Ambulatory Visit: Payer: Self-pay

## 2022-03-02 ENCOUNTER — Encounter: Payer: Self-pay | Admitting: Family

## 2022-03-02 VITALS — BP 128/69 | HR 76 | Wt 140.7 lb

## 2022-03-02 DIAGNOSIS — Z0000 Encounter for general adult medical examination without abnormal findings: Secondary | ICD-10-CM

## 2022-03-02 NOTE — Progress Notes (Signed)
This service is provided via telemedicine  Vital signs collected/recorded by [employer]. Vitals given by patient wife Mrs.[name].   Location of patient (ex: home, work):  Home.  Patient consents to a telephone visit:  Yes  Location of the provider (ex: office, home):  [employer].  Name of any referring provider:  Octavia Heir, NP   Names of all persons participating in the telemedicine service and their role in the encounter:  Patient, Meda Klinefelter, RMA, Donn Wilmot, Carilyn Goodpasture, NP.    Time spent on call:  8 minutes spent on the phone with Medical Assistant.      Subjective:   Tyler Hull is a 81 y.o. male who presents for Medicare Annual/Subsequent preventive examination.  Review of Systems    Cardiac Risk Factors include: advanced age (>78men, >78 women);diabetes mellitus;hypertension;dyslipidemia;smoking/ tobacco exposure;male gender     Objective:    Today's Vitals   03/02/22 1130  BP: 128/69  Pulse: 76  Weight: 140 lb 11.2 oz (63.8 kg)   Body mass index is 23.41 kg/m.  Advanced Directives 03/02/2022 02/08/2022 02/24/2021 01/27/2021 09/23/2020 05/13/2020 02/19/2020  Does Patient Have a Medical Advance Directive? Yes Yes Yes Yes Yes Yes No  Type of Estate agent of [place];Living will Healthcare Power of [place];Living will Healthcare Power of [place];Living will Healthcare Power of [place];Living will Healthcare Power of [place];Living will Living will;Healthcare Power of Attorney -  Does patient want to make changes to medical advance directive? No - Patient declined No - Patient declined No - Patient declined No - Patient declined No - Patient declined No - Patient declined -  Copy of Healthcare Power of Attorney in Chart? Yes - validated most recent copy scanned in chart (See row information) Yes - validated most recent copy scanned in chart (See row information) Yes - validated most recent copy scanned in chart (See  row information) Yes - validated most recent copy scanned in chart (See row information) Yes - validated most recent copy scanned in chart (See row information) Yes - validated most recent copy scanned in chart (See row information) -  Would patient like information on creating a medical advance directive? - - - - - - No - Patient declined  Pre-existing out of facility DNR order (yellow form or pink MOST form) - - - - - - -    Current Medications (verified) Outpatient Encounter Medications as of 03/02/2022  Medication Sig   Ascorbic Acid (VITAMIN C) 1000 MG tablet Take 1,000 mg by mouth 2 (two) times daily.   aspirin 81 MG tablet Take 81 mg by mouth daily.   Aspirin-Salicylamide-Caffeine (BC HEADACHE POWDER PO) Take by mouth as needed.   atorvastatin (LIPITOR) 40 MG tablet Take one tablet by mouth once daily to lower cholesterol.   latanoprost (XALATAN) 0.005 % ophthalmic solution Place 1 drop into both eyes at bedtime.   lisinopril-hydrochlorothiazide (ZESTORETIC) 20-12.5 MG tablet Take 1 tablet by mouth daily.   metFORMIN (GLUCOPHAGE) 500 MG tablet TAKE 1 TABLET IN THE MORNING AND TAKE ONE-HALF (1/2) TABLET IN THE EVENING   Multiple Vitamins-Minerals (MULTIVITAMIN & MINERAL PO) Take 1 tablet by mouth daily.   omeprazole (PRILOSEC) 20 MG capsule Take 20 mg by mouth as needed.   sildenafil (VIAGRA) 50 MG tablet TAKE 1 TABLET AS NEEDED FOR ERECTILE DYSFUNCTION   vitamin B-12 (CYANOCOBALAMIN) 1000 MCG tablet Take 500 mcg by mouth daily.    [DISCONTINUED] gabapentin (NEURONTIN) 100 MG capsule Take 1 capsule (100 mg  total) by mouth at bedtime.   [DISCONTINUED] lidocaine (LIDODERM) 5 % Place 1 patch onto the skin as needed. Remove & Discard patch within 12 hours or as directed by MD   [DISCONTINUED] omeprazole (PRILOSEC) 20 MG capsule Take 1 capsule (20 mg total) by mouth daily.   No facility-administered encounter medications on file as of 03/02/2022.    Allergies (verified) Penicillins and  Shellfish allergy   History: Past Medical History:  Diagnosis Date   Abdominal pain, other specified site    Backache, unspecified    Carpal tunnel syndrome    Diabetes mellitus without complication (HCC)    Essential hypertension, benign    H/O hand surgery 02/25/2020   R hand    Hyperlipidemia    Impacted cerumen    Impaired fasting glucose    Other abnormal blood chemistry    Pain in joint, lower leg    Special screening for malignant neoplasm of prostate    Unspecified essential hypertension    Unspecified hypothyroidism    Past Surgical History:  Procedure Laterality Date   (L) KNEE SURGERY"TORN MENISCUS"     EYE SURGERY Left 11/24/2014   cataracts   Family History  Problem Relation Age of Onset   Alzheimer's disease Sister    Cancer Brother 74       lung   Diabetes Sister    Diabetes Sister    Lung cancer Sister 53       mets to back   Kidney disease Sister    Allergies Sister    Obesity Sister    Cancer Mother    Social History   Socioeconomic History   Marital status: Married    Spouse name: Not on file   Number of children: Not on file   Years of education: Not on file   Highest education level: Not on file  Occupational History   Not on file  Tobacco Use   Smoking status: Former    Years: 13.00    Types: Cigarettes    Quit date: 01/02/1966    Years since quitting: 56.2   Smokeless tobacco: Never   Tobacco comments:    Quit at age 43   Vaping Use   Vaping Use: Never used  Substance and Sexual Activity   Alcohol use: No   Drug use: No   Sexual activity: Yes  Other Topics Concern   Not on file  Social History Narrative   Not on file   Social Determinants of Health   Financial Resource Strain: Not on file  Food Insecurity: Not on file  Transportation Needs: Not on file  Physical Activity: Not on file  Stress: Not on file  Social Connections: Not on file    Tobacco Counseling Counseling given: Not Answered Tobacco comments: Quit at  age 24    Clinical Intake:     Pain : No/denies pain     BMI - recorded: 23.43  How often do you need to have someone help you when you read instructions, pamphlets, or other written materials from your doctor or pharmacy?: 1 - Never What is the last grade level you completed in school?: 12 Grade  Diabetic?yes  Interpreter Needed?: No      Activities of Daily Living In your present state of health, do you have any difficulty performing the following activities: 03/02/2022  Hearing? N  Vision? N  Difficulty concentrating or making decisions? N  Walking or climbing stairs? N  Dressing or bathing? N  Doing errands,  shopping? N  Preparing Food and eating ? N  Using the Toilet? N  In the past six months, have you accidently leaked urine? N  Do you have problems with loss of bowel control? N  Managing your Medications? Y  Comment wife assist  Managing your Finances? N  Housekeeping or managing your Housekeeping? N  Some recent data might be hidden    Patient Care Team: Octavia Heir, NP as PCP - General (Adult Health Nurse Practitioner) [name], MD as Consulting Physician (Ophthalmology) [name], MD as Consulting Physician (Gastroenterology)  Indicate any recent Medical Services you may have received from other than [facility] providers in the past year (date may be approximate).     Assessment:   This is a routine wellness examination for Tyler Hull.  Hearing/Vision screen Hearing Screening - Comments:: No Hearing Concerns. Patient wears hearing aid in both ears. Vision Screening - Comments:: No Vision Concerns. Patient wears prescription glasses. Patient last eye exam 02/08/2022  Dietary issues and exercise activities discussed: Current Exercise Habits: Structured exercise class, Type of exercise: yoga, Time (Minutes): 45, Frequency (Times/Week): 2, Weekly Exercise (Minutes/Week): 90, Intensity: Moderate, Exercise limited by: None identified   Goals Addressed              This Visit's Progress    Increase water intake   Not on track    Starting 02/08/17, I will attempt to increase my water intake from 2 bottles per day to 5 bottles per day.        Depression Screen PHQ 2/9 Scores 03/02/2022 02/09/2022 02/24/2021 09/23/2020 05/13/2020 02/19/2020 01/12/2020  PHQ - 2 Score 0 0 0 0 0 0 0    Fall Risk Fall Risk  03/02/2022 02/09/2022 12/15/2021 06/09/2021 02/24/2021  Falls in the past year? 1 1 0 1 0  Number falls in past yr: 0 0 0 0 0  Injury with Fall? 0 0 0 1 0  Risk for fall due to : History of fall(s) No Fall Risks;History of fall(s) No Fall Risks History of fall(s) -  Follow up Falls evaluation completed Falls evaluation completed Falls evaluation completed;Education provided;Falls prevention discussed Falls evaluation completed;Education provided;Falls prevention discussed -    FALL RISK PREVENTION PERTAINING TO THE HOME:  Any stairs in or around the home? No  If so, are there any without handrails? No  Home free of loose throw rugs in walkways, pet beds, electrical cords, etc? No  Adequate lighting in your home to reduce risk of falls? Yes   ASSISTIVE DEVICES UTILIZED TO PREVENT FALLS:  Life alert? No  Use of a cane, walker or w/c? No  Grab bars in the bathroom? Yes  Shower chair or bench in shower? No  Elevated toilet seat or a handicapped toilet? No   TIMED UP AND GO:  Was the test performed? No .  Length of time to ambulate 10 feet: N/A sec.   Gait steady and fast without use of assistive device  Cognitive Function: MMSE - Mini Mental State Exam 02/18/2019 02/12/2018 02/08/2017 01/03/2016  Orientation to time 5 4 5 5   Orientation to Place 5 5 5 5   Registration 3 3 3 3   Attention/ Calculation 5 5 5 5   Recall 3 2 3 3   Language- name 2 objects 2 2 2 2   Language- repeat 1 1 1 1   Language- follow 3 step command 3 3 3 3   Language- read & follow direction 1 1 1 1   Write a sentence 1 1 1  1  Copy design 1 1 1 1   Total score 30 28 30  30      6CIT Screen 03/02/2022 02/24/2021 02/19/2020  What Year? 0 points 0 points 0 points  What month? 0 points 0 points 0 points  What time? 0 points 0 points 0 points  Count back from 20 0 points 2 points 0 points  Months in reverse 0 points 2 points 0 points  Repeat phrase 0 points 0 points 2 points  Total Score 0 4 2    Immunizations Immunization History  Administered Date(s) Administered   Fluad Quad(high Dose 65+) 09/08/2019, 09/07/2020, 10/18/2021   Influenza, High Dose Seasonal PF 08/30/2017, 09/09/2018   Influenza,inj,Quad PF,6+ Mos 08/31/2014, 09/30/2015, 08/03/2016   Influenza,inj,quad, With Preservative 09/17/2018   Influenza-Unspecified 09/17/2012, 09/17/2013   PFIZER(Purple Top)SARS-COV-2 Vaccination 01/09/2020, 01/30/2020, 03/31/2020, 08/24/2020, 11/21/2021   Pfizer Covid-19 Vaccine Bivalent Booster 73yrs & up 03/31/2021   Pneumococcal Conjugate-13 11/26/2014, 11/27/2014   Pneumococcal Polysaccharide-23 12/03/2007   Td 12/03/2007   Tdap 12/26/2017   Zoster Recombinat (Shingrix) 06/01/2017, 11/14/2017   Zoster, Live 03/15/2013    TDAP status: Up to date  Flu Vaccine status: Up to date  Pneumococcal vaccine status: Up to date  Covid-19 vaccine status: Completed vaccines  Qualifies for Shingles Vaccine? Yes   Zostavax completed Yes   Shingrix Completed?: Yes  Screening Tests Health Maintenance  Topic Date Due   FOOT EXAM  06/09/2022   HEMOGLOBIN A1C  06/13/2022   OPHTHALMOLOGY EXAM  02/08/2023   TETANUS/TDAP  12/27/2027   Pneumonia Vaccine 55+ Years old  Completed   INFLUENZA VACCINE  Completed   COVID-19 Vaccine  Completed   Hepatitis C Screening  Completed   Zoster Vaccines- Shingrix  Completed   HPV VACCINES  Aged Out    Health Maintenance  There are no preventive care reminders to display for this patient.  Colorectal cancer screening: No longer required.   Lung Cancer Screening: (Low Dose CT Chest recommended if Age 31-80 years, 30  pack-year currently smoking OR have quit w/in 15years.) does not qualify.   Lung Cancer Screening Referral: No   Additional Screening:  Hepatitis C Screening: does not qualify; Completed yes  Vision Screening: Recommended annual ophthalmology exams for early detection of glaucoma and other disorders of the eye. Is the patient up to date with their annual eye exam?  Yes  Who is the provider or what is the name of the office in which the patient attends annual eye exams? Dr.Hecker  If pt is not established with a provider, would they like to be referred to a provider to establish care? No .   Dental Screening: Recommended annual dental exams for proper oral hygiene  Community Resource Referral / Chronic Care Management: CRR required this visit?  No   CCM required this visit?  No      Plan:     I have personally reviewed and noted the following in the patients chart:   Medical and social history Use of alcohol, tobacco or illicit drugs  Current medications and supplements including opioid prescriptions. Patient is not currently taking opioid prescriptions. Functional ability and status Nutritional status Physical activity Advanced directives List of other physicians Hospitalizations, surgeries, and ER visits in previous 12 months Vitals Screenings to include cognitive, depression, and falls Referrals and appointments  In addition, I have reviewed and discussed with patient certain preventive protocols, quality metrics, and best practice recommendations. A written personalized care plan for preventive services as well as  general preventive health recommendations were provided to patient.     Caesar Bookman, NP   03/02/2022   Nurse Notes: Type 2 diabetes,

## 2022-03-02 NOTE — Patient Instructions (Signed)
Mr. Borge , ?Thank you for taking time to come for your Medicare Wellness Visit. I appreciate your ongoing commitment to your health goals. Please review the following plan we discussed and let me know if I can assist you in the future.  ? ?Screening recommendations/referrals: ?Colonoscopy N/A ?Recommended yearly ophthalmology/optometry visit for glaucoma screening and checkup ?Recommended yearly dental visit for hygiene and checkup ? ?Vaccinations: ?Influenza vaccine : Up-to-date ?Pneumococcal vaccine : Up-to-date ?Tdap vaccine : Up-to-date ?Shingles vaccine :up-to-date ? ?Advanced directives: Yes ? ?Conditions/risks identified: Advanced age male greater than 55 years, type 2 diabetes, hypertension, dyslipidemia, history of smoking and  male gender ? ?Next appointment: 1 year ? ?Preventive Care 80 Years and Older, Male ?Preventive care refers to lifestyle choices and visits with your health care provider that can promote health and wellness. ?What does preventive care include? ?A yearly physical exam. This is also called an annual well check. ?Dental exams once or twice a year. ?Routine eye exams. Ask your health care provider how often you should have your eyes checked. ?Personal lifestyle choices, including: ?Daily care of your teeth and gums. ?Regular physical activity. ?Eating a healthy diet. ?Avoiding tobacco and drug use. ?Limiting alcohol use. ?Practicing safe sex. ?Taking low doses of aspirin every day. ?Taking vitamin and mineral supplements as recommended by your health care provider. ?What happens during an annual well check? ?The services and screenings done by your health care provider during your annual well check will depend on your age, overall health, lifestyle risk factors, and family history of disease. ?Counseling  ?Your health care provider may ask you questions about your: ?Alcohol use. ?Tobacco use. ?Drug use. ?Emotional well-being. ?Home and relationship well-being. ?Sexual  activity. ?Eating habits. ?History of falls. ?Memory and ability to understand (cognition). ?Work and work Astronomer. ?Screening  ?You may have the following tests or measurements: ?Height, weight, and BMI. ?Blood pressure. ?Lipid and cholesterol levels. These may be checked every 5 years, or more frequently if you are over 41 years old. ?Skin check. ?Lung cancer screening. You may have this screening every year starting at age 39 if you have a 30-pack-year history of smoking and currently smoke or have quit within the past 15 years. ?Fecal occult blood test (FOBT) of the stool. You may have this test every year starting at age 63. ?Flexible sigmoidoscopy or colonoscopy. You may have a sigmoidoscopy every 5 years or a colonoscopy every 10 years starting at age 58. ?Prostate cancer screening. Recommendations will vary depending on your family history and other risks. ?Hepatitis C blood test. ?Hepatitis B blood test. ?Sexually transmitted disease (STD) testing. ?Diabetes screening. This is done by checking your blood sugar (glucose) after you have not eaten for a while (fasting). You may have this done every 1-3 years. ?Abdominal aortic aneurysm (AAA) screening. You may need this if you are a current or former smoker. ?Osteoporosis. You may be screened starting at age 26 if you are at high risk. ?Talk with your health care provider about your test results, treatment options, and if necessary, the need for more tests. ?Vaccines  ?Your health care provider may recommend certain vaccines, such as: ?Influenza vaccine. This is recommended every year. ?Tetanus, diphtheria, and acellular pertussis (Tdap, Td) vaccine. You may need a Td booster every 10 years. ?Zoster vaccine. You may need this after age 83. ?Pneumococcal 13-valent conjugate (PCV13) vaccine. One dose is recommended after age 49. ?Pneumococcal polysaccharide (PPSV23) vaccine. One dose is recommended after age 20. ?Talk to your health  care provider about which  screenings and vaccines you need and how often you need them. ?This information is not intended to replace advice given to you by your health care provider. Make sure you discuss any questions you have with your health care provider. ?Document Released: 12/31/2015 Document Revised: 08/23/2016 Document Reviewed: 10/05/2015 ?Elsevier Interactive Patient Education ? 2017 [place]. ? ?Fall Prevention in the Home ?Falls can cause injuries. They can happen to people of all ages. There are many things you can do to make your home safe and to help prevent falls. ?What can I do on the outside of my home? ?Regularly fix the edges of walkways and driveways and fix any cracks. ?Remove anything that might make you trip as you walk through a door, such as a raised step or threshold. ?Trim any bushes or trees on the path to your home. ?Use bright outdoor lighting. ?Clear any walking paths of anything that might make someone trip, such as rocks or tools. ?Regularly check to see if handrails are loose or broken. Make sure that both sides of any steps have handrails. ?Any raised decks and porches should have guardrails on the edges. ?Have any leaves, snow, or ice cleared regularly. ?Use sand or salt on walking paths during winter. ?Clean up any spills in your garage right away. This includes oil or grease spills. ?What can I do in the bathroom? ?Use night lights. ?Install grab bars by the toilet and in the tub and shower. Do not use towel bars as grab bars. ?Use non-skid mats or decals in the tub or shower. ?If you need to sit down in the shower, use a plastic, non-slip stool. ?Keep the floor dry. Clean up any water that spills on the floor as soon as it happens. ?Remove soap buildup in the tub or shower regularly. ?Attach bath mats securely with double-sided non-slip rug tape. ?Do not have throw rugs and other things on the floor that can make you trip. ?What can I do in the bedroom? ?Use night lights. ?Make sure that you have a  light by your bed that is easy to reach. ?Do not use any sheets or blankets that are too big for your bed. They should not hang down onto the floor. ?Have a firm chair that has side arms. You can use this for support while you get dressed. ?Do not have throw rugs and other things on the floor that can make you trip. ?What can I do in the kitchen? ?Clean up any spills right away. ?Avoid walking on wet floors. ?Keep items that you use a lot in easy-to-reach places. ?If you need to reach something above you, use a strong step stool that has a grab bar. ?Keep electrical cords out of the way. ?Do not use floor polish or wax that makes floors slippery. If you must use wax, use non-skid floor wax. ?Do not have throw rugs and other things on the floor that can make you trip. ?What can I do with my stairs? ?Do not leave any items on the stairs. ?Make sure that there are handrails on both sides of the stairs and use them. Fix handrails that are broken or loose. Make sure that handrails are as long as the stairways. ?Check any carpeting to make sure that it is firmly attached to the stairs. Fix any carpet that is loose or worn. ?Avoid having throw rugs at the top or bottom of the stairs. If you do have throw rugs, attach them to  the floor with carpet tape. ?Make sure that you have a light switch at the top of the stairs and the bottom of the stairs. If you do not have them, ask someone to add them for you. ?What else can I do to help prevent falls? ?Wear shoes that: ?Do not have high heels. ?Have rubber bottoms. ?Are comfortable and fit you well. ?Are closed at the toe. Do not wear sandals. ?If you use a stepladder: ?Make sure that it is fully opened. Do not climb a closed stepladder. ?Make sure that both sides of the stepladder are locked into place. ?Ask someone to hold it for you, if possible. ?Clearly mark and make sure that you can see: ?Any grab bars or handrails. ?First and last steps. ?Where the edge of each step  is. ?Use tools that help you move around (mobility aids) if they are needed. These include: ?Canes. ?Walkers. ?Scooters. ?Crutches. ?Turn on the lights when you go into a dark area. Replace any light bulbs as soon as th

## 2022-05-08 ENCOUNTER — Other Ambulatory Visit: Payer: Self-pay | Admitting: Orthopedic Surgery

## 2022-06-26 ENCOUNTER — Other Ambulatory Visit: Payer: Medicare HMO

## 2022-06-26 DIAGNOSIS — I10 Essential (primary) hypertension: Secondary | ICD-10-CM

## 2022-06-26 DIAGNOSIS — E785 Hyperlipidemia, unspecified: Secondary | ICD-10-CM

## 2022-06-26 DIAGNOSIS — E1142 Type 2 diabetes mellitus with diabetic polyneuropathy: Secondary | ICD-10-CM

## 2022-06-26 DIAGNOSIS — E1169 Type 2 diabetes mellitus with other specified complication: Secondary | ICD-10-CM

## 2022-06-26 DIAGNOSIS — K219 Gastro-esophageal reflux disease without esophagitis: Secondary | ICD-10-CM

## 2022-06-27 ENCOUNTER — Other Ambulatory Visit: Payer: Self-pay | Admitting: Orthopedic Surgery

## 2022-06-27 DIAGNOSIS — R809 Proteinuria, unspecified: Secondary | ICD-10-CM

## 2022-06-27 LAB — LIPID PANEL
Cholesterol: 123 mg/dL (ref <200)
HDL: 53 mg/dL (ref >=40)
LDL Cholesterol (Calc): 55 mg/dL (calc)
Non-HDL Cholesterol (Calc): 70 mg/dL (calc) (ref <130)
Total CHOL/HDL Ratio: 2.3 (calc) (ref <5.0)
Triglycerides: 66 mg/dL (ref <150)

## 2022-06-27 LAB — COMPREHENSIVE METABOLIC PANEL
AG Ratio: 2 (calc) (ref 1.0–2.5)
ALT: 20 U/L (ref 9–46)
AST: 20 U/L (ref 10–35)
Albumin: 4.6 g/dL (ref 3.6–5.1)
Alkaline phosphatase (APISO): 72 U/L (ref 35–144)
BUN: 9 mg/dL (ref 7–25)
CO2: 30 mmol/L (ref 20–32)
Calcium: 9.7 mg/dL (ref 8.6–10.3)
Chloride: 103 mmol/L (ref 98–110)
Creat: 1.03 mg/dL (ref 0.70–1.28)
Globulin: 2.3 g/dL (calc) (ref 1.9–3.7)
Glucose, Bld: 97 mg/dL (ref 65–99)
Potassium: 4.6 mmol/L (ref 3.5–5.3)
Sodium: 141 mmol/L (ref 135–146)
Total Bilirubin: 0.5 mg/dL (ref 0.2–1.2)
Total Protein: 6.9 g/dL (ref 6.1–8.1)

## 2022-06-27 LAB — HEMOGLOBIN A1C
Hgb A1c MFr Bld: 5.7 % of total Hgb — ABNORMAL HIGH (ref <5.7)
Mean Plasma Glucose: 117 mg/dL
eAG (mmol/L): 6.5 mmol/L

## 2022-06-27 LAB — CBC WITH DIFFERENTIAL/PLATELET
Absolute Monocytes: 629 cells/uL (ref 200–950)
Basophils Absolute: 51 cells/uL (ref 0–200)
Basophils Relative: 0.6 %
Eosinophils Absolute: 179 cells/uL (ref 15–500)
Eosinophils Relative: 2.1 %
HCT: 46.5 % (ref 38.5–50.0)
Hemoglobin: 15.5 g/dL (ref 13.2–17.1)
Lymphs Abs: 2559 cells/uL (ref 850–3900)
MCH: 30.7 pg (ref 27.0–33.0)
MCHC: 33.3 g/dL (ref 32.0–36.0)
MCV: 92.1 fL (ref 80.0–100.0)
MPV: 10.9 fL (ref 7.5–12.5)
Monocytes Relative: 7.4 %
Neutro Abs: 5083 cells/uL (ref 1500–7800)
Neutrophils Relative %: 59.8 %
Platelets: 319 10*3/uL (ref 140–400)
RBC: 5.05 10*6/uL (ref 4.20–5.80)
RDW: 11.6 % (ref 11.0–15.0)
Total Lymphocyte: 30.1 %
WBC: 8.5 10*3/uL (ref 3.8–10.8)

## 2022-06-29 ENCOUNTER — Ambulatory Visit (INDEPENDENT_AMBULATORY_CARE_PROVIDER_SITE_OTHER): Payer: Medicare HMO | Admitting: Orthopedic Surgery

## 2022-06-29 ENCOUNTER — Encounter: Payer: Self-pay | Admitting: Orthopedic Surgery

## 2022-06-29 VITALS — BP 120/58 | HR 64 | Temp 97.6°F | Resp 18 | Ht 65.0 in | Wt 141.0 lb

## 2022-06-29 DIAGNOSIS — M25522 Pain in left elbow: Secondary | ICD-10-CM | POA: Diagnosis not present

## 2022-06-29 DIAGNOSIS — E1169 Type 2 diabetes mellitus with other specified complication: Secondary | ICD-10-CM

## 2022-06-29 DIAGNOSIS — M25512 Pain in left shoulder: Secondary | ICD-10-CM

## 2022-06-29 DIAGNOSIS — R7303 Prediabetes: Secondary | ICD-10-CM | POA: Diagnosis not present

## 2022-06-29 DIAGNOSIS — N529 Male erectile dysfunction, unspecified: Secondary | ICD-10-CM | POA: Diagnosis not present

## 2022-06-29 DIAGNOSIS — G8929 Other chronic pain: Secondary | ICD-10-CM

## 2022-06-29 DIAGNOSIS — I10 Essential (primary) hypertension: Secondary | ICD-10-CM

## 2022-06-29 DIAGNOSIS — K219 Gastro-esophageal reflux disease without esophagitis: Secondary | ICD-10-CM

## 2022-06-29 DIAGNOSIS — M25511 Pain in right shoulder: Secondary | ICD-10-CM | POA: Diagnosis not present

## 2022-06-29 DIAGNOSIS — E785 Hyperlipidemia, unspecified: Secondary | ICD-10-CM

## 2022-06-29 MED ORDER — SILDENAFIL CITRATE 50 MG PO TABS: ORAL_TABLET | ORAL | 5 refills | Status: DC

## 2022-06-29 NOTE — Progress Notes (Signed)
Careteam: Patient Care Team: Octavia Heir, NP as PCP - General (Adult Health Nurse Practitioner) [name], MD as Consulting Physician (Ophthalmology) [name], MD as Consulting Physician (Gastroenterology)  Seen by: [name], AGNP-C  PLACE OF SERVICE:  [facility]  Advanced Directive information Does Patient Have a Medical Advance Directive?: No, Would patient like information on creating a medical advance directive?: No - Patient declined  Allergies  Allergen Reactions   Penicillins Swelling   Shellfish Allergy Swelling    Chief Complaint  Patient presents with   Medical Management of Chronic Issues    Patient is here for a 72M follow up for DM II, and other chronic conditions with lab review, patient also needs foot exam      HPI: Patient is a 81 y.o. male seen today for medical management of chronic conditions.   Labs discussed with patient.   Requesting refill on viagra.   Still remains prediabetic, A1c 5.7. Foot exam done today. Eye exam 02/08/2022. Still watching carbs and sugars in diet.   Still having issues with neck/shoulder pain. Pain radiates to both arms. He has intermittent numbness/tingling radiating doen both arms. He was given gabapentin a few months back, but only took a few days. He has not seen Dr. Ranell Patrick in > 3 months.   No recent falls or injuries.   Sleeping well.   Discussed covid booster/flu vaccine this fall.     Review of Systems:  Review of Systems  Constitutional: Negative.   HENT: Negative.    Eyes: Negative.   Respiratory:  Negative for cough, shortness of breath and wheezing.   Cardiovascular:  Negative for chest pain and leg swelling.  Gastrointestinal: Negative.   Genitourinary: Negative.   Musculoskeletal: Negative.   Skin: Negative.   Neurological:  Positive for tingling and sensory change.  Endo/Heme/Allergies: Negative.   Psychiatric/Behavioral:  Negative for depression and memory loss. The patient is not  nervous/anxious and does not have insomnia.     Past Medical History:  Diagnosis Date   Abdominal pain, other specified site    Backache, unspecified    Carpal tunnel syndrome    Diabetes mellitus without complication (HCC)    Essential hypertension, benign    H/O hand surgery 02/25/2020   R hand    Hyperlipidemia    Impacted cerumen    Impaired fasting glucose    Other abnormal blood chemistry    Pain in joint, lower leg    Special screening for malignant neoplasm of prostate    Unspecified essential hypertension    Unspecified hypothyroidism    Past Surgical History:  Procedure Laterality Date   (L) KNEE SURGERY"TORN MENISCUS"     EYE SURGERY Left 11/24/2014   cataracts   Social History:   reports that he quit smoking about 56 years ago. His smoking use included cigarettes. He has never used smokeless tobacco. He reports that he does not drink alcohol and does not use drugs.  Family History  Problem Relation Age of Onset   Alzheimer's disease Sister    Cancer Brother 2       lung   Diabetes Sister    Diabetes Sister    Lung cancer Sister 52       mets to back   Kidney disease Sister    Allergies Sister    Obesity Sister    Cancer Mother     Medications: Patient's Medications  New Prescriptions   No medications on file  Previous Medications  ASCORBIC ACID (VITAMIN C) 1000 MG TABLET    Take 1,000 mg by mouth 2 (two) times daily.   ASPIRIN 81 MG TABLET    Take 81 mg by mouth daily.   ASPIRIN-SALICYLAMIDE-CAFFEINE (BC HEADACHE POWDER PO)    Take by mouth as needed.   ATORVASTATIN (LIPITOR) 40 MG TABLET    TAKE 1 TABLET DAILY TO LOWER CHOLESTEROL   B COMPLEX VITAMINS CAPSULE    Take 1 capsule by mouth daily.   LATANOPROST (XALATAN) 0.005 % OPHTHALMIC SOLUTION    Place 1 drop into both eyes at bedtime.   LISINOPRIL-HYDROCHLOROTHIAZIDE (ZESTORETIC) 20-12.5 MG TABLET    TAKE 1 TABLET DAILY   METFORMIN (GLUCOPHAGE) 500 MG TABLET    TAKE 1 TABLET IN THE MORNING AND  TAKE ONE-HALF (1/2) TABLET IN THE EVENING   MULTIPLE VITAMINS-MINERALS (MULTIVITAMIN & MINERAL PO)    Take 1 tablet by mouth daily.   OMEPRAZOLE (PRILOSEC) 20 MG CAPSULE    Take 20 mg by mouth as needed.   VITAMIN B-12 (CYANOCOBALAMIN) 1000 MCG TABLET    Take 500 mcg by mouth daily.   Modified Medications   Modified Medication Previous Medication   SILDENAFIL (VIAGRA) 50 MG TABLET sildenafil (VIAGRA) 50 MG tablet      TAKE 1 TABLET AS NEEDED FOR ERECTILE DYSFUNCTION    TAKE 1 TABLET AS NEEDED FOR ERECTILE DYSFUNCTION  Discontinued Medications   No medications on file    Physical Exam:  Vitals:   06/29/22 1047  BP: (!) 120/58  Pulse: 64  Resp: 18  Temp: 97.6 F (36.4 C)  TempSrc: Temporal  SpO2: 99%  Weight: 141 lb (64 kg)  Height: 5\' 5"  (1.651 m)   Body mass index is 23.46 kg/m. Wt Readings from Last 3 Encounters:  06/29/22 141 lb (64 kg)  03/02/22 140 lb 11.2 oz (63.8 kg)  02/09/22 140 lb 12.8 oz (63.9 kg)    Physical Exam Vitals reviewed.  Constitutional:      General: He is not in acute distress. HENT:     Head: Normocephalic.  Eyes:     General:        Right eye: No discharge.        Left eye: No discharge.  Cardiovascular:     Rate and Rhythm: Normal rate and regular rhythm.     Pulses:          Dorsalis pedis pulses are 1+ on the right side and 1+ on the left side.       Posterior tibial pulses are 1+ on the right side and 1+ on the left side.     Heart sounds: Normal heart sounds. No murmur heard. Pulmonary:     Effort: Pulmonary effort is normal. No respiratory distress.     Breath sounds: Normal breath sounds. No wheezing.  Abdominal:     General: Bowel sounds are normal. There is no distension.     Palpations: Abdomen is soft.     Tenderness: There is no abdominal tenderness.  Musculoskeletal:     Cervical back: Neck supple.     Right lower leg: No edema.     Left lower leg: No edema.     Right foot: Normal range of motion.     Left foot: Normal  range of motion.     Comments: upper extremities FROM and sensation, no apparent injury  Feet:     Right foot:     Protective Sensation: 10 sites tested.  10 sites sensed.  Toenail Condition: Right toenails are abnormally thick. Fungal disease present.    Left foot:     Protective Sensation: 10 sites tested.  10 sites sensed.     Toenail Condition: Left toenails are abnormally thick. Fungal disease present. Skin:    General: Skin is warm and dry.     Capillary Refill: Capillary refill takes less than 2 seconds.  Neurological:     General: No focal deficit present.     Mental Status: He is alert and oriented to person, place, and time.     Motor: No weakness.     Gait: Gait normal.  Psychiatric:        Mood and Affect: Mood normal.        Behavior: Behavior normal.     Labs reviewed: Basic Metabolic Panel: Recent Labs    12/13/21 0909 06/26/22 0837  NA 139 141  K 4.1 4.6  CL 102 103  CO2 28 30  GLUCOSE 86 97  BUN 8 9  CREATININE 0.94 1.03  CALCIUM 9.3 9.7   Liver Function Tests: Recent Labs    12/13/21 0909 06/26/22 0837  AST 17 20  ALT 18 20  BILITOT 0.6 0.5  PROT 6.7 6.9   No results for input(s): "LIPASE", "AMYLASE" in the last 8760 hours. No results for input(s): "AMMONIA" in the last 8760 hours. CBC: Recent Labs    12/13/21 0909 06/26/22 0837  WBC 9.9 8.5  NEUTROABS 5,792 5,083  HGB 15.1 15.5  HCT 44.9 46.5  MCV 91.4 92.1  PLT 355 319   Lipid Panel: Recent Labs    06/26/22 0837  CHOL 123  HDL 53  LDLCALC 55  TRIG 66  CHOLHDL 2.3   TSH: No results for input(s): "TSH" in the last 8760 hours. A1C: Lab Results  Component Value Date   HGBA1C 5.7 (H) 06/26/2022     Assessment/Plan 1. Erectile dysfunction, unspecified erectile dysfunction type - sildenafil (VIAGRA) 50 MG tablet; TAKE 1 TABLET AS NEEDED FOR ERECTILE DYSFUNCTION  Dispense: 30 tablet; Refill: 5  2. Prediabetes - A1c 5.7 06/26/2022 - on metformin - cont asa and  lisinopril-HCTZ - yearly eye exam complete - foot exam normal today  3. Chronic pain of both shoulders - ongoing - rotator cuff involvement - radiation down arms - advised to try gabapentin for 14 days - advised to f/u with Dr. Ranell Patrick  4. Left elbow pain - see above  5. Essential hypertension, benign - controlled - cont lisinopril-HCTZ  6. Gastroesophageal reflux disease without esophagitis - hgb stable - cont omeprazole  7. Hyperlipidemia associated with T2DM - LDL at goal - cont statin  Total time: 25 minutes. Greater than 50% of total time spent doing patient education regarding health maintenance, diabetes, arm pain, symptom/medication management.    Next appt: Visit date not found  Burnetta Kohls Scherry Ran  [facility] [phone]

## 2022-06-29 NOTE — Patient Instructions (Signed)
Try taking gabapentin for 1-2 weeks consistently to see if it helps with numbness/tingling.

## 2022-07-04 ENCOUNTER — Other Ambulatory Visit: Payer: Self-pay

## 2022-07-04 DIAGNOSIS — N529 Male erectile dysfunction, unspecified: Secondary | ICD-10-CM

## 2022-07-04 MED ORDER — SILDENAFIL CITRATE 50 MG PO TABS: ORAL_TABLET | ORAL | 5 refills | Status: DC

## 2022-07-04 NOTE — Telephone Encounter (Signed)
Patient called stating prescription was sent to [facility] instead of [facility]. Generic viagra. He says he canceled it at [facility].  Marchelle Folks at [facility] states there are no prescriptions at any [facility] for generic viagra for the patient, although I do show Rx sent on the 13th.   To Amy to approve prescription to [facility].

## 2022-07-17 ENCOUNTER — Other Ambulatory Visit: Payer: Self-pay | Admitting: Orthopedic Surgery

## 2022-07-17 ENCOUNTER — Other Ambulatory Visit: Payer: Self-pay | Admitting: *Deleted

## 2022-07-17 DIAGNOSIS — N529 Male erectile dysfunction, unspecified: Secondary | ICD-10-CM

## 2022-07-17 MED ORDER — SILDENAFIL CITRATE 50 MG PO TABS: ORAL_TABLET | ORAL | 5 refills | Status: DC

## 2022-07-17 NOTE — Telephone Encounter (Signed)
Patient called and stated that he does not want his Rx for Viagra sent to [facility], he wants it to go to [facility] because it is cheaper.

## 2022-08-11 ENCOUNTER — Other Ambulatory Visit: Payer: Self-pay | Admitting: Orthopedic Surgery

## 2022-08-11 DIAGNOSIS — R0789 Other chest pain: Secondary | ICD-10-CM

## 2022-08-11 DIAGNOSIS — M25512 Pain in left shoulder: Secondary | ICD-10-CM

## 2022-08-14 ENCOUNTER — Telehealth: Payer: Self-pay

## 2022-08-14 ENCOUNTER — Other Ambulatory Visit: Payer: Self-pay | Admitting: Orthopedic Surgery

## 2022-08-14 DIAGNOSIS — G8929 Other chronic pain: Secondary | ICD-10-CM

## 2022-08-14 MED ORDER — GABAPENTIN 100 MG PO CAPS: 100.0000 mg | ORAL_CAPSULE | Freq: Two times a day (BID) | ORAL | 1 refills | Status: DC

## 2022-08-14 NOTE — Telephone Encounter (Signed)
Patient called requesting refill on gabapentin 100 mg. He received medication 02/09/22.   Message routed to [name], NP

## 2022-08-14 NOTE — Telephone Encounter (Signed)
Gabapentin refilled. If shoulder pain persists, recommend scheduling follow up with Dr. Ranell Patrick.

## 2022-08-17 NOTE — Telephone Encounter (Signed)
Spoke with patient's wife and she was informed to follow up with Dr. Ranell Patrick if pain persists.

## 2022-09-18 ENCOUNTER — Other Ambulatory Visit: Payer: Self-pay | Admitting: Orthopedic Surgery

## 2022-09-18 DIAGNOSIS — G8929 Other chronic pain: Secondary | ICD-10-CM

## 2022-09-25 ENCOUNTER — Ambulatory Visit
Admission: RE | Admit: 2022-09-25 | Discharge: 2022-09-25 | Disposition: A | Discharge status: Home / Self Care | Payer: Medicare HMO | Source: Ambulatory Visit | Attending: Orthopedic Surgery | Admitting: Orthopedic Surgery

## 2022-09-25 DIAGNOSIS — G8929 Other chronic pain: Secondary | ICD-10-CM

## 2022-09-25 MED ORDER — MEPERIDINE HCL 50 MG/ML IJ SOLN: 50.0000 mg | Freq: Once | INTRAMUSCULAR | Status: DC | PRN

## 2022-09-25 MED ORDER — ONDANSETRON HCL 4 MG/2ML IJ SOLN: 4.0000 mg | Freq: Once | INTRAMUSCULAR | Status: DC | PRN

## 2022-09-25 MED ORDER — DIAZEPAM 5 MG PO TABS: 5.0000 mg | ORAL_TABLET | Freq: Once | ORAL | Status: DC

## 2022-09-25 NOTE — Discharge Instructions (Signed)

## 2022-09-26 ENCOUNTER — Other Ambulatory Visit: Payer: Self-pay | Admitting: Physician Assistant

## 2022-09-26 DIAGNOSIS — M47812 Spondylosis without myelopathy or radiculopathy, cervical region: Secondary | ICD-10-CM

## 2022-10-03 ENCOUNTER — Telehealth: Payer: Self-pay

## 2022-10-03 ENCOUNTER — Ambulatory Visit
Admission: RE | Admit: 2022-10-03 | Discharge: 2022-10-03 | Disposition: A | Discharge status: Home / Self Care | Payer: Medicare HMO | Source: Ambulatory Visit | Attending: Physician Assistant | Admitting: Physician Assistant

## 2022-10-03 DIAGNOSIS — M47812 Spondylosis without myelopathy or radiculopathy, cervical region: Secondary | ICD-10-CM

## 2022-10-03 MED ORDER — IOPAMIDOL (ISOVUE-M 300) INJECTION 61%
10.0000 mL | Freq: Once | INTRAMUSCULAR | Status: AC
  Administered 2022-10-03: 10 mL via INTRATHECAL

## 2022-10-03 MED ORDER — ONDANSETRON HCL 4 MG/2ML IJ SOLN: 4.0000 mg | Freq: Once | INTRAMUSCULAR | Status: DC | PRN

## 2022-10-03 MED ORDER — MEPERIDINE HCL 50 MG/ML IJ SOLN: 50.0000 mg | Freq: Once | INTRAMUSCULAR | Status: DC | PRN

## 2022-10-03 MED ORDER — DIAZEPAM 5 MG PO TABS: 5.0000 mg | ORAL_TABLET | Freq: Once | ORAL | Status: DC

## 2022-10-03 NOTE — Discharge Instructions (Signed)

## 2022-10-03 NOTE — Telephone Encounter (Signed)
Pt called into [facility] complaining of blurred vision and a "line in my eye" after going home from myelogram procedure. Pt called into our office around 1400 reporting the blurred vision had just started as well as a "line in his eye". The patient denied any itching, swelling, feeling faint, dizziness, pt reported his upper and lower extremities were equal in strength. The pts wife denied any drooping of his face and the patients speech did not appear slurred. The patient also took his BP which he reported to be 120s/70s with a heart rate in the 70s. Dr. Maree Erie was notified of the patients complaints and he suggested the patient seek emergency medical attention. I relayed this to the patient and his wife at 26. The patient stated his blurred vision was starting to clear up and the "line in the eye" was gone. The patient stated he was "starting to feel better" and was not going to the emergency room at this time but would go if the symptoms started to appear again.

## 2022-10-17 ENCOUNTER — Ambulatory Visit (INDEPENDENT_AMBULATORY_CARE_PROVIDER_SITE_OTHER): Payer: Medicare HMO

## 2022-10-17 DIAGNOSIS — Z23 Encounter for immunization: Secondary | ICD-10-CM | POA: Diagnosis not present

## 2022-10-19 ENCOUNTER — Ambulatory Visit (INDEPENDENT_AMBULATORY_CARE_PROVIDER_SITE_OTHER): Payer: Medicare HMO | Admitting: Orthopedic Surgery

## 2022-10-19 ENCOUNTER — Encounter: Payer: Self-pay | Admitting: Orthopedic Surgery

## 2022-10-19 VITALS — BP 122/78 | HR 70 | Temp 97.7°F | Ht 65.0 in | Wt 139.0 lb

## 2022-10-19 DIAGNOSIS — L602 Onychogryphosis: Secondary | ICD-10-CM

## 2022-10-19 DIAGNOSIS — B351 Tinea unguium: Secondary | ICD-10-CM

## 2022-10-19 NOTE — Progress Notes (Signed)
Careteam: Patient Care Team: Octavia Heir, NP as PCP - General (Adult Health Nurse Practitioner) [name], MD as Consulting Physician (Ophthalmology) [name], MD as Consulting Physician (Gastroenterology)  Seen by: [name], AGNP-C  PLACE OF SERVICE:  [facility]  Advanced Directive information    Allergies  Allergen Reactions   Penicillins Swelling   Shellfish Allergy Swelling    Chief Complaint  Patient presents with   Acute Visit    Patient presents today for bilateral toe fungus for about a month now.      HPI: Patient is a 81 y.o. male see today for acute visit due to toenail fungus.   He noticed thicker toenails and toenail discoloration > 1 month. He reports wearing clean socks and washing feet daily. He cuts his own toenails with clippers. He has tried Radio broadcast assistant. Treatment options discussed with patient. He would like to see podiatrist.   Review of Systems:  Review of Systems  Constitutional: Negative.   HENT: Negative.    Eyes: Negative.   Respiratory: Negative.    Cardiovascular: Negative.   Gastrointestinal: Negative.   Musculoskeletal: Negative.   Skin:        Thick toenails/discoloration  Neurological: Negative.   Endo/Heme/Allergies: Negative.   Psychiatric/Behavioral: Negative.      Past Medical History:  Diagnosis Date   Abdominal pain, other specified site    Backache, unspecified    Carpal tunnel syndrome    Diabetes mellitus without complication (HCC)    Essential hypertension, benign    H/O hand surgery 02/25/2020   R hand    Hyperlipidemia    Impacted cerumen    Impaired fasting glucose    Other abnormal blood chemistry    Pain in joint, lower leg    Special screening for malignant neoplasm of prostate    Unspecified essential hypertension    Unspecified hypothyroidism    Past Surgical History:  Procedure Laterality Date   (L) KNEE SURGERY"TORN MENISCUS"     EYE SURGERY Left 11/24/2014    cataracts   Social History:   reports that he quit smoking about 56 years ago. His smoking use included cigarettes. He has never used smokeless tobacco. He reports that he does not drink alcohol and does not use drugs.  Family History  Problem Relation Age of Onset   Alzheimer's disease Sister    Cancer Brother 29       lung   Diabetes Sister    Diabetes Sister    Lung cancer Sister 51       mets to back   Kidney disease Sister    Allergies Sister    Obesity Sister    Cancer Mother     Medications: Patient's Medications  New Prescriptions   No medications on file  Previous Medications   ASCORBIC ACID (VITAMIN C) 1000 MG TABLET    Take 1,000 mg by mouth 2 (two) times daily.   ASPIRIN 81 MG TABLET    Take 81 mg by mouth daily.   ASPIRIN-SALICYLAMIDE-CAFFEINE (BC HEADACHE POWDER PO)    Take by mouth as needed.   ATORVASTATIN (LIPITOR) 40 MG TABLET    TAKE 1 TABLET DAILY TO LOWER CHOLESTEROL   B COMPLEX VITAMINS CAPSULE    Take 1 capsule by mouth daily.   GABAPENTIN (NEURONTIN) 100 MG CAPSULE    Take 1 capsule (100 mg total) by mouth 2 (two) times daily.   LATANOPROST (XALATAN) 0.005 % OPHTHALMIC SOLUTION  Place 1 drop into both eyes at bedtime.   LISINOPRIL-HYDROCHLOROTHIAZIDE (ZESTORETIC) 20-12.5 MG TABLET    TAKE 1 TABLET DAILY   METFORMIN (GLUCOPHAGE) 500 MG TABLET    TAKE 1 TABLET IN THE MORNING AND ONE-HALF (1/2) TABLET IN THE EVENING   MULTIPLE VITAMINS-MINERALS (MULTIVITAMIN & MINERAL PO)    Take 1 tablet by mouth daily.   OMEPRAZOLE (PRILOSEC) 20 MG CAPSULE    Take 20 mg by mouth as needed.   SILDENAFIL (VIAGRA) 50 MG TABLET    TAKE 1 TABLET AS NEEDED FOR ERECTILE DYSFUNCTION   VITAMIN B-12 (CYANOCOBALAMIN) 1000 MCG TABLET    Take 500 mcg by mouth daily.   Modified Medications   No medications on file  Discontinued Medications   No medications on file    Physical Exam:  Vitals:   10/19/22 1104  BP: 122/78  Pulse: 70  Temp: 97.7 F (36.5 C)  SpO2: 97%   Weight: 139 lb (63 kg)  Height: 5\' 5"  (1.651 m)   Body mass index is 23.13 kg/m. Wt Readings from Last 3 Encounters:  10/19/22 139 lb (63 kg)  06/29/22 141 lb (64 kg)  03/02/22 140 lb 11.2 oz (63.8 kg)    Physical Exam Vitals reviewed.  Constitutional:      General: He is not in acute distress. HENT:     Head: Normocephalic.  Eyes:     General:        Right eye: No discharge.        Left eye: No discharge.  Cardiovascular:     Rate and Rhythm: Normal rate and regular rhythm.     Pulses:          Dorsalis pedis pulses are 1+ on the right side and 1+ on the left side.       Posterior tibial pulses are 1+ on the right side and 1+ on the left side.     Heart sounds: Normal heart sounds.  Pulmonary:     Effort: Pulmonary effort is normal.     Breath sounds: Normal breath sounds.  Musculoskeletal:     Right lower leg: No edema.     Left lower leg: No edema.  Feet:     Right foot:     Protective Sensation: 10 sites tested.  10 sites sensed.     Skin integrity: Skin integrity normal.     Toenail Condition: Right toenails are abnormally thick. Fungal disease present.    Left foot:     Protective Sensation: 10 sites tested.  10 sites sensed.     Skin integrity: Skin integrity normal.     Toenail Condition: Left toenails are abnormally thick. Fungal disease present.    Comments: Right great toe/ 3rd/4th toes with increased thickness/discoloration Skin:    General: Skin is warm and dry.     Capillary Refill: Capillary refill takes less than 2 seconds.  Neurological:     General: No focal deficit present.     Mental Status: He is alert and oriented to person, place, and time.  Psychiatric:        Mood and Affect: Mood normal.        Behavior: Behavior normal.     Labs reviewed: Basic Metabolic Panel: Recent Labs    12/13/21 0909 06/26/22 0837  NA 139 141  K 4.1 4.6  CL 102 103  CO2 28 30  GLUCOSE 86 97  BUN 8 9  CREATININE 0.94 1.03  CALCIUM 9.3 9.7   Liver  Function Tests: Recent Labs    12/13/21 0909 06/26/22 0837  AST 17 20  ALT 18 20  BILITOT 0.6 0.5  PROT 6.7 6.9   No results for input(s): "LIPASE", "AMYLASE" in the last 8760 hours. No results for input(s): "AMMONIA" in the last 8760 hours. CBC: Recent Labs    12/13/21 0909 06/26/22 0837  WBC 9.9 8.5  NEUTROABS 5,792 5,083  HGB 15.1 15.5  HCT 44.9 46.5  MCV 91.4 92.1  PLT 355 319   Lipid Panel: Recent Labs    06/26/22 0837  CHOL 123  HDL 53  LDLCALC 55  TRIG 66  CHOLHDL 2.3   TSH: No results for input(s): "TSH" in the last 8760 hours. A1C: Lab Results  Component Value Date   HGBA1C 5.7 (H) 06/26/2022     Assessment/Plan 1. Onychomycosis - involves right great toe/ 3rd/4th toes  - unsuccessful trail of Kerasal - Ambulatory referral to Podiatry  2. Onychauxis - see above  Total time: 15 minutes. Greater than 50% of total time spent doing patient education regarding toenail thickness/discoloration.     Next appt: 01/04/2023  Windell Moulding, [facility] [phone]

## 2022-10-27 ENCOUNTER — Ambulatory Visit: Payer: Medicare HMO | Admitting: Podiatry

## 2022-10-27 DIAGNOSIS — M79675 Pain in left toe(s): Secondary | ICD-10-CM | POA: Diagnosis not present

## 2022-10-27 DIAGNOSIS — B351 Tinea unguium: Secondary | ICD-10-CM

## 2022-10-27 DIAGNOSIS — Z79899 Other long term (current) drug therapy: Secondary | ICD-10-CM | POA: Diagnosis not present

## 2022-10-27 DIAGNOSIS — M79674 Pain in right toe(s): Secondary | ICD-10-CM

## 2022-10-27 MED ORDER — TERBINAFINE HCL 250 MG PO TABS: 250.0000 mg | ORAL_TABLET | Freq: Every day | ORAL | 0 refills | Status: DC

## 2022-10-27 NOTE — Patient Instructions (Addendum)
Get blood work done in 4 weeks. If you have any issues with the medication, stop taking it and let me know. Have a nice weekend  ----  Terbinafine Tablets What is this medication? TERBINAFINE (TER bin a feen) treats fungal infections of the nails. It belongs to a group of medications called antifungals. It will not treat infections caused by bacteria or viruses. This medicine may be used for other purposes; ask your health care provider or pharmacist if you have questions. COMMON BRAND NAME(S): Lamisil, Terbinex What should I tell my care team before I take this medication? They need to know if you have any of these conditions: Liver disease An unusual or allergic reaction to terbinafine, other medications, foods, dyes, or preservatives Pregnant or trying to get pregnant Breast-feeding How should I use this medication? Take this medication by mouth with water. Take it as directed on the prescription label at the same time every day. You can take it with or without food. If it upsets your stomach, take it with food. Keep taking it unless your care team tells you to stop. A special MedGuide will be given to you by the pharmacist with each prescription and refill. Be sure to read this information carefully each time. Talk to your care team regarding the use of this medication in children. Special care may be needed. Overdosage: If you think you have taken too much of this medicine contact a poison control center or emergency room at once. NOTE: This medicine is only for you. Do not share this medicine with others. What if I miss a dose? If you miss a dose, take it as soon as you can unless it is more than 4 hours late. If it is more than 4 hours late, skip the missed dose. Take the next dose at the normal time. What may interact with this medication? Do not take this medication with any of the following: Pimozide Thioridazine This medication may also interact with the following: Beta  blockers Caffeine Certain medications for mental health conditions Cimetidine Cyclosporine Medications for fungal infections like fluconazole and ketoconazole Medications for irregular heartbeat like amiodarone, flecainide and propafenone Rifampin Warfarin This list may not describe all possible interactions. Give your health care provider a list of all the medicines, herbs, non-prescription drugs, or dietary supplements you use. Also tell them if you smoke, drink alcohol, or use illegal drugs. Some items may interact with your medicine. What should I watch for while using this medication? Visit your care team for regular checks on your progress. You may need blood work while you are taking this medication. It may be some time before you see the benefit from this medication. This medication may cause serious skin reactions. They can happen weeks to months after starting the medication. Contact your care team right away if you notice fevers or flu-like symptoms with a rash. The rash may be red or purple and then turn into blisters or peeling of the skin. Or, you might notice a red rash with swelling of the face, lips or lymph nodes in your neck or under your arms. This medication can make you more sensitive to the sun. Keep out of the sun, If you cannot avoid being in the sun, wear protective clothing and sunscreen. Do not use sun lamps or tanning beds/booths. What side effects may I notice from receiving this medication? Side effects that you should report to your care team as soon as possible: Allergic reactions--skin rash, itching, hives, swelling of  the face, lips, tongue, or throat Change in sense of smell Change in taste Infection--fever, chills, cough, or sore throat Liver injury--right upper belly pain, loss of appetite, nausea, light-colored stool, dark yellow or brown urine, yellowing skin or eyes, unusual weakness or fatigue Low red blood cell level--unusual weakness or fatigue,  dizziness, headache, trouble breathing Lupus-like syndrome--joint pain, swelling, or stiffness, butterfly-shaped rash on the face, rashes that get worse in the sun, fever, unusual weakness or fatigue Rash, fever, and swollen lymph nodes Redness, blistering, peeling, or loosening of the skin, including inside the mouth Unusual bruising or bleeding Worsening mood, feelings of depression Side effects that usually do not require medical attention (report to your care team if they continue or are bothersome): Diarrhea Gas Headache Nausea Stomach pain Upset stomach This list may not describe all possible side effects. Call your doctor for medical advice about side effects. You may report side effects to FDA at 1-800-FDA-1088. Where should I keep my medication? Keep out of the reach of children and pets. Store between 20 and 25 degrees C (68 and 77 degrees F). Protect from light. Get rid of any unused medication after the expiration date. To get rid of medications that are no longer needed or have expired: Take the medication to a medication take-back program. Check with your pharmacy or law enforcement to find a location. If you cannot return the medication, check the label or package insert to see if the medication should be thrown out in the garbage or flushed down the toilet. If you are not sure, ask your care team. If it is safe to put it in the trash, take the medication out of the container. Mix the medication with cat litter, dirt, coffee grounds, or other unwanted substance. Seal the mixture in a bag or container. Put it in the trash. NOTE: This sheet is a summary. It may not cover all possible information. If you have questions about this medicine, talk to your doctor, pharmacist, or health care provider.  2023 Elsevier/Gold Standard (2021-06-28 00:00:00)

## 2022-10-27 NOTE — Progress Notes (Unsigned)
   Established Patient Office Visit  Subjective   Patient ID: Tyler Hull, male    DOB: 03/24/1941  Age: 81 y.o. MRN: [id number]  Chief Complaint  Patient presents with   Nail Problem    Nail fungus, started month ago, patient states that  hallux nail are sore    Using kerasyl  A1c 5.7 on 7/10/203  HPI  {History (Optional):23778}  ROS    Objective:     There were no vitals taken for this visit. {Vitals History (Optional):23777}  Physical Exam   No results found for any visits on 10/27/22.  {Labs (Optional):23779}  The ASCVD Risk score (Arnett DK, et al., 2019) failed to calculate for the following reasons:   The 2019 ASCVD risk score is only valid for ages 67 to 23    Assessment & Plan:   Problem List Items Addressed This Visit   None   No follow-ups on file.    Vivi Barrack, DPM

## 2022-11-02 ENCOUNTER — Ambulatory Visit: Payer: Medicare HMO | Admitting: Podiatry

## 2022-11-25 LAB — HEPATIC FUNCTION PANEL
ALT: 19 IU/L (ref 0–44)
AST: 20 IU/L (ref 0–40)
Albumin: 4.7 g/dL (ref 3.8–4.8)
Alkaline Phosphatase: 80 IU/L (ref 44–121)
Bilirubin Total: 0.6 mg/dL (ref 0.0–1.2)
Bilirubin, Direct: 0.18 mg/dL (ref 0.00–0.40)
Total Protein: 7 g/dL (ref 6.0–8.5)

## 2022-11-25 LAB — CBC WITH DIFFERENTIAL/PLATELET
Basophils Absolute: 0.1 10*3/uL (ref 0.0–0.2)
Basos: 1 %
EOS (ABSOLUTE): 0.3 10*3/uL (ref 0.0–0.4)
Eos: 3 %
Hematocrit: 42.5 % (ref 37.5–51.0)
Hemoglobin: 14.7 g/dL (ref 13.0–17.7)
Immature Grans (Abs): 0 10*3/uL (ref 0.0–0.1)
Immature Granulocytes: 0 %
Lymphocytes Absolute: 2.4 10*3/uL (ref 0.7–3.1)
Lymphs: 21 %
MCH: 31.2 pg (ref 26.6–33.0)
MCHC: 34.6 g/dL (ref 31.5–35.7)
MCV: 90 fL (ref 79–97)
Monocytes Absolute: 0.9 10*3/uL (ref 0.1–0.9)
Monocytes: 8 %
Neutrophils Absolute: 7.6 10*3/uL — ABNORMAL HIGH (ref 1.4–7.0)
Neutrophils: 67 %
Platelets: 331 10*3/uL (ref 150–450)
RBC: 4.71 x10E6/uL (ref 4.14–5.80)
RDW: 11.6 % (ref 11.6–15.4)
WBC: 11.3 10*3/uL — ABNORMAL HIGH (ref 3.4–10.8)

## 2022-12-25 ENCOUNTER — Other Ambulatory Visit: Payer: Self-pay | Admitting: Orthopedic Surgery

## 2022-12-25 DIAGNOSIS — E1129 Type 2 diabetes mellitus with other diabetic kidney complication: Secondary | ICD-10-CM

## 2022-12-25 HISTORY — PX: OTHER SURGICAL HISTORY: SHX169

## 2022-12-25 HISTORY — PX: DG HAND RIGHT COMPLETE (ARMC HX): HXRAD1530

## 2023-01-01 ENCOUNTER — Other Ambulatory Visit: Payer: Medicare HMO

## 2023-01-01 ENCOUNTER — Other Ambulatory Visit: Payer: Self-pay

## 2023-01-01 DIAGNOSIS — R7303 Prediabetes: Secondary | ICD-10-CM

## 2023-01-01 DIAGNOSIS — E1169 Type 2 diabetes mellitus with other specified complication: Secondary | ICD-10-CM

## 2023-01-01 DIAGNOSIS — I10 Essential (primary) hypertension: Secondary | ICD-10-CM

## 2023-01-02 LAB — CBC WITH DIFFERENTIAL/PLATELET
Absolute Monocytes: 677 cells/uL (ref 200–950)
Basophils Absolute: 56 cells/uL (ref 0–200)
Basophils Relative: 0.6 %
Eosinophils Absolute: 150 cells/uL (ref 15–500)
Eosinophils Relative: 1.6 %
HCT: 44 % (ref 38.5–50.0)
Hemoglobin: 14.8 g/dL (ref 13.2–17.1)
Lymphs Abs: 2284 cells/uL (ref 850–3900)
MCH: 30.3 pg (ref 27.0–33.0)
MCHC: 33.6 g/dL (ref 32.0–36.0)
MCV: 90.2 fL (ref 80.0–100.0)
MPV: 10.8 fL (ref 7.5–12.5)
Monocytes Relative: 7.2 %
Neutro Abs: 6232 cells/uL (ref 1500–7800)
Neutrophils Relative %: 66.3 %
Platelets: 345 10*3/uL (ref 140–400)
RBC: 4.88 10*6/uL (ref 4.20–5.80)
RDW: 11.4 % (ref 11.0–15.0)
Total Lymphocyte: 24.3 %
WBC: 9.4 10*3/uL (ref 3.8–10.8)

## 2023-01-02 LAB — LIPID PANEL
Cholesterol: 123 mg/dL (ref <200)
HDL: 46 mg/dL (ref >=40)
LDL Cholesterol (Calc): 61 mg/dL (calc)
Non-HDL Cholesterol (Calc): 77 mg/dL (calc) (ref <130)
Total CHOL/HDL Ratio: 2.7 (calc) (ref <5.0)
Triglycerides: 79 mg/dL (ref <150)

## 2023-01-02 LAB — COMPLETE METABOLIC PANEL WITH GFR
AG Ratio: 1.9 (calc) (ref 1.0–2.5)
ALT: 20 U/L (ref 9–46)
AST: 21 U/L (ref 10–35)
Albumin: 4.6 g/dL (ref 3.6–5.1)
Alkaline phosphatase (APISO): 70 U/L (ref 35–144)
BUN: 10 mg/dL (ref 7–25)
CO2: 31 mmol/L (ref 20–32)
Calcium: 9.8 mg/dL (ref 8.6–10.3)
Chloride: 101 mmol/L (ref 98–110)
Creat: 0.97 mg/dL (ref 0.70–1.22)
Globulin: 2.4 g/dL (calc) (ref 1.9–3.7)
Glucose, Bld: 92 mg/dL (ref 65–99)
Potassium: 4.2 mmol/L (ref 3.5–5.3)
Sodium: 139 mmol/L (ref 135–146)
Total Bilirubin: 0.6 mg/dL (ref 0.2–1.2)
Total Protein: 7 g/dL (ref 6.1–8.1)
eGFR: 79 mL/min/{1.73_m2} (ref >=60)

## 2023-01-02 LAB — HEMOGLOBIN A1C
Hgb A1c MFr Bld: 5.9 % of total Hgb — ABNORMAL HIGH (ref <5.7)
Mean Plasma Glucose: 123 mg/dL
eAG (mmol/L): 6.8 mmol/L

## 2023-01-04 ENCOUNTER — Encounter: Payer: Self-pay | Admitting: Orthopedic Surgery

## 2023-01-04 ENCOUNTER — Ambulatory Visit: Payer: Medicare HMO | Admitting: Orthopedic Surgery

## 2023-01-04 VITALS — BP 127/78 | HR 67 | Temp 98.2°F | Resp 18 | Ht 65.0 in | Wt 143.1 lb

## 2023-01-04 DIAGNOSIS — E785 Hyperlipidemia, unspecified: Secondary | ICD-10-CM

## 2023-01-04 DIAGNOSIS — R7303 Prediabetes: Secondary | ICD-10-CM | POA: Diagnosis not present

## 2023-01-04 DIAGNOSIS — E1169 Type 2 diabetes mellitus with other specified complication: Secondary | ICD-10-CM | POA: Diagnosis not present

## 2023-01-04 DIAGNOSIS — M25511 Pain in right shoulder: Secondary | ICD-10-CM | POA: Diagnosis not present

## 2023-01-04 DIAGNOSIS — G8929 Other chronic pain: Secondary | ICD-10-CM

## 2023-01-04 DIAGNOSIS — I10 Essential (primary) hypertension: Secondary | ICD-10-CM

## 2023-01-04 DIAGNOSIS — J302 Other seasonal allergic rhinitis: Secondary | ICD-10-CM

## 2023-01-04 DIAGNOSIS — M25512 Pain in left shoulder: Secondary | ICD-10-CM

## 2023-01-04 NOTE — Patient Instructions (Signed)
Recommend trying humidifier at bedtime to help with dry air  Try Xyzal or Zyrtec for nasal congestion  Mucinex ok to take to thin secretions

## 2023-01-04 NOTE — Progress Notes (Signed)
Careteam: Patient Care Team: Octavia Heir, NP as PCP - General (Adult Health Nurse Practitioner) [name], MD as Consulting Physician (Ophthalmology) [name], MD as Consulting Physician (Gastroenterology)  Seen by: [name], AGNP-C  PLACE OF SERVICE:  [facility]  Advanced Directive information    Allergies  Allergen Reactions   Penicillins Swelling   Shellfish Allergy Swelling    Chief Complaint  Patient presents with   Medical Management of Chronic Issues    Six Month Follow Up   Quality Metric Gaps    Needs to discuss Diabetic Kidney Evaluation and Covid-19 vaccine. [facility] verified     HPI: Patient is a 82 y.o. male seen today for medical management of chronic conditions.   Lab work discussed with patient.   No health concerns today.   A1c 5.9> was 5.7. Admits to holiday eating.   2 growths removed from right palm 12/25/2022. F/u with Dr. Melvyn Novas today. Denies pain.   Continues to have radiation of pain down bilateral arms daily or every other day. Followed by Dr. Ranell Patrick. He is taking gabapentin TID ( 200 mg AM, 100 mg evening, 300 mg bedtime).   Increased clear nasal congestion and productive cough. He does not take allergy medication.     Review of Systems:  Review of Systems  Constitutional:  Negative for chills and fever.  HENT:  Positive for congestion. Negative for hearing loss and sore throat.   Eyes:  Negative for blurred vision and double vision.  Respiratory:  Positive for sputum production. Negative for cough, shortness of breath and wheezing.   Cardiovascular:  Negative for chest pain and leg swelling.  Gastrointestinal:  Negative for abdominal pain and heartburn.  Genitourinary:  Negative for dysuria.  Musculoskeletal:  Positive for joint pain and myalgias. Negative for falls.  Skin:  Negative for rash.  Neurological:  Negative for dizziness, weakness and headaches.  Psychiatric/Behavioral:  Negative for depression and memory  loss. The patient is not nervous/anxious and does not have insomnia.     Past Medical History:  Diagnosis Date   Abdominal pain, other specified site    Backache, unspecified    Carpal tunnel syndrome    Diabetes mellitus without complication (HCC)    Essential hypertension, benign    H/O hand surgery 02/25/2020   R hand    Hyperlipidemia    Impacted cerumen    Impaired fasting glucose    Other abnormal blood chemistry    Pain in joint, lower leg    Special screening for malignant neoplasm of prostate    Unspecified essential hypertension    Unspecified hypothyroidism    Past Surgical History:  Procedure Laterality Date   (L) KNEE SURGERY"TORN MENISCUS"     EYE SURGERY Left 11/24/2014   cataracts   Social History:   reports that he quit smoking about 57 years ago. His smoking use included cigarettes. He has never used smokeless tobacco. He reports that he does not drink alcohol and does not use drugs.  Family History  Problem Relation Age of Onset   Alzheimer's disease Sister    Cancer Brother 56       lung   Diabetes Sister    Diabetes Sister    Lung cancer Sister 14       mets to back   Kidney disease Sister    Allergies Sister    Obesity Sister    Cancer Mother     Medications: Patient's Medications  New Prescriptions  No medications on file  Previous Medications   ASCORBIC ACID (VITAMIN C) 1000 MG TABLET    Take 1,000 mg by mouth 2 (two) times daily.   ASPIRIN 81 MG TABLET    Take 81 mg by mouth daily.   ASPIRIN-SALICYLAMIDE-CAFFEINE (BC HEADACHE POWDER PO)    Take by mouth as needed.   ATORVASTATIN (LIPITOR) 40 MG TABLET    TAKE 1 TABLET DAILY TO LOWER CHOLESTEROL   B COMPLEX VITAMINS CAPSULE    Take 1 capsule by mouth daily.   GABAPENTIN (NEURONTIN) 100 MG CAPSULE    Take 1 capsule (100 mg total) by mouth 2 (two) times daily.   LATANOPROST (XALATAN) 0.005 % OPHTHALMIC SOLUTION    Place 1 drop into both eyes at bedtime.   LISINOPRIL-HYDROCHLOROTHIAZIDE  (ZESTORETIC) 20-12.5 MG TABLET    TAKE 1 TABLET DAILY   METFORMIN (GLUCOPHAGE) 500 MG TABLET    TAKE 1 TABLET IN THE MORNING AND ONE-HALF (1/2) TABLET IN THE EVENING   MULTIPLE VITAMINS-MINERALS (MULTIVITAMIN & MINERAL PO)    Take 1 tablet by mouth daily.   OMEPRAZOLE (PRILOSEC) 20 MG CAPSULE    Take 20 mg by mouth as needed.   SILDENAFIL (VIAGRA) 50 MG TABLET    TAKE 1 TABLET AS NEEDED FOR ERECTILE DYSFUNCTION   TERBINAFINE (LAMISIL) 250 MG TABLET    Take 1 tablet (250 mg total) by mouth daily.   VITAMIN B-12 (CYANOCOBALAMIN) 1000 MCG TABLET    Take 500 mcg by mouth daily.   Modified Medications   No medications on file  Discontinued Medications   No medications on file    Physical Exam:  There were no vitals filed for this visit. There is no height or weight on file to calculate BMI. Wt Readings from Last 3 Encounters:  10/19/22 139 lb (63 kg)  06/29/22 141 lb (64 kg)  03/02/22 140 lb 11.2 oz (63.8 kg)    Physical Exam Vitals reviewed.  Constitutional:      General: He is not in acute distress. HENT:     Head: Normocephalic.     Nose: Congestion present.     Mouth/Throat:     Mouth: Mucous membranes are moist.  Eyes:     General:        Right eye: No discharge.        Left eye: No discharge.  Neck:     Vascular: No carotid bruit.  Cardiovascular:     Rate and Rhythm: Normal rate and regular rhythm.     Pulses: Normal pulses.     Heart sounds: Normal heart sounds.  Pulmonary:     Effort: Pulmonary effort is normal. No respiratory distress.     Breath sounds: Normal breath sounds. No wheezing.  Abdominal:     General: Bowel sounds are normal. There is no distension.     Palpations: Abdomen is soft.     Tenderness: There is no abdominal tenderness.  Musculoskeletal:     Cervical back: Neck supple.     Right lower leg: No edema.     Left lower leg: No edema.  Lymphadenopathy:     Cervical: No cervical adenopathy.  Skin:    General: Skin is warm and dry.      Capillary Refill: Capillary refill takes less than 2 seconds.  Neurological:     General: No focal deficit present.     Mental Status: He is alert and oriented to person, place, and time.  Psychiatric:  Mood and Affect: Mood normal.        Behavior: Behavior normal.     Labs reviewed: Basic Metabolic Panel: Recent Labs    06/26/22 0837 01/01/23 0904  NA 141 139  K 4.6 4.2  CL 103 101  CO2 30 31  GLUCOSE 97 92  BUN 9 10  CREATININE 1.03 0.97  CALCIUM 9.7 9.8   Liver Function Tests: Recent Labs    06/26/22 0837 11/24/22 0932 01/01/23 0904  AST 20 20 21   ALT 20 19 20   ALKPHOS  --  80  --   BILITOT 0.5 0.6 0.6  PROT 6.9 7.0 7.0  ALBUMIN  --  4.7  --    No results for input(s): "LIPASE", "AMYLASE" in the last 8760 hours. No results for input(s): "AMMONIA" in the last 8760 hours. CBC: Recent Labs    06/26/22 0837 11/24/22 0932 01/01/23 0904  WBC 8.5 11.3* 9.4  NEUTROABS 5,083 7.6* 6,232  HGB 15.5 14.7 14.8  HCT 46.5 42.5 44.0  MCV 92.1 90 90.2  PLT 319 331 345   Lipid Panel: Recent Labs    06/26/22 0837 01/01/23 0904  CHOL 123 123  HDL 53 46  LDLCALC 55 61  TRIG 66 79  CHOLHDL 2.3 2.7   TSH: No results for input(s): "TSH" in the last 8760 hours. A1C: Lab Results  Component Value Date   HGBA1C 5.9 (H) 01/01/2023     Assessment/Plan 1. Prediabetes - A1c 5.9> was 5.7 (06/2022) - admits to holiday eating - eye exam 01/2022 - cont metformin, asa, lisinopril-HCTZ, and statin - Microalbumin/Creatinine Ratio, Urine  2. Hyperlipidemia associated with type 2 diabetes mellitus (HCC) - LDL stable - cont atorvastatin  3. Essential hypertension, benign - controlled - BUN/creat 10/0.97 - cont lisinopril-HCTZ  4. 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 Veverly Fells - cong gabapentin  5. Seasonal allergies - increased clear nasal congestion - advised trial Xyzal or Zyrtec qhs x 14 days - may also try humidifier qhs  Total  time: 30 minutes. Greater than 50% of total time spent doing patient education regarding health maintenance, HTN, HLD, prediabetes, nasal congestion and chronic shoulder pain including symptom/medication management.     Next appt: Visit date not found  [place], [facility] [phone]

## 2023-01-05 LAB — MICROALBUMIN / CREATININE URINE RATIO
Creatinine, Urine: 136 mg/dL (ref 20–320)
Microalb Creat Ratio: 5 mcg/mg creat (ref <30)
Microalb, Ur: 0.7 mg/dL

## 2023-01-29 ENCOUNTER — Ambulatory Visit: Payer: Medicare HMO | Admitting: Podiatry

## 2023-01-29 DIAGNOSIS — Z79899 Other long term (current) drug therapy: Secondary | ICD-10-CM

## 2023-01-29 DIAGNOSIS — B351 Tinea unguium: Secondary | ICD-10-CM | POA: Diagnosis not present

## 2023-01-29 NOTE — Patient Instructions (Signed)
Terbinafine Tablets What is this medication? TERBINAFINE (TER bin a feen) treats fungal infections of the nails. It belongs to a group of medications called antifungals. It will not treat infections caused by bacteria or viruses. This medicine may be used for other purposes; ask your health care provider or pharmacist if you have questions. COMMON BRAND NAME(S): Lamisil, Terbinex What should I tell my care team before I take this medication? They need to know if you have any of these conditions: Liver disease An unusual or allergic reaction to terbinafine, other medications, foods, dyes, or preservatives Pregnant or trying to get pregnant Breast-feeding How should I use this medication? Take this medication by mouth with water. Take it as directed on the prescription label at the same time every day. You can take it with or without food. If it upsets your stomach, take it with food. Keep taking it unless your care team tells you to stop. A special MedGuide will be given to you by the pharmacist with each prescription and refill. Be sure to read this information carefully each time. Talk to your care team regarding the use of this medication in children. Special care may be needed. Overdosage: If you think you have taken too much of this medicine contact a poison control center or emergency room at once. NOTE: This medicine is only for you. Do not share this medicine with others. What if I miss a dose? If you miss a dose, take it as soon as you can unless it is more than 4 hours late. If it is more than 4 hours late, skip the missed dose. Take the next dose at the normal time. What may interact with this medication? Do not take this medication with any of the following: Pimozide Thioridazine This medication may also interact with the following: Beta blockers Caffeine Certain medications for mental health conditions Cimetidine Cyclosporine Medications for fungal infections like fluconazole  and ketoconazole Medications for irregular heartbeat like amiodarone, flecainide and propafenone Rifampin Warfarin This list may not describe all possible interactions. Give your health care provider a list of all the medicines, herbs, non-prescription drugs, or dietary supplements you use. Also tell them if you smoke, drink alcohol, or use illegal drugs. Some items may interact with your medicine. What should I watch for while using this medication? Visit your care team for regular checks on your progress. You may need blood work while you are taking this medication. It may be some time before you see the benefit from this medication. This medication may cause serious skin reactions. They can happen weeks to months after starting the medication. Contact your care team right away if you notice fevers or flu-like symptoms with a rash. The rash may be red or purple and then turn into blisters or peeling of the skin. Or, you might notice a red rash with swelling of the face, lips or lymph nodes in your neck or under your arms. This medication can make you more sensitive to the sun. Keep out of the sun, If you cannot avoid being in the sun, wear protective clothing and sunscreen. Do not use sun lamps or tanning beds/booths. What side effects may I notice from receiving this medication? Side effects that you should report to your care team as soon as possible: Allergic reactions--skin rash, itching, hives, swelling of the face, lips, tongue, or throat Change in sense of smell Change in taste Infection--fever, chills, cough, or sore throat Liver injury--right upper belly pain, loss of appetite, nausea,  light-colored stool, dark yellow or brown urine, yellowing skin or eyes, unusual weakness or fatigue Low red blood cell level--unusual weakness or fatigue, dizziness, headache, trouble breathing Lupus-like syndrome--joint pain, swelling, or stiffness, butterfly-shaped rash on the face, rashes that get worse  in the sun, fever, unusual weakness or fatigue Rash, fever, and swollen lymph nodes Redness, blistering, peeling, or loosening of the skin, including inside the mouth Unusual bruising or bleeding Worsening mood, feelings of depression Side effects that usually do not require medical attention (report to your care team if they continue or are bothersome): Diarrhea Gas Headache Nausea Stomach pain Upset stomach This list may not describe all possible side effects. Call your doctor for medical advice about side effects. You may report side effects to FDA at 1-800-FDA-1088. Where should I keep my medication? Keep out of the reach of children and pets. Store between 20 and 25 degrees C (68 and 77 degrees F). Protect from light. Get rid of any unused medication after the expiration date. To get rid of medications that are no longer needed or have expired: Take the medication to a medication take-back program. Check with your pharmacy or law enforcement to find a location. If you cannot return the medication, check the label or package insert to see if the medication should be thrown out in the garbage or flushed down the toilet. If you are not sure, ask your care team. If it is safe to put it in the trash, take the medication out of the container. Mix the medication with cat litter, dirt, coffee grounds, or other unwanted substance. Seal the mixture in a bag or container. Put it in the trash. NOTE: This sheet is a summary. It may not cover all possible information. If you have questions about this medicine, talk to your doctor, pharmacist, or health care provider.  2023 Elsevier/Gold Standard (2021-06-28 00:00:00)

## 2023-01-31 LAB — HEPATIC FUNCTION PANEL
ALT: 24 IU/L (ref 0–44)
AST: 26 IU/L (ref 0–40)
Albumin: 4.8 g/dL (ref 3.8–4.8)
Alkaline Phosphatase: 82 IU/L (ref 44–121)
Bilirubin Total: 0.6 mg/dL (ref 0.0–1.2)
Bilirubin, Direct: 0.17 mg/dL (ref 0.00–0.40)
Total Protein: 7.1 g/dL (ref 6.0–8.5)

## 2023-01-31 LAB — CBC WITH DIFFERENTIAL/PLATELET
Basophils Absolute: 0.1 10*3/uL (ref 0.0–0.2)
Basos: 1 %
EOS (ABSOLUTE): 0.4 10*3/uL (ref 0.0–0.4)
Eos: 5 %
Hematocrit: 42.5 % (ref 37.5–51.0)
Hemoglobin: 14.4 g/dL (ref 13.0–17.7)
Immature Grans (Abs): 0 10*3/uL (ref 0.0–0.1)
Immature Granulocytes: 0 %
Lymphocytes Absolute: 2.2 10*3/uL (ref 0.7–3.1)
Lymphs: 25 %
MCH: 29.9 pg (ref 26.6–33.0)
MCHC: 33.9 g/dL (ref 31.5–35.7)
MCV: 88 fL (ref 79–97)
Monocytes Absolute: 1.2 10*3/uL — ABNORMAL HIGH (ref 0.1–0.9)
Monocytes: 14 %
Neutrophils Absolute: 4.8 10*3/uL (ref 1.4–7.0)
Neutrophils: 55 %
Platelets: 314 10*3/uL (ref 150–450)
RBC: 4.81 x10E6/uL (ref 4.14–5.80)
RDW: 11.5 % — ABNORMAL LOW (ref 11.6–15.4)
WBC: 8.7 10*3/uL (ref 3.4–10.8)

## 2023-02-01 NOTE — Progress Notes (Signed)
Subjective: Chief Complaint  Patient presents with   Follow-up    Nail fungus, bilateral feet, right over left    82 year old male presents the office today with his wife for follow-up evaluation of nail fungus.  He states that they are getting better and he has been on the Lamisil without any side effects.  No fevers or chills.  No other concerns.  Objective: AAO x3, NAD DP/PT pulses palpable bilaterally, CRT less than 3 seconds Nails continue to be hypertrophic, dystrophic with yellow, brown discoloration.  There is clearing on the proximal nail folds.  There is no erythema warmth or any obvious signs of infection.  No open lesions. No pain with calf compression, swelling, warmth, erythema  Assessment: Onychomycosis, currently on Lamisil  Plan: -All treatment options discussed with the patient including all alternatives, risks, complications.  -As a courtesy I sharply debrided nails without any complications or bleeding.  Will likely continue Lamisil for additional 30 days but will recheck a CBC and LFT prior to starting medication. -Patient encouraged to call the office with any questions, concerns, change in symptoms.   Trula Slade DPM

## 2023-02-05 ENCOUNTER — Other Ambulatory Visit: Payer: Self-pay | Admitting: Podiatry

## 2023-02-05 MED ORDER — TERBINAFINE HCL 250 MG PO TABS: 250.0000 mg | ORAL_TABLET | Freq: Every day | ORAL | 0 refills | Status: DC

## 2023-03-06 ENCOUNTER — Encounter: Payer: Self-pay | Admitting: Family

## 2023-03-06 ENCOUNTER — Ambulatory Visit (INDEPENDENT_AMBULATORY_CARE_PROVIDER_SITE_OTHER): Payer: Medicare HMO | Admitting: Family

## 2023-03-06 DIAGNOSIS — Z0000 Encounter for general adult medical examination without abnormal findings: Secondary | ICD-10-CM | POA: Diagnosis not present

## 2023-03-06 NOTE — Patient Instructions (Addendum)
Tyler Hull , Thank you for taking time to come for your Medicare Wellness Visit. I appreciate your ongoing commitment to your health goals. Please review the following plan we discussed and let me know if I can assist you in the future.   Screening recommendations/referrals: Colonoscopy N/A Recommended yearly ophthalmology/optometry visit for glaucoma screening and checkup Recommended yearly dental visit for hygiene and checkup  Vaccinations: Influenza vaccine due annually in September/October Pneumococcal vaccine : Up to date  Tdap vaccine : Up to date  Shingles vaccine : Up to date     Advanced directives: No   Conditions/risks identified: advanced age (>89men, >91 women);diabetes mellitus;male gender;dyslipidemia;smoking/ tobacco exposure;hypertension  Next appointment: 1 year   Preventive Care 13 Years and Older, Male Preventive care refers to lifestyle choices and visits with your health care provider that can promote health and wellness. What does preventive care include? A yearly physical exam. This is also called an annual well check. Dental exams once or twice a year. Routine eye exams. Ask your health care provider how often you should have your eyes checked. Personal lifestyle choices, including: Daily care of your teeth and gums. Regular physical activity. Eating a healthy diet. Avoiding tobacco and drug use. Limiting alcohol use. Practicing safe sex. Taking low doses of aspirin every day. Taking vitamin and mineral supplements as recommended by your health care provider. What happens during an annual well check? The services and screenings done by your health care provider during your annual well check will depend on your age, overall health, lifestyle risk factors, and family history of disease. Counseling  Your health care provider may ask you questions about your: Alcohol use. Tobacco use. Drug use. Emotional well-being. Home and relationship  well-being. Sexual activity. Eating habits. History of falls. Memory and ability to understand (cognition). Work and work Statistician. Screening  You may have the following tests or measurements: Height, weight, and BMI. Blood pressure. Lipid and cholesterol levels. These may be checked every 5 years, or more frequently if you are over 38 years old. Skin check. Lung cancer screening. You may have this screening every year starting at age 77 if you have a 30-pack-year history of smoking and currently smoke or have quit within the past 15 years. Fecal occult blood test (FOBT) of the stool. You may have this test every year starting at age 4. Flexible sigmoidoscopy or colonoscopy. You may have a sigmoidoscopy every 5 years or a colonoscopy every 10 years starting at age 22. Prostate cancer screening. Recommendations will vary depending on your family history and other risks. Hepatitis C blood test. Hepatitis B blood test. Sexually transmitted disease (STD) testing. Diabetes screening. This is done by checking your blood sugar (glucose) after you have not eaten for a while (fasting). You may have this done every 1-3 years. Abdominal aortic aneurysm (AAA) screening. You may need this if you are a current or former smoker. Osteoporosis. You may be screened starting at age 99 if you are at high risk. Talk with your health care provider about your test results, treatment options, and if necessary, the need for more tests. Vaccines  Your health care provider may recommend certain vaccines, such as: Influenza vaccine. This is recommended every year. Tetanus, diphtheria, and acellular pertussis (Tdap, Td) vaccine. You may need a Td booster every 10 years. Zoster vaccine. You may need this after age 52. Pneumococcal 13-valent conjugate (PCV13) vaccine. One dose is recommended after age 60. Pneumococcal polysaccharide (PPSV23) vaccine. One dose is recommended after  age 45. Talk to your health care  provider about which screenings and vaccines you need and how often you need them. This information is not intended to replace advice given to you by your health care provider. Make sure you discuss any questions you have with your health care provider. Document Released: 12/31/2015 Document Revised: 08/23/2016 Document Reviewed: 10/05/2015 Elsevier Interactive Patient Education  2017 [place] Prevention in the Home Falls can cause injuries. They can happen to people of all ages. There are many things you can do to make your home safe and to help prevent falls. What can I do on the outside of my home? Regularly fix the edges of walkways and driveways and fix any cracks. Remove anything that might make you trip as you walk through a door, such as a raised step or threshold. Trim any bushes or trees on the path to your home. Use bright outdoor lighting. Clear any walking paths of anything that might make someone trip, such as rocks or tools. Regularly check to see if handrails are loose or broken. Make sure that both sides of any steps have handrails. Any raised decks and porches should have guardrails on the edges. Have any leaves, snow, or ice cleared regularly. Use sand or salt on walking paths during winter. Clean up any spills in your garage right away. This includes oil or grease spills. What can I do in the bathroom? Use night lights. Install grab bars by the toilet and in the tub and shower. Do not use towel bars as grab bars. Use non-skid mats or decals in the tub or shower. If you need to sit down in the shower, use a plastic, non-slip stool. Keep the floor dry. Clean up any water that spills on the floor as soon as it happens. Remove soap buildup in the tub or shower regularly. Attach bath mats securely with double-sided non-slip rug tape. Do not have throw rugs and other things on the floor that can make you trip. What can I do in the bedroom? Use night lights. Make  sure that you have a light by your bed that is easy to reach. Do not use any sheets or blankets that are too big for your bed. They should not hang down onto the floor. Have a firm chair that has side arms. You can use this for support while you get dressed. Do not have throw rugs and other things on the floor that can make you trip. What can I do in the kitchen? Clean up any spills right away. Avoid walking on wet floors. Keep items that you use a lot in easy-to-reach places. If you need to reach something above you, use a strong step stool that has a grab bar. Keep electrical cords out of the way. Do not use floor polish or wax that makes floors slippery. If you must use wax, use non-skid floor wax. Do not have throw rugs and other things on the floor that can make you trip. What can I do with my stairs? Do not leave any items on the stairs. Make sure that there are handrails on both sides of the stairs and use them. Fix handrails that are broken or loose. Make sure that handrails are as long as the stairways. Check any carpeting to make sure that it is firmly attached to the stairs. Fix any carpet that is loose or worn. Avoid having throw rugs at the top or bottom of the stairs. If you do  have throw rugs, attach them to the floor with carpet tape. Make sure that you have a light switch at the top of the stairs and the bottom of the stairs. If you do not have them, ask someone to add them for you. What else can I do to help prevent falls? Wear shoes that: Do not have high heels. Have rubber bottoms. Are comfortable and fit you well. Are closed at the toe. Do not wear sandals. If you use a stepladder: Make sure that it is fully opened. Do not climb a closed stepladder. Make sure that both sides of the stepladder are locked into place. Ask someone to hold it for you, if possible. Clearly mark and make sure that you can see: Any grab bars or handrails. First and last steps. Where the  edge of each step is. Use tools that help you move around (mobility aids) if they are needed. These include: Canes. Walkers. Scooters. Crutches. Turn on the lights when you go into a dark area. Replace any light bulbs as soon as they burn out. Set up your furniture so you have a clear path. Avoid moving your furniture around. If any of your floors are uneven, fix them. If there are any pets around you, be aware of where they are. Review your medicines with your doctor. Some medicines can make you feel dizzy. This can increase your chance of falling. Ask your doctor what other things that you can do to help prevent falls. This information is not intended to replace advice given to you by your health care provider. Make sure you discuss any questions you have with your health care provider. Document Released: 09/30/2009 Document Revised: 05/11/2016 Document Reviewed: 01/08/2015 Elsevier Interactive Patient Education  2017 [employer].

## 2023-03-06 NOTE — Progress Notes (Signed)
This service is provided via telemedicine  No vital signs collected/recorded due to the encounter was a telemedicine visit.   Location of patient (ex: home, work):  Home  Patient consents to a telephone visit:  Yes  Location of the provider (ex: office, home):  [employer].  Name of any referring provider:  Yvonna Alanis, NP   Names of all persons participating in the telemedicine service and their role in the encounter:  Patient, Heriberto Antigua, [place], [place], Webb Silversmith, NP.    Time spent on call: 8 minutes spent on the phone with Medical Assistant.     Subjective:   Tyler Hull is a 82 y.o. male who presents for Medicare Annual/Subsequent preventive examination.  Review of Systems    Cardiac Risk Factors include: advanced age (>2men, >82 women);diabetes mellitus;male gender;dyslipidemia;smoking/ tobacco exposure;hypertension     Objective:    There were no vitals filed for this visit. There is no height or weight on file to calculate BMI.     03/06/2023   12:56 PM 06/29/2022    8:45 AM 03/02/2022   11:35 AM 02/08/2022   11:57 AM 02/24/2021   11:45 AM 01/27/2021   10:46 AM 09/23/2020   10:41 AM  Advanced Directives  Does Patient Have a Medical Advance Directive? No No Yes Yes Yes Yes Yes  Type of Comptroller;Living will [place];Living will [place];Living will [place];Living will [place];Living will  Does patient want to make changes to medical advance directive?   No - Patient declined No - Patient declined No - Patient declined No - Patient declined No - Patient declined  Copy of [place] in Chart?   Yes - validated most recent copy scanned in chart (See row information) Yes - validated most recent copy scanned in chart (See row information) Yes - validated most recent copy scanned in chart (See row information) Yes -  validated most recent copy scanned in chart (See row information) Yes - validated most recent copy scanned in chart (See row information)  Would patient like information on creating a medical advance directive? No - Patient declined No - Patient declined         Current Medications (verified) Outpatient Encounter Medications as of 03/06/2023  Medication Sig   Ascorbic Acid (VITAMIN C) 1000 MG tablet Take 1,000 mg by mouth 2 (two) times daily.   aspirin 81 MG tablet Take 81 mg by mouth daily.   Aspirin-Salicylamide-Caffeine (BC HEADACHE POWDER PO) Take by mouth as needed.   atorvastatin (LIPITOR) 40 MG tablet TAKE 1 TABLET DAILY TO LOWER CHOLESTEROL   b complex vitamins capsule Take 1 capsule by mouth daily.   gabapentin (NEURONTIN) 100 MG capsule Take 1 capsule (100 mg total) by mouth 2 (two) times daily.   latanoprost (XALATAN) 0.005 % ophthalmic solution Place 1 drop into both eyes at bedtime.   lisinopril-hydrochlorothiazide (ZESTORETIC) 20-12.5 MG tablet TAKE 1 TABLET DAILY   metFORMIN (GLUCOPHAGE) 500 MG tablet TAKE 1 TABLET IN THE MORNING AND ONE-HALF (1/2) TABLET IN THE EVENING   Multiple Vitamins-Minerals (MULTIVITAMIN & MINERAL PO) Take 1 tablet by mouth daily.   omeprazole (PRILOSEC) 20 MG capsule Take 20 mg by mouth as needed.   sildenafil (VIAGRA) 50 MG tablet TAKE 1 TABLET AS NEEDED FOR ERECTILE DYSFUNCTION   terbinafine (LAMISIL) 250 MG tablet Take 1 tablet (250 mg total) by mouth daily.  vitamin B-12 (CYANOCOBALAMIN) 1000 MCG tablet Take 500 mcg by mouth daily.    [DISCONTINUED] terbinafine (LAMISIL) 250 MG tablet Take 1 tablet (250 mg total) by mouth daily.   No facility-administered encounter medications on file as of 03/06/2023.    Allergies (verified) Penicillins and Shellfish allergy   History: Past Medical History:  Diagnosis Date   Abdominal pain, other specified site    Backache, unspecified    Carpal tunnel syndrome    Diabetes mellitus without complication  ([place])    Essential hypertension, benign    H/O hand surgery 02/25/2020   R hand    Hyperlipidemia    Impacted cerumen    Impaired fasting glucose    Other abnormal blood chemistry    Pain in joint, lower leg    Special screening for malignant neoplasm of prostate    Unspecified essential hypertension    Unspecified hypothyroidism    Past Surgical History:  Procedure Laterality Date   (L) KNEE SURGERY"TORN MENISCUS"     DG HAND RIGHT COMPLETE ([place] HX) Right 12/25/2022   EYE SURGERY Left 11/24/2014   cataracts   Family History  Problem Relation Age of Onset   Alzheimer's disease Sister    Cancer Brother 51       lung   Diabetes Sister    Diabetes Sister    Lung cancer Sister 40       mets to back   Kidney disease Sister    Allergies Sister    Obesity Sister    Cancer Mother    Social History   Socioeconomic History   Marital status: Married    Spouse name: Not on file   Number of children: Not on file   Years of education: Not on file   Highest education level: Not on file  Occupational History   Not on file  Tobacco Use   Smoking status: Former    Years: 10    Types: Cigarettes    Quit date: 01/02/1966    Years since quitting: 57.2   Smokeless tobacco: Never   Tobacco comments:    Quit at age 63   Vaping Use   Vaping Use: Never used  Substance and Sexual Activity   Alcohol use: No   Drug use: No   Sexual activity: Yes  Other Topics Concern   Not on file  Social History Narrative   Not on file   Social Determinants of Health   Financial Resource Strain: Low Risk  (02/12/2018)   Overall Financial Resource Strain (CARDIA)    Difficulty of Paying Living Expenses: Not hard at all  Food Insecurity: No Food Insecurity (02/12/2018)   Hunger Vital Sign    Worried About Running Out of Food in the Last Year: Never true    [place] in the Last Year: Never true  Transportation Needs: No Transportation Needs (02/12/2018)   PRAPARE - Armed forces logistics/support/administrative officer (Medical): No    Lack of Transportation (Non-Medical): No  Physical Activity: Insufficiently Active (02/12/2018)   Exercise Vital Sign    Days of Exercise per Week: 3 days    Minutes of Exercise per Session: 40 min  Stress: No Stress Concern Present (02/12/2018)   [place]    Feeling of Stress : Not at all  Social Connections: Moderately Integrated (02/12/2018)   Social Connection and Isolation Panel [NHANES]    Frequency of Communication with Friends and Family: More  than three times a week    Frequency of Social Gatherings with Friends and Family: Once a week    Attends Religious Services: More than 4 times per year    Active Member of [employer] or Organizations: No    Attends Music therapist: Never    Marital Status: Married    Tobacco Counseling Counseling given: Not Answered Tobacco comments: Quit at age 89    Clinical Intake:  Pre-visit preparation completed: No  Pain : No/denies pain     BMI - recorded: 23.82 Nutritional Status: BMI of 19-24  Normal Nutritional Risks: None Diabetes: Yes CBG done?: No Did pt. bring in CBG monitor from home?: No  How often do you need to have someone help you when you read instructions, pamphlets, or other written materials from your doctor or pharmacy?: 1 - Never What is the last grade level you completed in school?: 12 grade  Diabetic?Yes  Interpreter Needed?: No      Activities of Daily Living    03/06/2023    1:11 PM  In your present state of health, do you have any difficulty performing the following activities:  Hearing? 0  Vision? 0  Difficulty concentrating or making decisions? 0  Walking or climbing stairs? 0  Dressing or bathing? 0  Doing errands, shopping? 0  Preparing Food and eating ? N  Using the Toilet? N  In the past six months, have you accidently leaked urine? N  Do you have problems with loss of bowel  control? N  Managing your Medications? N  Managing your Finances? N  Housekeeping or managing your Housekeeping? N    Patient Care Team: Yvonna Alanis, NP as PCP - General (Adult Health Nurse Practitioner) Monna Fam, MD as Consulting Physician (Ophthalmology) Carol Ada, MD as Consulting Physician (Gastroenterology)  Indicate any recent Medical Services you may have received from other than [facility] providers in the past year (date may be approximate).     Assessment:   This is a routine wellness examination for Tyler Hull.  Hearing/Vision screen Hearing Screening - Comments:: No hearing concerns. Patient wears hearing aids.  Vision Screening - Comments:: No vision concerns.   Dietary issues and exercise activities discussed: Current Exercise Habits: The patient has a physically strenuous job, but has no regular exercise apart from work.;Structured exercise class, Type of exercise: yoga, Time (Minutes): 45, Frequency (Times/Week): 2, Weekly Exercise (Minutes/Week): 90, Intensity: Moderate, Exercise limited by: None identified   Goals Addressed             This Visit's Progress    Increase water intake   Not on track    Starting 02/08/17, I will attempt to increase my water intake from 2 bottles per day to 5 bottles per day.        Depression Screen    03/06/2023   12:53 PM 01/04/2023   10:32 AM 03/02/2022   11:31 AM 02/09/2022   10:07 AM 02/24/2021   11:41 AM 09/23/2020   10:40 AM 05/13/2020   10:44 AM  PHQ 2/9 Scores  PHQ - 2 Score 0 0 0 0 0 0 0    Fall Risk    03/06/2023   12:53 PM 01/04/2023   10:42 AM 01/04/2023   10:32 AM 06/29/2022    8:45 AM 03/02/2022   11:30 AM  Fall Risk   Falls in the past year? 0 0 0 0 1  Number falls in past yr: 0 0 0 0 0  Injury with  Fall? 0 0 0 0 0  Risk for fall due to : No Fall Risks No Fall Risks  No Fall Risks History of fall(s)  Follow up Falls evaluation completed Falls evaluation completed  Falls evaluation completed Falls  evaluation completed    [place]:  Any stairs in or around the home? No  If so, are there any without handrails? No  Home free of loose throw rugs in walkways, pet beds, electrical cords, etc? No  Adequate lighting in your home to reduce risk of falls? No   ASSISTIVE DEVICES UTILIZED TO PREVENT FALLS:  Life alert? No  Use of a cane, walker or w/c? No  Grab bars in the bathroom? Yes  Shower chair or bench in shower? No  Elevated toilet seat or a handicapped toilet? No   TIMED UP AND GO:  Was the test performed? No .  Length of time to ambulate 10 feet: N/A sec.   Gait slow and steady without use of assistive device  Cognitive Function:    02/18/2019    2:58 PM 02/12/2018    2:52 PM 02/08/2017    8:40 AM 01/03/2016    2:14 PM  MMSE - Mini Mental State Exam  Orientation to time 5 4 5 5   Orientation to Place 5 5 5 5   Registration 3 3 3 3   Attention/ Calculation 5 5 5 5   Recall 3 2 3 3   Language- name 2 objects 2 2 2 2   Language- repeat 1 1 1 1   Language- follow 3 step command 3 3 3 3   Language- read & follow direction 1 1 1 1   Write a sentence 1 1 1 1   Copy design 1 1 1 1   Total score 30 28 30 30         03/06/2023   12:53 PM 03/02/2022   11:32 AM 02/24/2021   11:42 AM 02/19/2020   11:25 AM  6CIT Screen  What Year? 0 points 0 points 0 points 0 points  What month? 0 points 0 points 0 points 0 points  What time? 0 points 0 points 0 points 0 points  Count back from 20 0 points 0 points 2 points 0 points  Months in reverse 2 points 0 points 2 points 0 points  Repeat phrase 4 points 0 points 0 points 2 points  Total Score 6 points 0 points 4 points 2 points    Immunizations Immunization History  Administered Date(s) Administered   Fluad Quad(high Dose 65+) 09/08/2019, 09/07/2020, 10/18/2021, 10/17/2022   Influenza, High Dose Seasonal PF 08/30/2017, 09/09/2018   Influenza,inj,Quad PF,6+ Mos 08/31/2014, 09/30/2015, 08/03/2016    Influenza,inj,quad, With Preservative 09/17/2018   Influenza-Unspecified 09/17/2012, 09/17/2013   PFIZER Comirnaty(Gray Top)Covid-19 Tri-Sucrose Vaccine 10/24/2022   PFIZER(Purple Top)SARS-COV-2 Vaccination 01/09/2020, 01/30/2020, 03/31/2020, 08/24/2020, 11/21/2021   Pfizer Covid-19 Vaccine Bivalent Booster 72yrs & up 03/31/2021   Pneumococcal Conjugate-13 11/26/2014, 11/27/2014   Pneumococcal Polysaccharide-23 12/03/2007   Td 12/03/2007   Tdap 12/26/2017   Zoster Recombinat (Shingrix) 06/01/2017, 11/14/2017   Zoster, Live 03/15/2013    TDAP status: Up to date  Flu Vaccine status: Up to date  Pneumococcal vaccine status: Up to date  Covid-19 vaccine status: Information provided on how to obtain vaccines.   Qualifies for Shingles Vaccine? Yes   Zostavax completed Yes   Shingrix Completed?: Yes  Screening Tests Health Maintenance  Topic Date Due   COVID-19 Vaccine (8 - 2023-24 season) 12/19/2022   OPHTHALMOLOGY EXAM  02/08/2023  FOOT EXAM  06/30/2023   HEMOGLOBIN A1C  07/02/2023   Diabetic kidney evaluation - eGFR measurement  01/02/2024   Diabetic kidney evaluation - Urine ACR  01/05/2024   Medicare Annual Wellness (AWV)  03/05/2024   DTaP/Tdap/Td (3 - Td or Tdap) 12/27/2027   Pneumonia Vaccine 24+ Years old  Completed   INFLUENZA VACCINE  Completed   Zoster Vaccines- Shingrix  Completed   HPV VACCINES  Aged Out    Health Maintenance  Health Maintenance Due  Topic Date Due   COVID-19 Vaccine (8 - 2023-24 season) 12/19/2022   OPHTHALMOLOGY EXAM  02/08/2023    Colorectal cancer screening: No longer required.   Lung Cancer Screening: (Low Dose CT Chest recommended if Age 50-80 years, 30 pack-year currently smoking OR have quit w/in 15years.) does not qualify.   Lung Cancer Screening Referral: No   Additional Screening:  Hepatitis C Screening: does not qualify; Completed No   Vision Screening: Recommended annual ophthalmology exams for early detection of  glaucoma and other disorders of the eye. Is the patient up to date with their annual eye exam?  No  Who is the provider or what is the name of the office in which the patient attends annual eye exams? Has upcoming appointment  If pt is not established with a provider, would they like to be referred to a provider to establish care? No .   Dental Screening: Recommended annual dental exams for proper oral hygiene  Community Resource Referral / Chronic Care Management: CRR required this visit?  No   CCM required this visit?  No      Plan:     I have personally reviewed and noted the following in the patient's chart:   Medical and social history Use of alcohol, tobacco or illicit drugs  Current medications and supplements including opioid prescriptions. Patient is not currently taking opioid prescriptions. Functional ability and status Nutritional status Physical activity Advanced directives List of other physicians Hospitalizations, surgeries, and ER visits in previous 12 months Vitals Screenings to include cognitive, depression, and falls Referrals and appointments  In addition, I have reviewed and discussed with patient certain preventive protocols, quality metrics, and best practice recommendations. A written personalized care plan for preventive services as well as general preventive health recommendations were provided to patient.     Sandrea Hughs, NP   03/06/2023   Nurse Notes: Up to date

## 2023-04-16 ENCOUNTER — Ambulatory Visit: Payer: Medicare HMO | Admitting: Podiatry

## 2023-04-16 DIAGNOSIS — B351 Tinea unguium: Secondary | ICD-10-CM | POA: Diagnosis not present

## 2023-04-16 DIAGNOSIS — M79674 Pain in right toe(s): Secondary | ICD-10-CM | POA: Diagnosis not present

## 2023-04-16 DIAGNOSIS — M79675 Pain in left toe(s): Secondary | ICD-10-CM

## 2023-04-18 NOTE — Progress Notes (Signed)
Subjective: Chief Complaint  Patient presents with   Nail Problem    Thick painful toenails, 3 month follow up    82 year old male presents the office today with his wife for follow-up evaluation of nail fungus.  He has been on Lamisil without any side effects.  He has seen improvement in the nails.  They do cause discomfort the fibula.  Objective: AAO x3, NAD DP/PT pulses palpable bilaterally, CRT less than 3 seconds Nails continue to be hypertrophic, dystrophic with yellow, brown discoloration.  He does get tenderness in the nails with nail growing out causing discomfort and nails 1-5 bilaterally.  There is clear along the proximal one half of the toenails and the nails appear to be growing out.  No edema, erythema.  No pain with calf compression, swelling, warmth, erythema  Assessment: Symptomatic onychomycosis  Plan: -All treatment options discussed with the patient including all alternatives, risks, complications.  -I sharply debrided nails without any complications or bleeding.  Patient the nails are growing out.  Will continue to monitor.  If they do not grow out or there is any regression will restart Lamisil. -Patient encouraged to call the office with any questions, concerns, change in symptoms.    Return in about 3 months (around 07/16/2023).  Vivi Barrack DPM

## 2023-05-01 ENCOUNTER — Other Ambulatory Visit: Payer: Self-pay | Admitting: Orthopedic Surgery

## 2023-05-24 ENCOUNTER — Other Ambulatory Visit: Payer: Self-pay | Admitting: Neurological Surgery

## 2023-05-28 NOTE — Pre-Procedure Instructions (Signed)
Surgical Instructions    Your procedure is scheduled on June 01, 2023.  Report to [facility] Main Entrance "A" at 11:20 A.M., then check in with the Admitting office.  Call this number if you have problems the morning of surgery:  [phone]   If you have any questions prior to your surgery date call [phone]: Open Monday-Friday 8am-4pm If you experience any cold or flu symptoms such as cough, fever, chills, shortness of breath, etc. between now and your scheduled surgery, please notify us at the above number     Remember:  Do not eat after midnight the night before your surgery  You may drink clear liquids until 10:20AM the morning of your surgery.   Clear liquids allowed are: Water, Non-Citrus Juices (without pulp), Carbonated Beverages, Clear Tea, Black Coffee ONLY (NO MILK, CREAM OR POWDERED CREAMER of any kind), and Gatorade    Take these medicines the morning of surgery with A SIP OF WATER:  atorvastatin (LIPITOR)   Follow your surgeon's instructions on when to stop Aspirin.  If no instructions were given by your surgeon then you will need to call the office to get those instructions.    As of today, STOP taking any Aleve, Naproxen, Ibuprofen, Motrin, Advil, Goody's, BC's, all herbal medications, fish oil, and all vitamins.  WHAT DO I DO ABOUT MY DIABETES MEDICATION?   Do not take oral diabetes medicines (pills) the morning of surgery. DO NOT take Metformin (Glucophage) the day of surgery.     HOW TO MANAGE YOUR DIABETES BEFORE AND AFTER SURGERY  Why is it important to control my blood sugar before and after surgery? Improving blood sugar levels before and after surgery helps healing and can limit problems. A way of improving blood sugar control is eating a healthy diet by:  Eating less sugar and carbohydrates  Increasing activity/exercise  Talking with your doctor about reaching your blood sugar goals High blood sugars (greater than 180 mg/dL) can raise your risk  of infections and slow your recovery, so you will need to focus on controlling your diabetes during the weeks before surgery. Make sure that the doctor who takes care of your diabetes knows about your planned surgery including the date and location.  How do I manage my blood sugar before surgery? Check your blood sugar at least 4 times a day, starting 2 days before surgery, to make sure that the level is not too high or low.  Check your blood sugar the morning of your surgery when you wake up and every 2 hours until you get to the Short Stay unit.  If your blood sugar is less than 70 mg/dL, you will need to treat for low blood sugar: Do not take insulin. Treat a low blood sugar (less than 70 mg/dL) with  cup of clear juice (cranberry or apple), 4 glucose tablets, OR glucose gel. Recheck blood sugar in 15 minutes after treatment (to make sure it is greater than 70 mg/dL). If your blood sugar is not greater than 70 mg/dL on recheck, call [phone] for further instructions. Report your blood sugar to the short stay nurse when you get to Short Stay.  If you are admitted to the hospital after surgery: Your blood sugar will be checked by the staff and you will probably be given insulin after surgery (instead of oral diabetes medicines) to make sure you have good blood sugar levels. The goal for blood sugar control after surgery is 80-180 mg/dL.  [place] [facility] is not responsible for any belongings or valuables.    Do NOT Smoke (Tobacco/Vaping)  24 hours prior to your procedure  If you use a CPAP at night, you may bring your mask for your overnight stay.   Contacts, glasses, hearing aids, dentures or partials may not be worn into surgery, please bring cases for these belongings   For patients admitted to the hospital, discharge time will be determined by your treatment team.   Patients discharged the day of surgery will not be allowed to drive home, and someone needs to stay with  them for 24 hours.   SURGICAL WAITING ROOM VISITATION Patients having surgery or a procedure may have no more than 2 support people in the waiting area - these visitors may rotate.   Children under the age of 72 must have an adult with them who is not the patient. If the patient needs to stay at the hospital during part of their recovery, the visitor guidelines for inpatient rooms apply. Pre-op nurse will coordinate an appropriate time for 1 support person to accompany patient in pre-op.  This support person may not rotate.   Please refer to [email or web address] for the visitor guidelines for Inpatients (after your surgery is over and you are in a regular room).    Special instructions:    Oral Hygiene is also important to reduce your risk of infection.  Remember - BRUSH YOUR TEETH THE MORNING OF SURGERY WITH YOUR REGULAR TOOTHPASTE     Pre-operative 5 CHG Bath Instructions   You can play a key role in reducing the risk of infection after surgery. Your skin needs to be as free of germs as possible. You can reduce the number of germs on your skin by washing with CHG (chlorhexidine gluconate) soap before surgery. CHG is an antiseptic soap that kills germs and continues to kill germs even after washing.   DO NOT use if you have an allergy to chlorhexidine/CHG or antibacterial soaps. If your skin becomes reddened or irritated, stop using the CHG and notify one of our RNs at [phone].   Please shower with the CHG soap starting 4 days before surgery using the following schedule:     Please keep in mind the following:  DO NOT shave, including legs and underarms, starting the day of your first shower.   You may shave your face at any point before/day of surgery.  Place clean sheets on your bed the day you start using CHG soap. Use a clean washcloth (not used since being washed) for each shower. DO NOT sleep with pets once you start using  the CHG.   CHG Shower Instructions:  If you choose to wash your hair and private area, wash first with your normal shampoo/soap.  After you use shampoo/soap, rinse your hair and body thoroughly to remove shampoo/soap residue.  Turn the water OFF and apply about 3 tablespoons (45 ml) of CHG soap to a CLEAN washcloth.  Apply CHG soap ONLY FROM YOUR NECK DOWN TO YOUR TOES (washing for 3-5 minutes)  DO NOT use CHG soap on face, private areas, open wounds, or sores.  Pay special attention to the area where your surgery is being performed.  If you are having back surgery, having someone wash your back for you may be helpful. Wait 2 minutes after CHG soap is applied, then you may rinse off the CHG soap.  Pat dry with a clean towel  Put on clean clothes/pajamas   If you  choose to wear lotion, please use ONLY the CHG-compatible lotions on the back of this paper.     Additional instructions for the day of surgery: DO NOT APPLY any lotions, deodorants, cologne, or perfumes.   Put on clean/comfortable clothes.  Brush your teeth.  Ask your nurse before applying any prescription medications to the skin. Do not wear jewelry or makeup.  Men may shave face. Do not bring valuables to the hospital. Do not wear nail polish, gel polish, artificial nails, or any other type of covering on natural nails (fingers and toes) If you have artificial nails or gel coating that need to be removed by a nail salon, please have this removed prior to surgery. Artificial nails or gel coating may interfere with anesthesia's ability to adequately monitor your vital signs.   Remember to brush your teeth WITH YOUR REGULAR TOOTHPASTE.     CHG Compatible Lotions   Aveeno Moisturizing lotion  Cetaphil Moisturizing Cream  Cetaphil Moisturizing Lotion  Clairol Herbal Essence Moisturizing Lotion, Dry Skin  Clairol Herbal Essence Moisturizing Lotion, Extra Dry Skin  Clairol Herbal Essence Moisturizing Lotion, Normal Skin   Curel Age Defying Therapeutic Moisturizing Lotion with Alpha Hydroxy  Curel Extreme Care Body Lotion  Curel Soothing Hands Moisturizing Hand Lotion  Curel Therapeutic Moisturizing Cream, Fragrance-Free  Curel Therapeutic Moisturizing Lotion, Fragrance-Free  Curel Therapeutic Moisturizing Lotion, Original Formula  Eucerin Daily Replenishing Lotion  Eucerin Dry Skin Therapy Plus Alpha Hydroxy Crme  Eucerin Dry Skin Therapy Plus Alpha Hydroxy Lotion  Eucerin Original Crme  Eucerin Original Lotion  Eucerin Plus Crme Eucerin Plus Lotion  Eucerin TriLipid Replenishing Lotion  Keri Anti-Bacterial Hand Lotion  Keri Deep Conditioning Original Lotion Dry Skin Formula Softly Scented  Keri Deep Conditioning Original Lotion, Fragrance Free Sensitive Skin Formula  Keri Lotion Fast Absorbing Fragrance Free Sensitive Skin Formula  Keri Lotion Fast Absorbing Softly Scented Dry Skin Formula  Keri Original Lotion  Keri Skin Renewal Lotion Keri Silky Smooth Lotion  Keri Silky Smooth Sensitive Skin Lotion  Nivea Body Creamy Conditioning Oil  Nivea Body Extra Enriched Lotion  Nivea Body Original Lotion  Nivea Body Sheer Moisturizing Lotion Nivea Crme  Nivea Skin Firming Lotion  NutraDerm 30 Skin Lotion  NutraDerm Skin Lotion  NutraDerm Therapeutic Skin Cream  NutraDerm Therapeutic Skin Lotion  ProShield Protective Hand Cream  Provon moisturizing lotion          If you received a COVID test during your pre-op visit, it is requested that you wear a mask when out in public, stay away from anyone that may not be feeling well, and notify your surgeon if you develop symptoms. If you have been in contact with anyone that has tested positive in the last 10 days, please notify your surgeon.    Please read over the following fact sheets that you were given.

## 2023-05-29 ENCOUNTER — Other Ambulatory Visit: Payer: Self-pay

## 2023-05-29 ENCOUNTER — Encounter (HOSPITAL_COMMUNITY)
Admission: RE | Admit: 2023-05-29 | Discharge: 2023-05-29 | Disposition: A | Discharge status: Home / Self Care | Payer: Medicare HMO | Source: Ambulatory Visit | Attending: Neurological Surgery | Admitting: Neurological Surgery

## 2023-05-29 ENCOUNTER — Encounter (HOSPITAL_COMMUNITY): Payer: Self-pay

## 2023-05-29 VITALS — BP 126/56 | HR 68 | Temp 97.9°F | Resp 16 | Ht 65.0 in | Wt 137.9 lb

## 2023-05-29 DIAGNOSIS — M4802 Spinal stenosis, cervical region: Secondary | ICD-10-CM | POA: Insufficient documentation

## 2023-05-29 DIAGNOSIS — M5117 Intervertebral disc disorders with radiculopathy, lumbosacral region: Secondary | ICD-10-CM | POA: Insufficient documentation

## 2023-05-29 DIAGNOSIS — I10 Essential (primary) hypertension: Secondary | ICD-10-CM | POA: Insufficient documentation

## 2023-05-29 DIAGNOSIS — M4712 Other spondylosis with myelopathy, cervical region: Secondary | ICD-10-CM | POA: Diagnosis not present

## 2023-05-29 DIAGNOSIS — M5001 Cervical disc disorder with myelopathy,  high cervical region: Secondary | ICD-10-CM | POA: Insufficient documentation

## 2023-05-29 DIAGNOSIS — E785 Hyperlipidemia, unspecified: Secondary | ICD-10-CM | POA: Insufficient documentation

## 2023-05-29 DIAGNOSIS — Z01818 Encounter for other preprocedural examination: Secondary | ICD-10-CM | POA: Diagnosis present

## 2023-05-29 DIAGNOSIS — E1142 Type 2 diabetes mellitus with diabetic polyneuropathy: Secondary | ICD-10-CM | POA: Insufficient documentation

## 2023-05-29 DIAGNOSIS — Z87891 Personal history of nicotine dependence: Secondary | ICD-10-CM | POA: Diagnosis not present

## 2023-05-29 DIAGNOSIS — E039 Hypothyroidism, unspecified: Secondary | ICD-10-CM | POA: Diagnosis not present

## 2023-05-29 LAB — BASIC METABOLIC PANEL
Anion gap: 10 (ref 5–15)
BUN: 11 mg/dL (ref 8–23)
CO2: 29 mmol/L (ref 22–32)
Calcium: 9.7 mg/dL (ref 8.9–10.3)
Chloride: 100 mmol/L (ref 98–111)
Creatinine, Ser: 1.12 mg/dL (ref 0.61–1.24)
GFR, Estimated: >60 mL/min (ref >60)
Glucose, Bld: 89 mg/dL (ref 70–99)
Potassium: 4.2 mmol/L (ref 3.5–5.1)
Sodium: 139 mmol/L (ref 135–145)

## 2023-05-29 LAB — CBC
HCT: 46.7 % (ref 39.0–52.0)
Hemoglobin: 15.4 g/dL (ref 13.0–17.0)
MCH: 30.1 pg (ref 26.0–34.0)
MCHC: 33 g/dL (ref 30.0–36.0)
MCV: 91.4 fL (ref 80.0–100.0)
Platelets: 404 10*3/uL — ABNORMAL HIGH (ref 150–400)
RBC: 5.11 MIL/uL (ref 4.22–5.81)
RDW: 11.9 % (ref 11.5–15.5)
WBC: 10.3 10*3/uL (ref 4.0–10.5)
nRBC: 0 % (ref 0.0–0.2)

## 2023-05-29 LAB — SURGICAL PCR SCREEN
MRSA, PCR: NEGATIVE
Staphylococcus aureus: POSITIVE — AB

## 2023-05-29 LAB — PROTIME-INR
INR: 1.1 (ref 0.8–1.2)
Prothrombin Time: 14.5 seconds (ref 11.4–15.2)

## 2023-05-29 LAB — HEMOGLOBIN A1C
Hgb A1c MFr Bld: 5.7 % — ABNORMAL HIGH (ref 4.8–5.6)
Mean Plasma Glucose: 116.89 mg/dL

## 2023-05-29 LAB — GLUCOSE, CAPILLARY: Glucose-Capillary: 116 mg/dL — ABNORMAL HIGH (ref 70–99)

## 2023-05-29 NOTE — Progress Notes (Addendum)
PCP - [name], NP Cardiologist - Denies  PPM/ICD - Denies Device Orders -  Rep Notified -   Chest x-ray - N/A EKG - 05/29/23 Stress Test - Denies ECHO - Denies Cardiac Cath - Denies  Sleep Study - No OSA  Dm - Borderline per wife CBG @ PAT 116 at lunch at 1215 Fasting Blood Sugar -  Checks Blood Sugar __None  Last dose of GLP1 agonist-  None GLP1 instructions: N/A  Blood Thinner Instructions:N/A Aspirin Instructions:Per wife stopped last Friday the 7th  ERAS Protcol -Yes  COVID TEST- N/A   Anesthesia review: Yes abnormal EKG   Patient denies shortness of breath, fever, cough and chest pain at PAT appointment   All instructions explained to the patient, with a verbal understanding of the material. Patient agrees to go over the instructions while at home for a better understanding.  The opportunity to ask questions was provided.

## 2023-05-30 NOTE — Anesthesia Preprocedure Evaluation (Addendum)
Anesthesia Evaluation  Patient identified by MRN, date of birth, ID band Patient awake    Reviewed: Allergy & Precautions, NPO status , Patient's Chart, lab work & pertinent test results  Airway Mallampati: II  TM Distance: >3 FB Neck ROM: Full    Dental  (+) Missing   Pulmonary former smoker   breath sounds clear to auscultation       Cardiovascular hypertension, Pt. on medications  Rhythm:Regular Rate:Normal     Neuro/Psych  Neuromuscular disease    GI/Hepatic negative GI ROS, Neg liver ROS,,,  Endo/Other  diabetes, Type 2, Oral Hypoglycemic Agents    Renal/GU negative Renal ROS     Musculoskeletal   Abdominal   Peds  Hematology negative hematology ROS (+)   Anesthesia Other Findings   Reproductive/Obstetrics                             Anesthesia Physical Anesthesia Plan  ASA: 2  Anesthesia Plan: General   Post-op Pain Management: Tylenol PO (pre-op)* and Gabapentin PO (pre-op)*   Induction: Intravenous  PONV Risk Score and Plan: 2 and Dexamethasone, Ondansetron and Treatment may vary due to age or medical condition  Airway Management Planned: Oral ETT  Additional Equipment:   Intra-op Plan:   Post-operative Plan: Extubation in OR  Informed Consent: I have reviewed the patients History and Physical, chart, labs and discussed the procedure including the risks, benefits and alternatives for the proposed anesthesia with the patient or authorized representative who has indicated his/her understanding and acceptance.     Dental advisory given  Plan Discussed with: CRNA  Anesthesia Plan Comments: (  )       Anesthesia Quick Evaluation

## 2023-05-30 NOTE — Progress Notes (Signed)
Anesthesia Chart Review:  Case: [id number] Date/Time: 06/01/23 1208   Procedure: ACDF - C5-C6 - C6-C7 - 3C   Anesthesia type: General   Pre-op diagnosis: Cervical myelopathy   [facility] ROOM 19 / [facility] OR   Surgeons: Arman Bogus, MD       DISCUSSION: Patient is an 82 year old male scheduled for the above procedure.  History includes former smoker (quit 01/02/66), HTN, DM2, HLD, hypothyroidism, spinal surgery (L4-5 TLIF 03/03/10).  Anesthesia team to evaluate on the day of surgery.  Reported last aspirin 05/25/2023.   VS: BP (!) 126/56   Pulse 68   Temp 36.6 C   Resp 16   Ht 5\' 5"  (1.651 m)   Wt 62.6 kg   SpO2 100%   BMI 22.95 kg/m   PROVIDERS: [place], Amy E, NP is PCP    LABS: Labs reviewed: Acceptable for surgery. (all labs ordered are listed, but only abnormal results are displayed)  Labs Reviewed  SURGICAL PCR SCREEN - Abnormal; Notable for the following components:      Result Value   Staphylococcus aureus POSITIVE (*)    All other components within normal limits  GLUCOSE, CAPILLARY - Abnormal; Notable for the following components:   Glucose-Capillary 116 (*)    All other components within normal limits  HEMOGLOBIN A1C - Abnormal; Notable for the following components:   Hgb A1c MFr Bld 5.7 (*)    All other components within normal limits  CBC - Abnormal; Notable for the following components:   Platelets 404 (*)    All other components within normal limits  PROTIME-INR  BASIC METABOLIC PANEL    IMAGES: CT C/L Spine 10/03/22: IMPRESSION: CERVICAL SPINE:   1. 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 Bilateral foraminal stenosis at C5-6 and C6-7 that could affect either C6 or C7 nerve. Note that the stenosis is functionally more significant with the extension of the neck. At myelography, in the extended position, there was essentially a complete block to the passage of contrast. By bending the head forward, contrast  moved above those levels. 2. Facet arthropathy throughout the cervical region as outlined above. 3. C3-4: Disc bulge and uncovertebral prominence. Facet arthropathy left worse than right. Left foraminal narrowing that could affect the left C4 nerve. 4. C4-5: Disc bulge and uncovertebral prominence. Facet arthropathy. Foraminal narrowing that could affect either C5 nerve.   LUMBAR SPINE:   1. Previous discectomy and fusion procedure at L4-5 with solid union of the posterior elements on the right. Fixed anterolisthesis of 3 mm. Chronic bony foraminal narrowing, left more than right. 2. Beginning immediately below the fusion level extending to the upper S1 level, there is marked constriction of the thecal sac with abundant epidural fat. 3. L5-S1: Shallow protrusion of the disc, adjacent to the S1 nerves but without visible S1 nerve compression. Bilateral facet arthropathy. Severe bilateral foraminal stenosis that could compress either or both L5 nerves.   EKG: 05/29/23: Normal sinus rhythm Confirmed by Steffanie Dunn [phone]) on 05/29/2023 9:50:07 PM   CV: N/A   Past Medical History:  Diagnosis Date   Abdominal pain, other specified site    Backache, unspecified    Carpal tunnel syndrome    Diabetes mellitus without complication (HCC)    Essential hypertension, benign    H/O hand surgery 02/25/2020   R hand    Hyperlipidemia    Impacted cerumen    Impaired fasting glucose    Other abnormal blood  chemistry    Pain in joint, lower leg    Special screening for malignant neoplasm of prostate    Unspecified essential hypertension    Unspecified hypothyroidism     Past Surgical History:  Procedure Laterality Date   (L) KNEE SURGERY"TORN MENISCUS" Left 09/30/2008   BACK SURGERY  03/03/2010   fusion   DG HAND RIGHT COMPLETE ([facility] HX) Right 12/25/2022   EYE SURGERY Left 11/24/2014   cataracts   HAND SURGERY Right 02/25/2020   hand surgery Right 12/25/2022   removed 2  growths, benign   hemorhoid     banding of rectal hemorrhoid   SHOULDER SURGERY Right 10/06/2019   TONSILLECTOMY  1953    MEDICATIONS:  Ascorbic Acid (VITAMIN C) 1000 MG tablet   aspirin 81 MG tablet   atorvastatin (LIPITOR) 40 MG tablet   b complex vitamins capsule   gabapentin (NEURONTIN) 100 MG capsule   latanoprost (XALATAN) 0.005 % ophthalmic solution   lisinopril-hydrochlorothiazide (ZESTORETIC) 20-12.5 MG tablet   metFORMIN (GLUCOPHAGE) 500 MG tablet   Multiple Vitamins-Minerals (MULTIVITAMIN & MINERAL PO)   naproxen sodium (ALEVE) 220 MG tablet   sildenafil (VIAGRA) 50 MG tablet   terbinafine (LAMISIL) 250 MG tablet   No current facility-administered medications for this encounter.    Shonna Chock, PA-C Surgical Short Stay/Anesthesiology [facility] Phone [phone] [facility] Phone [phone] 05/30/2023 11:07 AM

## 2023-05-31 NOTE — Progress Notes (Signed)
Spoke with Tyler Hull, he will arrive tom at 1030. NPO post mn and to stop clear liquids at 0920.

## 2023-06-01 ENCOUNTER — Encounter (HOSPITAL_COMMUNITY): Payer: Self-pay | Admitting: Neurological Surgery

## 2023-06-01 ENCOUNTER — Ambulatory Visit (HOSPITAL_COMMUNITY)
Admission: RE | Disposition: A | Discharge status: Home / Self Care | Payer: Self-pay | Source: Home / Self Care | Attending: Neurological Surgery

## 2023-06-01 ENCOUNTER — Observation Stay (HOSPITAL_COMMUNITY)
Admission: RE | Admit: 2023-06-01 | Discharge: 2023-06-02 | Disposition: A | Discharge status: Home / Self Care | Payer: Medicare HMO | Attending: Neurological Surgery | Admitting: Neurological Surgery

## 2023-06-01 ENCOUNTER — Other Ambulatory Visit: Payer: Self-pay

## 2023-06-01 ENCOUNTER — Ambulatory Visit (HOSPITAL_COMMUNITY): Payer: Medicare HMO | Admitting: Vascular Surgery

## 2023-06-01 ENCOUNTER — Ambulatory Visit (HOSPITAL_BASED_OUTPATIENT_CLINIC_OR_DEPARTMENT_OTHER): Payer: Medicare HMO | Admitting: Certified Registered"

## 2023-06-01 ENCOUNTER — Ambulatory Visit (HOSPITAL_COMMUNITY): Payer: Medicare HMO

## 2023-06-01 DIAGNOSIS — Z7982 Long term (current) use of aspirin: Secondary | ICD-10-CM | POA: Insufficient documentation

## 2023-06-01 DIAGNOSIS — I10 Essential (primary) hypertension: Secondary | ICD-10-CM | POA: Insufficient documentation

## 2023-06-01 DIAGNOSIS — E039 Hypothyroidism, unspecified: Secondary | ICD-10-CM | POA: Diagnosis not present

## 2023-06-01 DIAGNOSIS — M4722 Other spondylosis with radiculopathy, cervical region: Principal | ICD-10-CM | POA: Insufficient documentation

## 2023-06-01 DIAGNOSIS — E119 Type 2 diabetes mellitus without complications: Secondary | ICD-10-CM | POA: Diagnosis not present

## 2023-06-01 DIAGNOSIS — Z7984 Long term (current) use of oral hypoglycemic drugs: Secondary | ICD-10-CM | POA: Diagnosis not present

## 2023-06-01 DIAGNOSIS — Z79899 Other long term (current) drug therapy: Secondary | ICD-10-CM | POA: Insufficient documentation

## 2023-06-01 DIAGNOSIS — E1149 Type 2 diabetes mellitus with other diabetic neurological complication: Secondary | ICD-10-CM

## 2023-06-01 DIAGNOSIS — E1142 Type 2 diabetes mellitus with diabetic polyneuropathy: Secondary | ICD-10-CM

## 2023-06-01 DIAGNOSIS — Z87891 Personal history of nicotine dependence: Secondary | ICD-10-CM | POA: Insufficient documentation

## 2023-06-01 DIAGNOSIS — M4802 Spinal stenosis, cervical region: Secondary | ICD-10-CM

## 2023-06-01 DIAGNOSIS — Z981 Arthrodesis status: Secondary | ICD-10-CM

## 2023-06-01 HISTORY — PX: ANTERIOR CERVICAL DECOMP/DISCECTOMY FUSION: SHX1161

## 2023-06-01 LAB — GLUCOSE, CAPILLARY
Glucose-Capillary: 93 mg/dL (ref 70–99)
Glucose-Capillary: 94 mg/dL (ref 70–99)
Glucose-Capillary: 100 mg/dL — ABNORMAL HIGH (ref 70–99)
Glucose-Capillary: 174 mg/dL — ABNORMAL HIGH (ref 70–99)
Glucose-Capillary: 193 mg/dL — ABNORMAL HIGH (ref 70–99)

## 2023-06-01 SURGERY — ANTERIOR CERVICAL DECOMPRESSION/DISCECTOMY FUSION 2 LEVELS
Anesthesia: General

## 2023-06-01 MED ORDER — CEFAZOLIN SODIUM 1 G IJ SOLR
INTRAMUSCULAR | Status: AC
  Filled 2023-06-01: qty 20

## 2023-06-01 MED ORDER — SODIUM CHLORIDE 0.9% FLUSH: 3.0000 mL | Freq: Two times a day (BID) | INTRAVENOUS | Status: DC

## 2023-06-01 MED ORDER — PHENYLEPHRINE 80 MCG/ML (10ML) SYRINGE FOR IV PUSH (FOR BLOOD PRESSURE SUPPORT)
PREFILLED_SYRINGE | INTRAVENOUS | Status: AC
  Filled 2023-06-01: qty 10

## 2023-06-01 MED ORDER — ROCURONIUM BROMIDE 10 MG/ML (PF) SYRINGE
PREFILLED_SYRINGE | INTRAVENOUS | Status: AC
  Filled 2023-06-01: qty 10

## 2023-06-01 MED ORDER — ADULT MULTIVITAMIN LIQUID CH
15.0000 mL | Freq: Every day | ORAL | Status: DC
  Filled 2023-06-01: qty 15

## 2023-06-01 MED ORDER — VANCOMYCIN HCL IN DEXTROSE 1-5 GM/200ML-% IV SOLN
1000.0000 mg | INTRAVENOUS | Status: AC
  Administered 2023-06-01: 1000 mg via INTRAVENOUS
  Filled 2023-06-01: qty 200

## 2023-06-01 MED ORDER — AMISULPRIDE (ANTIEMETIC) 5 MG/2ML IV SOLN: 10.0000 mg | Freq: Once | INTRAVENOUS | Status: DC | PRN

## 2023-06-01 MED ORDER — EPHEDRINE SULFATE (PRESSORS) 50 MG/ML IJ SOLN
INTRAMUSCULAR | Status: DC | PRN
  Administered 2023-06-01: 10 mg via INTRAVENOUS

## 2023-06-01 MED ORDER — ONDANSETRON HCL 4 MG PO TABS: 4.0000 mg | ORAL_TABLET | Freq: Four times a day (QID) | ORAL | Status: DC | PRN

## 2023-06-01 MED ORDER — MUPIROCIN 2 % EX OINT
1.0000 | TOPICAL_OINTMENT | Freq: Two times a day (BID) | CUTANEOUS | Status: DC
  Administered 2023-06-01 (×2): 1 via NASAL

## 2023-06-01 MED ORDER — METFORMIN HCL 500 MG PO TABS
500.0000 mg | ORAL_TABLET | Freq: Two times a day (BID) | ORAL | Status: DC
  Administered 2023-06-02: 500 mg via ORAL
  Filled 2023-06-01: qty 1

## 2023-06-01 MED ORDER — INSULIN ASPART 100 UNIT/ML IJ SOLN
0.0000 [IU] | Freq: Three times a day (TID) | INTRAMUSCULAR | Status: DC
  Administered 2023-06-02: 2 [IU] via SUBCUTANEOUS

## 2023-06-01 MED ORDER — THROMBIN 5000 UNITS EX SOLR
CUTANEOUS | Status: AC
  Filled 2023-06-01: qty 15000

## 2023-06-01 MED ORDER — INSULIN ASPART 100 UNIT/ML IJ SOLN
0.0000 [IU] | Freq: Three times a day (TID) | INTRAMUSCULAR | Status: DC
  Administered 2023-06-01: 3 [IU] via SUBCUTANEOUS

## 2023-06-01 MED ORDER — MENTHOL 3 MG MT LOZG: 1.0000 | LOZENGE | OROMUCOSAL | Status: DC | PRN

## 2023-06-01 MED ORDER — ONDANSETRON HCL 4 MG/2ML IJ SOLN
INTRAMUSCULAR | Status: AC
  Filled 2023-06-01: qty 2

## 2023-06-01 MED ORDER — PROPOFOL 10 MG/ML IV BOLUS
INTRAVENOUS | Status: DC | PRN
  Administered 2023-06-01: 130 mg via INTRAVENOUS

## 2023-06-01 MED ORDER — SENNA 8.6 MG PO TABS
1.0000 | ORAL_TABLET | Freq: Two times a day (BID) | ORAL | Status: DC
  Administered 2023-06-01: 8.6 mg via ORAL
  Filled 2023-06-01: qty 1

## 2023-06-01 MED ORDER — MUPIROCIN 2 % EX OINT
TOPICAL_OINTMENT | CUTANEOUS | Status: AC
  Filled 2023-06-01: qty 22

## 2023-06-01 MED ORDER — POTASSIUM CHLORIDE IN NACL 20-0.9 MEQ/L-% IV SOLN: INTRAVENOUS | Status: DC

## 2023-06-01 MED ORDER — FENTANYL CITRATE (PF) 100 MCG/2ML IJ SOLN: 25.0000 ug | INTRAMUSCULAR | Status: DC | PRN

## 2023-06-01 MED ORDER — BUPIVACAINE HCL (PF) 0.25 % IJ SOLN
INTRAMUSCULAR | Status: AC
  Filled 2023-06-01: qty 30

## 2023-06-01 MED ORDER — HYDROCODONE-ACETAMINOPHEN 5-325 MG PO TABS
1.0000 | ORAL_TABLET | ORAL | Status: DC | PRN
  Administered 2023-06-01 – 2023-06-02 (×2): 2 via ORAL
  Filled 2023-06-01 (×2): qty 2

## 2023-06-01 MED ORDER — CEFAZOLIN SODIUM-DEXTROSE 2-3 GM-%(50ML) IV SOLR
INTRAVENOUS | Status: DC | PRN
  Administered 2023-06-01: 2 g via INTRAVENOUS

## 2023-06-01 MED ORDER — BUPIVACAINE HCL (PF) 0.25 % IJ SOLN
INTRAMUSCULAR | Status: DC | PRN
  Administered 2023-06-01: 4 mL

## 2023-06-01 MED ORDER — 0.9 % SODIUM CHLORIDE (POUR BTL) OPTIME
TOPICAL | Status: DC | PRN
  Administered 2023-06-01: 1000 mL

## 2023-06-01 MED ORDER — LACTATED RINGERS IV SOLN: INTRAVENOUS | Status: DC

## 2023-06-01 MED ORDER — CHLORHEXIDINE GLUCONATE 0.12 % MT SOLN
15.0000 mL | Freq: Once | OROMUCOSAL | Status: AC
  Administered 2023-06-01: 15 mL via OROMUCOSAL
  Filled 2023-06-01: qty 15

## 2023-06-01 MED ORDER — THROMBIN 5000 UNITS EX SOLR
OROMUCOSAL | Status: DC | PRN
  Administered 2023-06-01: 5 mL via TOPICAL

## 2023-06-01 MED ORDER — PROPOFOL 10 MG/ML IV BOLUS
INTRAVENOUS | Status: AC
  Filled 2023-06-01: qty 20

## 2023-06-01 MED ORDER — EPHEDRINE 5 MG/ML INJ
INTRAVENOUS | Status: AC
  Filled 2023-06-01: qty 5

## 2023-06-01 MED ORDER — HYDROCODONE-ACETAMINOPHEN 5-325 MG PO TABS
2.0000 | ORAL_TABLET | ORAL | Status: DC | PRN
  Administered 2023-06-01: 2 via ORAL
  Filled 2023-06-01: qty 2

## 2023-06-01 MED ORDER — CEFAZOLIN SODIUM-DEXTROSE 2-4 GM/100ML-% IV SOLN
2.0000 g | Freq: Three times a day (TID) | INTRAVENOUS | Status: AC
  Administered 2023-06-01 – 2023-06-02 (×2): 2 g via INTRAVENOUS
  Filled 2023-06-01 (×2): qty 100

## 2023-06-01 MED ORDER — FENTANYL CITRATE (PF) 250 MCG/5ML IJ SOLN
INTRAMUSCULAR | Status: AC
  Filled 2023-06-01: qty 5

## 2023-06-01 MED ORDER — PHENYLEPHRINE 80 MCG/ML (10ML) SYRINGE FOR IV PUSH (FOR BLOOD PRESSURE SUPPORT)
PREFILLED_SYRINGE | INTRAVENOUS | Status: DC | PRN
  Administered 2023-06-01: 80 ug via INTRAVENOUS

## 2023-06-01 MED ORDER — LIDOCAINE 2% (20 MG/ML) 5 ML SYRINGE
INTRAMUSCULAR | Status: DC | PRN
  Administered 2023-06-01: 60 mg via INTRAVENOUS

## 2023-06-01 MED ORDER — PHENOL 1.4 % MT LIQD
1.0000 | OROMUCOSAL | Status: DC | PRN
  Filled 2023-06-01: qty 177

## 2023-06-01 MED ORDER — GABAPENTIN 300 MG PO CAPS
300.0000 mg | ORAL_CAPSULE | ORAL | Status: AC
  Administered 2023-06-01: 300 mg via ORAL
  Filled 2023-06-01: qty 1

## 2023-06-01 MED ORDER — THROMBIN (RECOMBINANT) 5000 UNITS EX SOLR
CUTANEOUS | Status: DC | PRN
  Administered 2023-06-01: 10 mL via TOPICAL

## 2023-06-01 MED ORDER — DEXAMETHASONE 4 MG PO TABS
4.0000 mg | ORAL_TABLET | Freq: Four times a day (QID) | ORAL | Status: DC
  Administered 2023-06-01 – 2023-06-02 (×3): 4 mg via ORAL
  Filled 2023-06-01 (×3): qty 1

## 2023-06-01 MED ORDER — LIDOCAINE 2% (20 MG/ML) 5 ML SYRINGE
INTRAMUSCULAR | Status: AC
  Filled 2023-06-01: qty 5

## 2023-06-01 MED ORDER — ACETAMINOPHEN 325 MG PO TABS: 650.0000 mg | ORAL_TABLET | ORAL | Status: DC | PRN

## 2023-06-01 MED ORDER — ACETAMINOPHEN 650 MG RE SUPP: 650.0000 mg | RECTAL | Status: DC | PRN

## 2023-06-01 MED ORDER — LISINOPRIL 20 MG PO TABS: 20.0000 mg | ORAL_TABLET | Freq: Every day | ORAL | Status: DC

## 2023-06-01 MED ORDER — SUGAMMADEX SODIUM 200 MG/2ML IV SOLN
INTRAVENOUS | Status: DC | PRN
  Administered 2023-06-01: 150 mg via INTRAVENOUS

## 2023-06-01 MED ORDER — MORPHINE SULFATE (PF) 2 MG/ML IV SOLN
2.0000 mg | INTRAVENOUS | Status: DC | PRN
  Administered 2023-06-01: 2 mg via INTRAVENOUS
  Filled 2023-06-01: qty 1

## 2023-06-01 MED ORDER — ONDANSETRON HCL 4 MG/2ML IJ SOLN: 4.0000 mg | Freq: Four times a day (QID) | INTRAMUSCULAR | Status: DC | PRN

## 2023-06-01 MED ORDER — METHOCARBAMOL 1000 MG/10ML IJ SOLN: 500.0000 mg | Freq: Four times a day (QID) | INTRAVENOUS | Status: DC | PRN

## 2023-06-01 MED ORDER — LATANOPROST 0.005 % OP SOLN
1.0000 [drp] | Freq: Every day | OPHTHALMIC | Status: DC
  Filled 2023-06-01: qty 2.5

## 2023-06-01 MED ORDER — PHENYLEPHRINE HCL-NACL 20-0.9 MG/250ML-% IV SOLN
INTRAVENOUS | Status: DC | PRN
  Administered 2023-06-01: 50 ug/min via INTRAVENOUS

## 2023-06-01 MED ORDER — CHLORHEXIDINE GLUCONATE CLOTH 2 % EX PADS: 6.0000 | MEDICATED_PAD | Freq: Once | CUTANEOUS | Status: DC

## 2023-06-01 MED ORDER — ACETAMINOPHEN 500 MG PO TABS
1000.0000 mg | ORAL_TABLET | ORAL | Status: AC
  Administered 2023-06-01: 1000 mg via ORAL
  Filled 2023-06-01: qty 2

## 2023-06-01 MED ORDER — DEXAMETHASONE SODIUM PHOSPHATE 10 MG/ML IJ SOLN
INTRAMUSCULAR | Status: AC
  Filled 2023-06-01: qty 1

## 2023-06-01 MED ORDER — ORAL CARE MOUTH RINSE: 15.0000 mL | Freq: Once | OROMUCOSAL | Status: AC

## 2023-06-01 MED ORDER — SODIUM CHLORIDE 0.9 % IV SOLN: 250.0000 mL | INTRAVENOUS | Status: DC

## 2023-06-01 MED ORDER — DEXAMETHASONE SODIUM PHOSPHATE 4 MG/ML IJ SOLN: 4.0000 mg | Freq: Four times a day (QID) | INTRAMUSCULAR | Status: DC

## 2023-06-01 MED ORDER — METHOCARBAMOL 500 MG PO TABS
500.0000 mg | ORAL_TABLET | Freq: Four times a day (QID) | ORAL | Status: DC | PRN
  Administered 2023-06-01 – 2023-06-02 (×2): 500 mg via ORAL
  Filled 2023-06-01 (×3): qty 1

## 2023-06-01 MED ORDER — DEXAMETHASONE SODIUM PHOSPHATE 10 MG/ML IJ SOLN
INTRAMUSCULAR | Status: DC | PRN
  Administered 2023-06-01: 5 mg via INTRAVENOUS

## 2023-06-01 MED ORDER — FENTANYL CITRATE (PF) 250 MCG/5ML IJ SOLN
INTRAMUSCULAR | Status: DC | PRN
  Administered 2023-06-01: 100 ug via INTRAVENOUS
  Administered 2023-06-01: 50 ug via INTRAVENOUS

## 2023-06-01 MED ORDER — ACETAMINOPHEN 500 MG PO TABS
ORAL_TABLET | ORAL | Status: AC
  Filled 2023-06-01: qty 2

## 2023-06-01 MED ORDER — LISINOPRIL-HYDROCHLOROTHIAZIDE 20-12.5 MG PO TABS: 1.0000 | ORAL_TABLET | Freq: Every day | ORAL | Status: DC

## 2023-06-01 MED ORDER — ONDANSETRON HCL 4 MG/2ML IJ SOLN
INTRAMUSCULAR | Status: DC | PRN
  Administered 2023-06-01: 4 mg via INTRAVENOUS

## 2023-06-01 MED ORDER — ROCURONIUM BROMIDE 10 MG/ML (PF) SYRINGE
PREFILLED_SYRINGE | INTRAVENOUS | Status: DC | PRN
  Administered 2023-06-01: 20 mg via INTRAVENOUS
  Administered 2023-06-01: 40 mg via INTRAVENOUS

## 2023-06-01 MED ORDER — SODIUM CHLORIDE 0.9% FLUSH: 3.0000 mL | INTRAVENOUS | Status: DC | PRN

## 2023-06-01 MED ORDER — HYDROCHLOROTHIAZIDE 12.5 MG PO TABS: 12.5000 mg | ORAL_TABLET | Freq: Every day | ORAL | Status: DC

## 2023-06-01 SURGICAL SUPPLY — 49 items
APL SKNCLS STERI-STRIP NONHPOA (GAUZE/BANDAGES/DRESSINGS) ×1
BAG COUNTER SPONGE SURGICOUNT (BAG) ×1 IMPLANT
BAG SPNG CNTER NS LX DISP (BAG) ×2
BASKET BONE COLLECTION (BASKET) IMPLANT
BENZOIN TINCTURE PRP APPL 2/3 (GAUZE/BANDAGES/DRESSINGS) ×1 IMPLANT
BUR CARBIDE MATCH 3.0 (BURR) ×1 IMPLANT
CANISTER SUCT 3000ML PPV (MISCELLANEOUS) ×1 IMPLANT
DRAPE C-ARM 42X72 X-RAY (DRAPES) ×2 IMPLANT
DRAPE LAPAROTOMY 100X72 PEDS (DRAPES) ×1 IMPLANT
DRAPE MICROSCOPE SLANT 54X150 (MISCELLANEOUS) ×1 IMPLANT
DRSG OPSITE POSTOP 3X4 (GAUZE/BANDAGES/DRESSINGS) IMPLANT
DURAPREP 6ML APPLICATOR 50/CS (WOUND CARE) ×1 IMPLANT
ELECT COATED BLADE 2.86 ST (ELECTRODE) ×1 IMPLANT
ELECT REM PT RETURN 9FT ADLT (ELECTROSURGICAL) ×1
ELECTRODE REM PT RTRN 9FT ADLT (ELECTROSURGICAL) ×1 IMPLANT
GAUZE 4X4 16PLY ~~LOC~~+RFID DBL (SPONGE) IMPLANT
GLOVE BIO SURGEON STRL SZ7 (GLOVE) IMPLANT
GLOVE BIO SURGEON STRL SZ8 (GLOVE) ×1 IMPLANT
GLOVE BIOGEL PI IND STRL 7.0 (GLOVE) IMPLANT
GOWN STRL REUS W/ TWL LRG LVL3 (GOWN DISPOSABLE) IMPLANT
GOWN STRL REUS W/ TWL XL LVL3 (GOWN DISPOSABLE) ×1 IMPLANT
GOWN STRL REUS W/TWL 2XL LVL3 (GOWN DISPOSABLE) IMPLANT
GOWN STRL REUS W/TWL LRG LVL3 (GOWN DISPOSABLE) ×1
GOWN STRL REUS W/TWL XL LVL3 (GOWN DISPOSABLE) ×2
HEMOSTAT POWDER KIT SURGIFOAM (HEMOSTASIS) ×1 IMPLANT
KIT BASIN OR (CUSTOM PROCEDURE TRAY) ×1 IMPLANT
KIT TURNOVER KIT B (KITS) ×1 IMPLANT
NDL HYPO 25X1 1.5 SAFETY (NEEDLE) ×1 IMPLANT
NDL SPNL 20GX3.5 QUINCKE YW (NEEDLE) ×1 IMPLANT
NEEDLE HYPO 25X1 1.5 SAFETY (NEEDLE) ×1 IMPLANT
NEEDLE SPNL 20GX3.5 QUINCKE YW (NEEDLE) ×1 IMPLANT
NS IRRIG 1000ML POUR BTL (IV SOLUTION) ×1 IMPLANT
PACK LAMINECTOMY NEURO (CUSTOM PROCEDURE TRAY) ×1 IMPLANT
PAD ARMBOARD 7.5X6 YLW CONV (MISCELLANEOUS) ×3 IMPLANT
PIN DISTRACTION 14MM (PIN) ×2 IMPLANT
PLATE ANTC INSIG 40 2L (Plate) IMPLANT
SCREW VA SINGLE LEAD 4X14 (Screw) ×6 IMPLANT
SCREW VA SINGLE LEAD 4X14 ST (Screw) IMPLANT
SOL ELECTROSURG ANTI STICK (MISCELLANEOUS) ×1
SOLUTION ELECTROSURG ANTI STCK (MISCELLANEOUS) ×1 IMPLANT
SPACER ASSEM CERV LORD 7M (Spacer) IMPLANT
SPONGE INTESTINAL PEANUT (DISPOSABLE) ×1 IMPLANT
SPONGE SURGIFOAM ABS GEL SZ50 (HEMOSTASIS) ×1 IMPLANT
STRIP CLOSURE SKIN 1/2X4 (GAUZE/BANDAGES/DRESSINGS) ×1 IMPLANT
SUT VIC AB 3-0 SH 8-18 (SUTURE) ×2 IMPLANT
SUT VICRYL 4-0 PS2 18IN ABS (SUTURE) IMPLANT
TOWEL GREEN STERILE (TOWEL DISPOSABLE) ×1 IMPLANT
TOWEL GREEN STERILE FF (TOWEL DISPOSABLE) ×1 IMPLANT
WATER STERILE IRR 1000ML POUR (IV SOLUTION) ×1 IMPLANT

## 2023-06-01 NOTE — Transfer of Care (Signed)
Immediate Anesthesia Transfer of Care Note  Patient: Tyler Hull  Procedure(s) Performed: Anterior Cervical Decompression Fusion - Cervical five-Cervical six - Cervical six-Cervical seven  Patient Location: PACU  Anesthesia Type:General  Level of Consciousness: sedated  Airway & Oxygen Therapy: Patient Spontanous Breathing and Patient connected to nasal cannula oxygen  Post-op Assessment: Report given to RN and Post -op Vital signs reviewed and stable  Post vital signs: Reviewed and stable  Last Vitals:  Vitals Value Taken Time  BP 145/62 06/01/23 1527  Temp    Pulse 90 06/01/23 1528  Resp 13 06/01/23 1528  SpO2 100 % 06/01/23 1528  Vitals shown include unvalidated device data.  Last Pain:  Vitals:   06/01/23 1039  TempSrc:   PainSc: 8          Complications: No notable events documented.

## 2023-06-01 NOTE — H&P (Signed)
Subjective:   Patient is a 82 y.o. male admitted for cervical spinal stenosis. The patient first presented to me with complaints of neck pain, shooting pains in the arm(s), and numbness of the arm(s). Onset of symptoms was several years ago. The pain is described as aching and occurs all day. The pain is rated moderate, and is located in the neck and radiates to the left upper extremity. The symptoms have been progressive. Symptoms are exacerbated by extending head backwards, and are relieved by none.  Previous work up includes MRI of cervical spine, results: spinal stenosis.  Past Medical History:  Diagnosis Date   Abdominal pain, other specified site    Backache, unspecified    Carpal tunnel syndrome    Diabetes mellitus without complication (HCC)    Essential hypertension, benign    H/O hand surgery 02/25/2020   R hand    Hyperlipidemia    Impacted cerumen    Impaired fasting glucose    Other abnormal blood chemistry    Pain in joint, lower leg    Special screening for malignant neoplasm of prostate    Unspecified essential hypertension    Unspecified hypothyroidism     Past Surgical History:  Procedure Laterality Date   (L) KNEE SURGERY"TORN MENISCUS" Left 09/30/2008   BACK SURGERY  03/03/2010   fusion   DG HAND RIGHT COMPLETE ([facility] HX) Right 12/25/2022   EYE SURGERY Left 11/24/2014   cataracts   HAND SURGERY Right 02/25/2020   hand surgery Right 12/25/2022   removed 2 growths, benign   hemorhoid     banding of rectal hemorrhoid   SHOULDER SURGERY Right 10/06/2019   TONSILLECTOMY  1953    Allergies  Allergen Reactions   Penicillins Swelling   Shellfish Allergy Swelling    Social History   Tobacco Use   Smoking status: Former    Years: 13    Types: Cigarettes    Quit date: 01/02/1966    Years since quitting: 57.4   Smokeless tobacco: Never   Tobacco comments:    Quit at age 82   Substance Use Topics   Alcohol use: No    Family History  Problem Relation  Age of Onset   Alzheimer's disease Sister    Cancer Brother 51       lung   Diabetes Sister    Diabetes Sister    Lung cancer Sister 10       mets to back   Kidney disease Sister    Allergies Sister    Obesity Sister    Cancer Mother    Prior to Admission medications   Medication Sig Start Date End Date Taking? Authorizing Provider  Ascorbic Acid (VITAMIN C) 1000 MG tablet Take 1,000 mg by mouth daily.   Yes [provider]  aspirin 81 MG tablet Take 81 mg by mouth daily.   Yes [provider]  atorvastatin (LIPITOR) 40 MG tablet TAKE 1 TABLET DAILY TO LOWER CHOLESTEROL 05/01/23  Yes Fargo, Amy E, NP  b complex vitamins capsule Take 1 capsule by mouth daily.   Yes [provider]  latanoprost (XALATAN) 0.005 % ophthalmic solution Place 1 drop into both eyes at bedtime. 11/01/14  Yes [provider]  lisinopril-hydrochlorothiazide (ZESTORETIC) 20-12.5 MG tablet TAKE 1 TABLET DAILY 12/25/22  Yes Fargo, Amy E, NP  metFORMIN (GLUCOPHAGE) 500 MG tablet TAKE 1 TABLET IN THE MORNING AND ONE-HALF (1/2) TABLET IN THE EVENING 07/17/22  Yes Fargo, Amy E, NP  Multiple Vitamins-Minerals (  MULTIVITAMIN & MINERAL PO) Take 1 tablet by mouth daily.   Yes [provider]  naproxen sodium (ALEVE) 220 MG tablet Take 220 mg by mouth daily as needed (pain).   Yes [provider]  sildenafil (VIAGRA) 50 MG tablet TAKE 1 TABLET AS NEEDED FOR ERECTILE DYSFUNCTION 07/17/22  Yes Fargo, Amy E, NP  gabapentin (NEURONTIN) 100 MG capsule Take 1 capsule (100 mg total) by mouth 2 (two) times daily. Patient not taking: Reported on 05/25/2023 08/14/22   Octavia Heir, NP  terbinafine (LAMISIL) 250 MG tablet Take 1 tablet (250 mg total) by mouth daily. Patient not taking: Reported on 05/25/2023 10/27/22   Vivi Barrack, DPM     Review of Systems  Positive ROS: neg  All other systems have been reviewed and were otherwise negative with the exception of those mentioned in  the HPI and as above.  Objective: Vital signs in last 24 hours: Temp:  [98 F (36.7 C)] 98 F (36.7 C) (06/14 1034) Pulse Rate:  [79] 79 (06/14 1034) Resp:  [18] 18 (06/14 1034) BP: (135)/(83) 135/83 (06/14 1034) SpO2:  [97 %] 97 % (06/14 1034) Weight:  [62.6 kg] 62.6 kg (06/14 1034)  General Appearance: Alert, cooperative, no distress, appears stated age Head: Normocephalic, without obvious abnormality, atraumatic Eyes: PERRL, conjunctiva/corneas clear, EOM's intact      Neck: Supple, symmetrical, trachea midline, Back: Symmetric, no curvature, ROM normal, no CVA tenderness Lungs:  respirations unlabored Heart: Regular rate and rhythm Abdomen: Soft, non-tender Extremities: Extremities normal, atraumatic, no cyanosis or edema Pulses: 2+ and symmetric all extremities Skin: Skin color, texture, turgor normal, no rashes or lesions  NEUROLOGIC:  Mental status: Alert and oriented x4, no aphasia, good attention span, fund of knowledge and memory  Motor Exam - grossly normal Sensory Exam - grossly normal Reflexes: 1+ Coordination - grossly normal Gait - grossly normal Balance - grossly normal Cranial Nerves: I: smell Not tested  II: visual acuity  OS: nl    OD: nl  II: visual fields Full to confrontation  II: pupils Equal, round, reactive to light  III,VII: ptosis None  III,IV,VI: extraocular muscles  Full ROM  V: mastication Normal  V: facial light touch sensation  Normal  V,VII: corneal reflex  Present  VII: facial muscle function - upper  Normal  VII: facial muscle function - lower Normal  VIII: hearing Not tested  IX: soft palate elevation  Normal  IX,X: gag reflex Present  XI: trapezius strength  5/5  XI: sternocleidomastoid strength 5/5  XI: neck flexion strength  5/5  XII: tongue strength  Normal    Data Review Lab Results  Component Value Date   WBC 10.3 05/29/2023   HGB 15.4 05/29/2023   HCT 46.7 05/29/2023   MCV 91.4 05/29/2023   PLT 404 (H) 05/29/2023    Lab Results  Component Value Date   NA 139 05/29/2023   K 4.2 05/29/2023   CL 100 05/29/2023   CO2 29 05/29/2023   BUN 11 05/29/2023   CREATININE 1.12 05/29/2023   GLUCOSE 89 05/29/2023   Lab Results  Component Value Date   INR 1.1 05/29/2023    Assessment:   Cervical neck pain with herniated nucleus pulposus/ spondylosis/ stenosis at C5-6 and C6-7. Estimated body mass index is 22.62 kg/m as calculated from the following:   Height as of this encounter: 5' 5.5" (1.664 m).   Weight as of this encounter: 62.6 kg.  Patient has failed conservative therapy. Planned  surgery : ACDF with plate Z6-1 W9-6  Plan:   I explained the condition and procedure to the patient and answered any questions.  Patient wishes to proceed with procedure as planned. Understands risks/ benefits/ and expected or typical outcomes.  Tia Alert 06/01/2023 12:47 PM

## 2023-06-01 NOTE — Op Note (Signed)
06/01/2023  3:09 PM  PATIENT:  Tyler Hull  82 y.o. 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 Decompressive anterior cervical discectomy C5-6 C6-7, 2. Anterior cervical arthrodesis C5-6 C6-7 utilizing a structural allograft bone graft, 3. Anterior cervical plating C5-C7 inclusive utilizing a Alphatec plate  SURGEON:  Marikay Alar, MD  ASSISTANTS: Verlin Dike, FNP  ANESTHESIA:   General  EBL: 10 ml  Total I/O In: 1400 [I.V.:1400] Out: 10 [Blood:10]  BLOOD ADMINISTERED: none  DRAINS: none  SPECIMEN:  none  INDICATION FOR PROCEDURE: This patient presented with neck pain and left upper extremity pain. Imaging showed spinal stenosis C5-6 C6-7. The patient tried conservative measures without relief. Pain was debilitating. Recommended ACDF with plating. Patient understood the risks, benefits, and alternatives and potential outcomes and wished to proceed.  PROCEDURE DETAILS: Patient was brought to the operating room placed under general endotracheal anesthesia. Patient was placed in the supine position on the operating room table. The neck was prepped with Duraprep and draped in a sterile fashion.   Three cc of local anesthesia was injected and a transverse incision was made on the right side of the neck.  Dissection was carried down thru the subcutaneous tissue and the platysma was  elevated, opened, and undermined with Metzenbaum scissors.  Dissection was then carried out thru an avascular plane leaving the sternocleidomastoid carotid artery and jugular vein laterally and the trachea and esophagus medially with the assistance of my nurse practitioner. The ventral aspect of the vertebral column was identified and a localizing x-ray was taken. The C5-6 and C6-7 level was identified and all in the room agreed with the level. The longus colli  muscles were then elevated and the retractor was placed with the assistance of my nurse practitioner. The annulus was incised and the disc space entered. Discectomy was performed with micro-curettes and pituitary rongeurs. I then used the high-speed drill to drill the endplates down to the level of the posterior longitudinal ligament.  The operating microscope was draped and brought into the field provided additional magnification, illumination and visualization. Discectomy was continued posteriorly thru the disc space. Posterior longitudinal ligament was opened with a nerve hook, and then removed along with disc herniation and osteophytes, decompressing the spinal canal and thecal sac. We then continued to remove osteophytic overgrowth and disc material decompressing the neural foramina and exiting nerve roots bilaterally. The scope was angled up and down to help decompress and undercut the vertebral bodies. Once the decompression was completed we could pass a nerve hook circumferentially to assure adequate decompression in the midline and in the neural foramina. So by both visualization and palpation we felt we had an adequate decompression of the neural elements. We then measured the height of the intravertebral disc space and selected a 7 millimeter structural allograft for each level. It was then gently positioned in the intravertebral disc space(s) and countersunk. I then used a 40 mm Alphatec plate and placed 14 mm variable angle screws into the vertebral bodies of each level and locked them into position. The wound was irrigated with bacitracin solution, checked for hemostasis which was established and confirmed. Once meticulous hemostasis was achieved, we then proceeded with closure with the assistance of my nurse practitioner. The platysma was closed with interrupted 3-0 undyed Vicryl suture, the subcuticular layer was closed with interrupted 3-0 undyed Vicryl suture. The skin edges were approximated  with  steristrips. The drapes were removed. A sterile dressing was applied. The patient was then awakened from general anesthesia and transferred to the recovery room in stable condition. At the end of the procedure all sponge, needle and instrument counts were correct.   PLAN OF CARE: Admit for overnight observation  PATIENT DISPOSITION:  PACU - hemodynamically stable.   Delay start of Pharmacological VTE agent (>24hrs) due to surgical blood loss or risk of bleeding:  yes

## 2023-06-02 DIAGNOSIS — M4722 Other spondylosis with radiculopathy, cervical region: Secondary | ICD-10-CM | POA: Diagnosis not present

## 2023-06-02 LAB — GLUCOSE, CAPILLARY: Glucose-Capillary: 133 mg/dL — ABNORMAL HIGH (ref 70–99)

## 2023-06-02 MED ORDER — HYDROCODONE-ACETAMINOPHEN 5-325 MG PO TABS: 1.0000 | ORAL_TABLET | ORAL | 0 refills | Status: DC | PRN

## 2023-06-02 MED ORDER — TIZANIDINE HCL 4 MG PO TABS: 4.0000 mg | ORAL_TABLET | Freq: Four times a day (QID) | ORAL | 0 refills | Status: DC | PRN

## 2023-06-02 NOTE — Plan of Care (Signed)
Problem: Education: Goal: Knowledge of General Education information will improve Description: Including pain rating scale, medication(s)/side effects and non-pharmacologic comfort measures Outcome: Adequate for Discharge   Problem: Health Behavior/Discharge Planning: Goal: Ability to manage health-related needs will improve Outcome: Adequate for Discharge   Problem: Clinical Measurements: Goal: Ability to maintain clinical measurements within normal limits will improve Outcome: Adequate for Discharge Goal: Will remain free from infection Outcome: Adequate for Discharge Goal: Diagnostic test results will improve Outcome: Adequate for Discharge Goal: Respiratory complications will improve Outcome: Adequate for Discharge Goal: Cardiovascular complication will be avoided Outcome: Adequate for Discharge   Problem: Activity: Goal: Risk for activity intolerance will decrease Outcome: Adequate for Discharge   Problem: Nutrition: Goal: Adequate nutrition will be maintained Outcome: Adequate for Discharge   Problem: Coping: Goal: Level of anxiety will decrease Outcome: Adequate for Discharge   Problem: Elimination: Goal: Will not experience complications related to bowel motility Outcome: Adequate for Discharge Goal: Will not experience complications related to urinary retention Outcome: Adequate for Discharge   Problem: Pain Managment: Goal: General experience of comfort will improve Outcome: Adequate for Discharge   Problem: Safety: Goal: Ability to remain free from injury will improve Outcome: Adequate for Discharge   Problem: Skin Integrity: Goal: Risk for impaired skin integrity will decrease Outcome: Adequate for Discharge   Problem: Education: Goal: Ability to describe self-care measures that may prevent or decrease complications (Diabetes Survival Skills Education) will improve Outcome: Adequate for Discharge Goal: Individualized Educational Video(s) Outcome:  Adequate for Discharge   Problem: Coping: Goal: Ability to adjust to condition or change in health will improve Outcome: Adequate for Discharge   Problem: Fluid Volume: Goal: Ability to maintain a balanced intake and output will improve Outcome: Adequate for Discharge   Problem: Health Behavior/Discharge Planning: Goal: Ability to identify and utilize available resources and services will improve Outcome: Adequate for Discharge Goal: Ability to manage health-related needs will improve Outcome: Adequate for Discharge   Problem: Metabolic: Goal: Ability to maintain appropriate glucose levels will improve Outcome: Adequate for Discharge   Problem: Nutritional: Goal: Maintenance of adequate nutrition will improve Outcome: Adequate for Discharge Goal: Progress toward achieving an optimal weight will improve Outcome: Adequate for Discharge   Problem: Skin Integrity: Goal: Risk for impaired skin integrity will decrease Outcome: Adequate for Discharge   Problem: Tissue Perfusion: Goal: Adequacy of tissue perfusion will improve Outcome: Adequate for Discharge   Problem: Education: Goal: Ability to verbalize activity precautions or restrictions will improve Outcome: Adequate for Discharge Goal: Knowledge of the prescribed therapeutic regimen will improve Outcome: Adequate for Discharge Goal: Understanding of discharge needs will improve Outcome: Adequate for Discharge   Problem: Activity: Goal: Ability to avoid complications of mobility impairment will improve Outcome: Adequate for Discharge Goal: Ability to tolerate increased activity will improve Outcome: Adequate for Discharge Goal: Will remain free from falls Outcome: Adequate for Discharge   Problem: Bowel/Gastric: Goal: Gastrointestinal status for postoperative course will improve Outcome: Adequate for Discharge   Problem: Clinical Measurements: Goal: Ability to maintain clinical measurements within normal limits  will improve Outcome: Adequate for Discharge Goal: Postoperative complications will be avoided or minimized Outcome: Adequate for Discharge Goal: Diagnostic test results will improve Outcome: Adequate for Discharge   Problem: Pain Management: Goal: Pain level will decrease Outcome: Adequate for Discharge   Problem: Skin Integrity: Goal: Will show signs of wound healing Outcome: Adequate for Discharge   Problem: Health Behavior/Discharge Planning: Goal: Identification of resources available to assist in meeting health care   needs will improve Outcome: Adequate for Discharge   Problem: Bladder/Genitourinary: Goal: Urinary functional status for postoperative course will improve Outcome: Adequate for Discharge   

## 2023-06-02 NOTE — Discharge Instructions (Signed)

## 2023-06-02 NOTE — Discharge Summary (Signed)
  Physician Discharge Summary  Patient ID: Tyler Hull MRN: [id number] DOB/AGE: 1941-04-02 82 y.o.  Admit date: 06/01/2023 Discharge date: 06/02/2023  Admission Diagnoses:  Discharge Diagnoses:  Principal Problem:   S/P cervical spinal fusion   Discharged Condition: good  Hospital Course: Patient mated to hospital where he underwent uncomplicated two-level anterior cervical decompression and fusion surgery for treatment of his compressive cervical myelopathy postoperative doing reasonably well.  Preoperative neck and upper extremity radicular and myelopathic symptoms improved.  Standing ambulating and voiding without difficulty.  Ready for discharge home.  Consults:   Significant Diagnostic Studies:   Treatments:   Discharge Exam: Blood pressure 139/67, pulse 68, temperature 98.1 F (36.7 C), temperature source Oral, resp. rate 19, height 5' 5.5" (1.664 m), weight 62.6 kg, SpO2 97 %. Awake and alert.  And appropriate.  Motor and sensory function stable with some moderate bilateral spastic weakness.'s wound healing well.  Swallowing well.  Voice strong.  Chest and abdomen benign. Disposition: Discharge disposition: 01-Home or Self Care        Allergies as of 06/02/2023       Reactions   Penicillins Swelling   Shellfish Allergy Swelling        Medication List     TAKE these medications    aspirin 81 MG tablet Take 81 mg by mouth daily.   atorvastatin 40 MG tablet Commonly known as: LIPITOR TAKE 1 TABLET DAILY TO LOWER CHOLESTEROL   b complex vitamins capsule Take 1 capsule by mouth daily.   gabapentin 100 MG capsule Commonly known as: NEURONTIN Take 1 capsule (100 mg total) by mouth 2 (two) times daily.   HYDROcodone-acetaminophen 5-325 MG tablet Commonly known as: NORCO/VICODIN Take 1-2 tablets by mouth every 4 (four) hours as needed for severe pain ((score 7 to 10)).   latanoprost 0.005 % ophthalmic solution Commonly known as: XALATAN Place 1  drop into both eyes at bedtime.   lisinopril-hydrochlorothiazide 20-12.5 MG tablet Commonly known as: ZESTORETIC TAKE 1 TABLET DAILY   metFORMIN 500 MG tablet Commonly known as: GLUCOPHAGE TAKE 1 TABLET IN THE MORNING AND ONE-HALF (1/2) TABLET IN THE EVENING   MULTIVITAMIN & MINERAL PO Take 1 tablet by mouth daily.   naproxen sodium 220 MG tablet Commonly known as: ALEVE Take 220 mg by mouth daily as needed (pain).   sildenafil 50 MG tablet Commonly known as: VIAGRA TAKE 1 TABLET AS NEEDED FOR ERECTILE DYSFUNCTION   terbinafine 250 MG tablet Commonly known as: LamISIL Take 1 tablet (250 mg total) by mouth daily.   tiZANidine 4 MG tablet Commonly known as: ZANAFLEX Take 1 tablet (4 mg total) by mouth every 6 (six) hours as needed for muscle spasms.   vitamin C 1000 MG tablet Take 1,000 mg by mouth daily.         Signed: Kathaleen Maser Tyler Hull 06/02/2023, 10:01 AM

## 2023-06-02 NOTE — Progress Notes (Signed)
Explained discharge instructions to patient. Reviewed follow up appointment and next medication administration times. Also reviewed post surgical education. Patient verbalized having an understanding for instructions given. All belongings are in the patient's possession. IV was removed by the nightshift RN. No other needs verbalized.

## 2023-06-02 NOTE — Anesthesia Postprocedure Evaluation (Signed)
Anesthesia Post Note  Patient: Tyler Hull  Procedure(s) Performed: Anterior Cervical Decompression Fusion - Cervical five-Cervical six - Cervical six-Cervical seven     Patient location during evaluation: PACU Anesthesia Type: General Level of consciousness: awake and alert Pain management: pain level controlled Vital Signs Assessment: post-procedure vital signs reviewed and stable Respiratory status: spontaneous breathing, nonlabored ventilation, respiratory function stable and patient connected to nasal cannula oxygen Cardiovascular status: blood pressure returned to baseline and stable Postop Assessment: no apparent nausea or vomiting Anesthetic complications: no   No notable events documented.  Last Vitals:  Vitals:   06/02/23 0311 06/02/23 0714  BP: (!) 173/57 139/67  Pulse: 70 68  Resp: 18 19  Temp: 36.7 C 36.7 C  SpO2: 98% 97%    Last Pain:  Vitals:   06/02/23 0714  TempSrc: Oral  PainSc:                  Kennieth Rad

## 2023-06-02 NOTE — Care Management (Signed)
Patient with order to DC to home today. Unit staff to provide DME needed for home.   No HH needs identified Patient will have family/ friends provide transportation home. No other TOC needs identified for DC 

## 2023-06-04 MED FILL — Thrombin For Soln 5000 Unit: CUTANEOUS | Qty: 2 | Status: AC

## 2023-06-05 ENCOUNTER — Encounter (HOSPITAL_COMMUNITY): Payer: Self-pay | Admitting: Neurological Surgery

## 2023-06-12 ENCOUNTER — Other Ambulatory Visit: Payer: Self-pay

## 2023-06-12 DIAGNOSIS — R7303 Prediabetes: Secondary | ICD-10-CM

## 2023-06-12 DIAGNOSIS — E1169 Type 2 diabetes mellitus with other specified complication: Secondary | ICD-10-CM

## 2023-06-12 DIAGNOSIS — I10 Essential (primary) hypertension: Secondary | ICD-10-CM

## 2023-07-09 ENCOUNTER — Other Ambulatory Visit: Payer: Medicare HMO

## 2023-07-09 LAB — CBC WITH DIFFERENTIAL/PLATELET
Basophils Absolute: 49 cells/uL (ref 0–200)
Eosinophils Absolute: 389 cells/uL (ref 15–500)
Eosinophils Relative: 4.8 %
Lymphs Abs: 1920 cells/uL (ref 850–3900)
MCHC: 33.3 g/dL (ref 32.0–36.0)
MCV: 92.3 fL (ref 80.0–100.0)
Monocytes Relative: 7.7 %
Neutro Abs: 5119 cells/uL (ref 1500–7800)
Neutrophils Relative %: 63.2 %
Platelets: 308 10*3/uL (ref 140–400)
RDW: 12 % (ref 11.0–15.0)

## 2023-07-10 ENCOUNTER — Other Ambulatory Visit: Payer: Self-pay | Admitting: Orthopedic Surgery

## 2023-07-10 LAB — COMPLETE METABOLIC PANEL WITHOUT GFR
AG Ratio: 1.9 (calc) (ref 1.0–2.5)
ALT: 17 U/L (ref 9–46)
AST: 17 U/L (ref 10–35)
Albumin: 4.4 g/dL (ref 3.6–5.1)
Alkaline phosphatase (APISO): 71 U/L (ref 35–144)
BUN: 17 mg/dL (ref 7–25)
CO2: 27 mmol/L (ref 20–32)
Calcium: 9.6 mg/dL (ref 8.6–10.3)
Chloride: 103 mmol/L (ref 98–110)
Creat: 0.85 mg/dL (ref 0.70–1.22)
Globulin: 2.3 g/dL (ref 1.9–3.7)
Glucose, Bld: 91 mg/dL (ref 65–99)
Potassium: 3.9 mmol/L (ref 3.5–5.3)
Sodium: 139 mmol/L (ref 135–146)
Total Bilirubin: 0.6 mg/dL (ref 0.2–1.2)
Total Protein: 6.7 g/dL (ref 6.1–8.1)
eGFR: 88 mL/min/1.73m2

## 2023-07-10 LAB — HEMOGLOBIN A1C
Hgb A1c MFr Bld: 6 % of total Hgb — ABNORMAL HIGH (ref <5.7)
Mean Plasma Glucose: 126 mg/dL
eAG (mmol/L): 7 mmol/L

## 2023-07-10 LAB — LIPID PANEL
Cholesterol: 135 mg/dL
HDL: 48 mg/dL
LDL Cholesterol (Calc): 68 mg/dL
Non-HDL Cholesterol (Calc): 87 mg/dL
Total CHOL/HDL Ratio: 2.8 (calc)
Triglycerides: 100 mg/dL

## 2023-07-10 LAB — CBC WITH DIFFERENTIAL/PLATELET
Absolute Monocytes: 624 cells/uL (ref 200–950)
Basophils Relative: 0.6 %
HCT: 40.8 % (ref 38.5–50.0)
Hemoglobin: 13.6 g/dL (ref 13.2–17.1)
MCH: 30.8 pg (ref 27.0–33.0)
MPV: 11 fL (ref 7.5–12.5)
RBC: 4.42 10*6/uL (ref 4.20–5.80)
Total Lymphocyte: 23.7 %
WBC: 8.1 10*3/uL (ref 3.8–10.8)

## 2023-07-12 ENCOUNTER — Encounter: Payer: Self-pay | Admitting: Orthopedic Surgery

## 2023-07-12 ENCOUNTER — Ambulatory Visit: Payer: Medicare HMO | Admitting: Orthopedic Surgery

## 2023-07-12 VITALS — BP 122/64 | HR 84 | Temp 97.3°F | Resp 16 | Ht 65.5 in | Wt 139.2 lb

## 2023-07-12 DIAGNOSIS — E1169 Type 2 diabetes mellitus with other specified complication: Secondary | ICD-10-CM

## 2023-07-12 DIAGNOSIS — E785 Hyperlipidemia, unspecified: Secondary | ICD-10-CM

## 2023-07-12 DIAGNOSIS — Z981 Arthrodesis status: Secondary | ICD-10-CM | POA: Diagnosis not present

## 2023-07-12 DIAGNOSIS — I10 Essential (primary) hypertension: Secondary | ICD-10-CM | POA: Diagnosis not present

## 2023-07-12 DIAGNOSIS — E1142 Type 2 diabetes mellitus with diabetic polyneuropathy: Secondary | ICD-10-CM | POA: Diagnosis not present

## 2023-07-12 NOTE — Progress Notes (Signed)
Careteam: Patient Care Team: Octavia Heir, NP as PCP - General (Adult Health Nurse Practitioner) [name], MD as Consulting Physician (Ophthalmology) [name], MD as Consulting Physician (Gastroenterology)  Seen by: [name], AGNP-C  PLACE OF SERVICE:  [facility]  Advanced Directive information Does Patient Have a Medical Advance Directive?: Yes, Type of Advance Directive: Out of facility DNR (pink MOST or yellow form);Healthcare Power of Attorney, Does patient want to make changes to medical advance directive?: No - Patient declined  Allergies  Allergen Reactions   Penicillins Swelling   Shellfish Allergy Swelling    Chief Complaint  Patient presents with   Medical Management of Chronic Issues    6 month follow up.    Health Maintenance    Discuss the need for Eye exam, and Foot exam.    Immunizations    Discuss the need for Covid Booster. NCIR Verified.      HPI: Patient is a 82 y.o. male seen today for medical management of chronic conditions.   Labs reviewed with patient.   No health concerns.   A1c stable at 6> was 5.7. No hypoglycemia. Remains on Metformin, asa, ACE, statin. Eye exam a few months ago with Dr. Dione Booze note requested.   S/p cervical infusion C5-C6, C6-C7 by Dr. Yetta Barre. No numbness or pain down arms. Neck feeling stiff at times. Overall happy he had surgery.   No recent falls or injuries.     Review of Systems:  Review of Systems  Constitutional: Negative.   HENT: Negative.    Eyes: Negative.   Respiratory:  Negative for cough, shortness of breath and wheezing.   Cardiovascular:  Negative for chest pain and leg swelling.  Gastrointestinal: Negative.   Genitourinary: Negative.   Musculoskeletal:  Positive for neck pain. Negative for falls.  Skin: Negative.   Neurological: Negative.   Endo/Heme/Allergies:  Negative for polydipsia.  Psychiatric/Behavioral: Negative.      Past Medical History:  Diagnosis Date   Abdominal  pain, other specified site    Backache, unspecified    Carpal tunnel syndrome    Diabetes mellitus without complication (HCC)    Essential hypertension, benign    H/O hand surgery 02/25/2020   R hand    Hyperlipidemia    Impacted cerumen    Impaired fasting glucose    Other abnormal blood chemistry    Pain in joint, lower leg    Special screening for malignant neoplasm of prostate    Unspecified essential hypertension    Unspecified hypothyroidism    Past Surgical History:  Procedure Laterality Date   (L) KNEE SURGERY"TORN MENISCUS" Left 09/30/2008   ANTERIOR CERVICAL DECOMP/DISCECTOMY FUSION N/A 06/01/2023   Procedure: Anterior Cervical Decompression Fusion - Cervical five-Cervical six - Cervical six-Cervical seven;  Surgeon: Arman Bogus, MD;  Location: [facility] OR;  Service: Neurosurgery;  Laterality: N/A;   BACK SURGERY  03/03/2010   fusion   DG HAND RIGHT COMPLETE ([facility] HX) Right 12/25/2022   EYE SURGERY Left 11/24/2014   cataracts   HAND SURGERY Right 02/25/2020   hand surgery Right 12/25/2022   removed 2 growths, benign   hemorhoid     banding of rectal hemorrhoid   SHOULDER SURGERY Right 10/06/2019   TONSILLECTOMY  1953   Social History:   reports that he quit smoking about 57 years ago. His smoking use included cigarettes. He started smoking about 70 years ago. He has never used smokeless tobacco. He reports that he does not drink alcohol  and does not use drugs.  Family History  Problem Relation Age of Onset   Alzheimer's disease Sister    Cancer Brother 39       lung   Diabetes Sister    Diabetes Sister    Lung cancer Sister 48       mets to back   Kidney disease Sister    Allergies Sister    Obesity Sister    Cancer Mother     Medications: Patient's Medications  New Prescriptions   No medications on file  Previous Medications   ASCORBIC ACID (VITAMIN C) 1000 MG TABLET    Take 1,000 mg by mouth daily.   ASPIRIN 81 MG TABLET    Take 81 mg by mouth  daily.   ATORVASTATIN (LIPITOR) 40 MG TABLET    TAKE 1 TABLET DAILY TO LOWER CHOLESTEROL   B COMPLEX VITAMINS CAPSULE    Take 1 capsule by mouth daily.   LATANOPROST (XALATAN) 0.005 % OPHTHALMIC SOLUTION    Place 1 drop into both eyes at bedtime.   LISINOPRIL-HYDROCHLOROTHIAZIDE (ZESTORETIC) 20-12.5 MG TABLET    TAKE 1 TABLET DAILY   METFORMIN (GLUCOPHAGE) 500 MG TABLET    TAKE 1 TABLET IN THE MORNING AND ONE-HALF (1/2) TABLET IN THE EVENING   MULTIPLE VITAMINS-MINERALS (MULTIVITAMIN & MINERAL PO)    Take 1 tablet by mouth daily.   NAPROXEN SODIUM (ALEVE) 220 MG TABLET    Take 220 mg by mouth daily as needed (pain).   SILDENAFIL (VIAGRA) 50 MG TABLET    TAKE 1 TABLET AS NEEDED FOR ERECTILE DYSFUNCTION  Modified Medications   No medications on file  Discontinued Medications   GABAPENTIN (NEURONTIN) 100 MG CAPSULE    Take 1 capsule (100 mg total) by mouth 2 (two) times daily.   HYDROCODONE-ACETAMINOPHEN (NORCO/VICODIN) 5-325 MG TABLET    Take 1-2 tablets by mouth every 4 (four) hours as needed for severe pain ((score 7 to 10)).   TERBINAFINE (LAMISIL) 250 MG TABLET    Take 1 tablet (250 mg total) by mouth daily.   TIZANIDINE (ZANAFLEX) 4 MG TABLET    Take 1 tablet (4 mg total) by mouth every 6 (six) hours as needed for muscle spasms.    Physical Exam:  Vitals:   07/12/23 1023  BP: 122/64  Pulse: 84  Resp: 16  Temp: (!) 97.3 F (36.3 C)  SpO2: 96%  Weight: 139 lb 3.2 oz (63.1 kg)  Height: 5' 5.5" (1.664 m)   Body mass index is 22.81 kg/m. Wt Readings from Last 3 Encounters:  07/12/23 139 lb 3.2 oz (63.1 kg)  06/01/23 138 lb (62.6 kg)  05/29/23 137 lb 14.4 oz (62.6 kg)    Physical Exam Vitals reviewed.  Constitutional:      General: He is not in acute distress. HENT:     Head: Normocephalic.  Eyes:     General:        Right eye: No discharge.        Left eye: No discharge.  Cardiovascular:     Rate and Rhythm: Normal rate and regular rhythm.     Pulses: Normal pulses.      Heart sounds: Normal heart sounds.  Pulmonary:     Effort: Pulmonary effort is normal. No respiratory distress.     Breath sounds: Normal breath sounds. No wheezing.  Abdominal:     General: Bowel sounds are normal. There is no distension.     Tenderness: There is no abdominal tenderness.  Musculoskeletal:     Cervical back: Neck supple.     Right lower leg: No edema.     Left lower leg: No edema.  Skin:    General: Skin is warm and dry.  Neurological:     General: No focal deficit present.     Mental Status: He is alert and oriented to person, place, and time.     Motor: No weakness.     Gait: Gait normal.  Psychiatric:        Mood and Affect: Mood normal.     Labs reviewed: Basic Metabolic Panel: Recent Labs    01/01/23 0904 05/29/23 1333 07/09/23 0924  NA 139 139 139  K 4.2 4.2 3.9  CL 101 100 103  CO2 31 29 27   GLUCOSE 92 89 91  BUN 10 11 17   CREATININE 0.97 1.12 0.85  CALCIUM 9.8 9.7 9.6   Liver Function Tests: Recent Labs    11/24/22 0932 01/01/23 0904 01/30/23 1142 07/09/23 0924  AST 20 21 26 17   ALT 19 20 24 17   ALKPHOS 80  --  82  --   BILITOT 0.6 0.6 0.6 0.6  PROT 7.0 7.0 7.1 6.7  ALBUMIN 4.7  --  4.8  --    No results for input(s): "LIPASE", "AMYLASE" in the last 8760 hours. No results for input(s): "AMMONIA" in the last 8760 hours. CBC: Recent Labs    01/01/23 0904 01/30/23 1142 05/29/23 1333 07/09/23 0924  WBC 9.4 8.7 10.3 8.1  NEUTROABS 6,232 4.8  --  5,119  HGB 14.8 14.4 15.4 13.6  HCT 44.0 42.5 46.7 40.8  MCV 90.2 88 91.4 92.3  PLT 345 314 404* 308   Lipid Panel: Recent Labs    01/01/23 0904 07/09/23 0924  CHOL 123 135  HDL 46 48  LDLCALC 61 68  TRIG 79 100  CHOLHDL 2.7 2.8   TSH: No results for input(s): "TSH" in the last 8760 hours. A1C: Lab Results  Component Value Date   HGBA1C 6.0 (H) 07/09/2023     Assessment/Plan 1. Controlled type 2 diabetes mellitus with diabetic polyneuropathy, without long-term  current use of insulin (HCC) - A1c 6.0 (07/22)> was 5.7 - urine microalbumin normal 12/2022 - no hypoglycemias - eye exam earlier this year - cont metformin, asa, ACE and statin  2. Essential hypertension, benign - controlled - BUN/creat 17/0.85 07/09/2023 - cont lisinopril-HCTZ  3. S/P cervical spinal fusion - followed by Dr. Yetta Barre - 06/14 cervical fusion C5-C6, C6-C7  4. Hyperlipidemia associated with type 2 diabetes mellitus (HCC) - LDL 68, goal < 70 - cont statin   Total time: 31 minutes. Greater than 50% of total time spent doing patient education regarding health maintenance, T2DM, HTN, and neck pain including symptom/medication management.   Next appt: Visit date not found  Shanasia Ibrahim Scherry Ran  [facility] [phone]

## 2023-07-12 NOTE — Patient Instructions (Signed)
Please get flu vaccine before Nov 1st

## 2023-07-16 ENCOUNTER — Encounter: Payer: Self-pay | Admitting: Podiatry

## 2023-07-16 ENCOUNTER — Ambulatory Visit: Payer: Medicare HMO | Admitting: Podiatry

## 2023-07-16 DIAGNOSIS — M79675 Pain in left toe(s): Secondary | ICD-10-CM

## 2023-07-16 DIAGNOSIS — B351 Tinea unguium: Secondary | ICD-10-CM | POA: Diagnosis not present

## 2023-07-16 DIAGNOSIS — Z860100 Personal history of colon polyps, unspecified: Secondary | ICD-10-CM | POA: Insufficient documentation

## 2023-07-16 DIAGNOSIS — R195 Other fecal abnormalities: Secondary | ICD-10-CM | POA: Insufficient documentation

## 2023-07-16 DIAGNOSIS — K640 First degree hemorrhoids: Secondary | ICD-10-CM | POA: Insufficient documentation

## 2023-07-16 DIAGNOSIS — K625 Hemorrhage of anus and rectum: Secondary | ICD-10-CM | POA: Insufficient documentation

## 2023-07-16 DIAGNOSIS — K5900 Constipation, unspecified: Secondary | ICD-10-CM | POA: Insufficient documentation

## 2023-07-16 DIAGNOSIS — M5020 Other cervical disc displacement, unspecified cervical region: Secondary | ICD-10-CM | POA: Insufficient documentation

## 2023-07-16 DIAGNOSIS — M79674 Pain in right toe(s): Secondary | ICD-10-CM | POA: Diagnosis not present

## 2023-07-16 NOTE — Progress Notes (Unsigned)
Subjective: Chief Complaint  Patient presents with   Nail Problem    Nail trim    82 year old male presents the office today with his wife for follow-up evaluation of nail fungus.  He has been using topical medication.  No swelling redness or drainage of the toenail sites.  No lesions.   Objective: AAO x3, NAD DP/PT pulses palpable bilaterally, CRT less than 3 seconds Nails continue to be hypertrophic, dystrophic with yellow, brown discoloration.  There is clear along the proximal nail.  The distal portion of the nails are hypertrophic, dystrophic with yellow, brown discoloration noted so he gets tenderness.  No edema, erythema or signs of infection.  No open lesions.   No pain with calf compression, swelling, warmth, erythema  Assessment: Symptomatic onychomycosis  Plan: -All treatment options discussed with the patient including all alternatives, risks, complications.  -I sharply debrided nails without any complications or bleeding x 10.   -Continue topical medication for now. -Patient encouraged to call the office with any questions, concerns, change in symptoms.   No follow-ups on file.  Vivi Barrack DPM

## 2023-09-04 ENCOUNTER — Ambulatory Visit (INDEPENDENT_AMBULATORY_CARE_PROVIDER_SITE_OTHER): Payer: Medicare HMO | Admitting: Orthopedic Surgery

## 2023-09-04 VITALS — BP 124/70 | HR 72 | Resp 14 | Ht 65.5 in | Wt 139.0 lb

## 2023-09-04 DIAGNOSIS — Z23 Encounter for immunization: Secondary | ICD-10-CM

## 2023-10-18 ENCOUNTER — Ambulatory Visit: Payer: Medicare HMO | Admitting: Podiatry

## 2023-10-25 ENCOUNTER — Other Ambulatory Visit: Payer: Self-pay | Admitting: Orthopedic Surgery

## 2023-10-25 DIAGNOSIS — N529 Male erectile dysfunction, unspecified: Secondary | ICD-10-CM

## 2023-10-26 NOTE — Telephone Encounter (Signed)
Patient medication pend and sent to PCP Octavia Heir, NP for refill.

## 2023-10-29 ENCOUNTER — Other Ambulatory Visit: Payer: Self-pay | Admitting: Orthopedic Surgery

## 2023-12-05 LAB — HM DIABETES EYE EXAM

## 2023-12-14 ENCOUNTER — Other Ambulatory Visit: Payer: Self-pay

## 2023-12-14 DIAGNOSIS — E1142 Type 2 diabetes mellitus with diabetic polyneuropathy: Secondary | ICD-10-CM

## 2023-12-14 DIAGNOSIS — I10 Essential (primary) hypertension: Secondary | ICD-10-CM

## 2023-12-14 DIAGNOSIS — E1169 Type 2 diabetes mellitus with other specified complication: Secondary | ICD-10-CM

## 2024-01-14 ENCOUNTER — Other Ambulatory Visit: Payer: Medicare HMO

## 2024-01-15 ENCOUNTER — Encounter: Payer: Self-pay | Admitting: Orthopedic Surgery

## 2024-01-15 LAB — MICROALBUMIN / CREATININE URINE RATIO
Creatinine, Urine: 168 mg/dL (ref 20–320)
Microalb Creat Ratio: 8 mg/g{creat} (ref <30)
Microalb, Ur: 1.3 mg/dL

## 2024-01-15 LAB — CBC WITH DIFFERENTIAL/PLATELET
Absolute Lymphocytes: 2309 {cells}/uL (ref 850–3900)
Absolute Monocytes: 795 {cells}/uL (ref 200–950)
Basophils Absolute: 68 {cells}/uL (ref 0–200)
Basophils Relative: 0.7 %
Eosinophils Absolute: 310 {cells}/uL (ref 15–500)
Eosinophils Relative: 3.2 %
HCT: 45.1 % (ref 38.5–50.0)
Hemoglobin: 15.1 g/dL (ref 13.2–17.1)
MCH: 30.6 pg (ref 27.0–33.0)
MCHC: 33.5 g/dL (ref 32.0–36.0)
MCV: 91.3 fL (ref 80.0–100.0)
MPV: 10.7 fL (ref 7.5–12.5)
Monocytes Relative: 8.2 %
Neutro Abs: 6218 {cells}/uL (ref 1500–7800)
Neutrophils Relative %: 64.1 %
Platelets: 383 10*3/uL (ref 140–400)
RBC: 4.94 10*6/uL (ref 4.20–5.80)
RDW: 11.4 % (ref 11.0–15.0)
Total Lymphocyte: 23.8 %
WBC: 9.7 10*3/uL (ref 3.8–10.8)

## 2024-01-15 LAB — LIPID PANEL
Cholesterol: 144 mg/dL (ref <200)
HDL: 50 mg/dL (ref >=40)
LDL Cholesterol (Calc): 77 mg/dL
Non-HDL Cholesterol (Calc): 94 mg/dL (ref <130)
Total CHOL/HDL Ratio: 2.9 (calc) (ref <5.0)
Triglycerides: 91 mg/dL (ref <150)

## 2024-01-15 LAB — HEMOGLOBIN A1C
Hgb A1c MFr Bld: 6 %{Hb} — ABNORMAL HIGH (ref <5.7)
Mean Plasma Glucose: 126 mg/dL
eAG (mmol/L): 7 mmol/L

## 2024-01-15 LAB — COMPLETE METABOLIC PANEL WITH GFR
AG Ratio: 2.1 (calc) (ref 1.0–2.5)
ALT: 19 U/L (ref 9–46)
AST: 21 U/L (ref 10–35)
Albumin: 4.6 g/dL (ref 3.6–5.1)
Alkaline phosphatase (APISO): 74 U/L (ref 35–144)
BUN: 12 mg/dL (ref 7–25)
CO2: 30 mmol/L (ref 20–32)
Calcium: 9.7 mg/dL (ref 8.6–10.3)
Chloride: 101 mmol/L (ref 98–110)
Creat: 1.08 mg/dL (ref 0.70–1.22)
Globulin: 2.2 g/dL (ref 1.9–3.7)
Glucose, Bld: 92 mg/dL (ref 65–99)
Potassium: 4.3 mmol/L (ref 3.5–5.3)
Sodium: 139 mmol/L (ref 135–146)
Total Bilirubin: 0.8 mg/dL (ref 0.2–1.2)
Total Protein: 6.8 g/dL (ref 6.1–8.1)
eGFR: 69 mL/min/{1.73_m2} (ref >=60)

## 2024-01-17 ENCOUNTER — Ambulatory Visit (INDEPENDENT_AMBULATORY_CARE_PROVIDER_SITE_OTHER): Payer: Medicare HMO | Admitting: Orthopedic Surgery

## 2024-01-17 ENCOUNTER — Encounter: Payer: Self-pay | Admitting: Orthopedic Surgery

## 2024-01-17 VITALS — BP 128/64 | HR 75 | Temp 97.8°F | Resp 18 | Ht 65.5 in | Wt 142.0 lb

## 2024-01-17 DIAGNOSIS — I10 Essential (primary) hypertension: Secondary | ICD-10-CM | POA: Diagnosis not present

## 2024-01-17 DIAGNOSIS — E785 Hyperlipidemia, unspecified: Secondary | ICD-10-CM | POA: Diagnosis not present

## 2024-01-17 DIAGNOSIS — R7303 Prediabetes: Secondary | ICD-10-CM | POA: Diagnosis not present

## 2024-01-17 DIAGNOSIS — E7800 Pure hypercholesterolemia, unspecified: Secondary | ICD-10-CM

## 2024-01-17 DIAGNOSIS — R809 Proteinuria, unspecified: Secondary | ICD-10-CM

## 2024-01-17 DIAGNOSIS — E1169 Type 2 diabetes mellitus with other specified complication: Secondary | ICD-10-CM

## 2024-01-17 DIAGNOSIS — E1129 Type 2 diabetes mellitus with other diabetic kidney complication: Secondary | ICD-10-CM

## 2024-01-17 MED ORDER — LISINOPRIL-HYDROCHLOROTHIAZIDE 20-12.5 MG PO TABS: 1.0000 | ORAL_TABLET | Freq: Every day | ORAL | 2 refills | Status: DC

## 2024-01-17 NOTE — Patient Instructions (Addendum)
Continue current medication and diet regimen  Fasting labs next visit

## 2024-01-17 NOTE — Progress Notes (Addendum)
 Careteam: Patient Care Team: Octavia Heir, NP as PCP - General (Adult Health Nurse Practitioner) [name], MD as Consulting Physician (Ophthalmology) [name], MD as Consulting Physician (Gastroenterology) [facility]  Seen by: [name], AGNP-C  PLACE OF SERVICE:  [facility]  Advanced Directive information Does Patient Have a Medical Advance Directive?: Yes, Type of Advance Directive: Healthcare Power of [place];Living will, Does patient want to make changes to medical advance directive?: No - Patient declined  Allergies  Allergen Reactions   Penicillins Swelling   Shellfish Allergy Swelling    Chief Complaint  Patient presents with   Medical Management of Chronic Issues    6 month follow up.    Immunizations    Discuss the need for [employer].      HPI: Patient is a 83 y.o. male seen today for medical management of chronic conditions.   Discussed the use of AI scribe software for clinical note transcription with the patient, who gave verbal consent to proceed.  History of Present Illness   No health concerns today.   Requesting refill lisinopril-HCTZ.   Recent labs reviewed with patient.   The patient underwent his first retina surgery in December due to a gap in the retina. First noticed by Dr. Dione Booze. He was referred to Dr. Luciana Axe, retina specalist. A non-surgical attempt with gas bubbles was initially tried but was unsuccessful, leading to a second surgery where silicone was injected to close the hole and flatten the surface. Currently, he is unable to drive for six weeks, with four weeks remaining, and must avoid sleeping on his back. No falls or injuries have occurred during this period. Follow up surgical procedure to remove silicone scheduled.   He is managing prediabetes with an A1c of 6, slightly increased from 5.7 a year ago. He takes metformin daily as prescribed.   His blood pressure is managed with lisinopril and  hydrochlorothiazide, which also provides kidney protection.       Review of Systems:  Review of Systems  Constitutional: Negative.   HENT: Negative.    Eyes:  Negative for pain.  Respiratory: Negative.    Cardiovascular: Negative.   Gastrointestinal: Negative.   Genitourinary: Negative.   Musculoskeletal: Negative.   Skin: Negative.   Neurological: Negative.   Psychiatric/Behavioral: Negative.      Past Medical History:  Diagnosis Date   Abdominal pain, other specified site    Backache, unspecified    Carpal tunnel syndrome    Diabetes mellitus without complication (HCC)    Essential hypertension, benign    H/O hand surgery 02/25/2020   R hand    Hyperlipidemia    Impacted cerumen    Impaired fasting glucose    Other abnormal blood chemistry    Pain in joint, lower leg    Special screening for malignant neoplasm of prostate    Unspecified essential hypertension    Unspecified hypothyroidism    Past Surgical History:  Procedure Laterality Date   (L) KNEE SURGERY"TORN MENISCUS" Left 09/30/2008   ANTERIOR CERVICAL DECOMP/DISCECTOMY FUSION N/A 06/01/2023   Procedure: Anterior Cervical Decompression Fusion - Cervical five-Cervical six - Cervical six-Cervical seven;  Surgeon: Arman Bogus, MD;  Location: [facility] OR;  Service: Neurosurgery;  Laterality: N/A;   BACK SURGERY  03/03/2010   fusion   DG HAND RIGHT COMPLETE ([facility] HX) Right 12/25/2022   EYE SURGERY Left 11/24/2014   cataracts   HAND SURGERY Right 02/25/2020   hand surgery Right 12/25/2022  removed 2 growths, benign   hemorhoid     banding of rectal hemorrhoid   SHOULDER SURGERY Right 10/06/2019   TONSILLECTOMY  1953   Social History:   reports that he quit smoking about 58 years ago. His smoking use included cigarettes. He started smoking about 71 years ago. He has never used smokeless tobacco. He reports that he does not drink alcohol and does not use drugs.  Family History  Problem Relation Age of  Onset   Alzheimer's disease Sister    Cancer Brother 52       lung   Diabetes Sister    Diabetes Sister    Lung cancer Sister 56       mets to back   Kidney disease Sister    Allergies Sister    Obesity Sister    Cancer Mother     Medications: Patient's Medications  New Prescriptions   No medications on file  Previous Medications   ASCORBIC ACID (VITAMIN C) 1000 MG TABLET    Take 1,000 mg by mouth daily.   ASPIRIN 81 MG TABLET    Take 81 mg by mouth daily.   ATORVASTATIN (LIPITOR) 40 MG TABLET    TAKE 1 TABLET DAILY TO LOWER CHOLESTEROL   B COMPLEX VITAMINS CAPSULE    Take 1 capsule by mouth daily.   LATANOPROST (XALATAN) 0.005 % OPHTHALMIC SOLUTION    Place 1 drop into both eyes at bedtime.   LISINOPRIL-HYDROCHLOROTHIAZIDE (ZESTORETIC) 20-12.5 MG TABLET    TAKE 1 TABLET DAILY   METFORMIN (GLUCOPHAGE) 500 MG TABLET    TAKE 1 TABLET IN THE MORNING AND ONE-HALF (1/2) TABLET IN THE EVENING   MULTIPLE VITAMINS-MINERALS (MULTIVITAMIN & MINERAL PO)    Take 1 tablet by mouth daily.   NAPROXEN SODIUM (ALEVE) 220 MG TABLET    Take 220 mg by mouth daily as needed (pain).   SILDENAFIL (VIAGRA) 50 MG TABLET    TAKE 1 TABLET AS NEEDED FOR ERECTILE DYSFUNCTION  Modified Medications   No medications on file  Discontinued Medications   No medications on file    Physical Exam:  Vitals:   01/17/24 1049  BP: 128/64  Pulse: 75  Resp: 18  Temp: 97.8 F (36.6 C)  SpO2: 98%  Weight: 142 lb (64.4 kg)  Height: 5' 5.5" (1.664 m)   Body mass index is 23.27 kg/m. Wt Readings from Last 3 Encounters:  01/17/24 142 lb (64.4 kg)  09/04/23 139 lb (63 kg)  07/12/23 139 lb 3.2 oz (63.1 kg)    Physical Exam Vitals reviewed.  Constitutional:      General: He is not in acute distress. HENT:     Head: Normocephalic.  Eyes:     General:        Right eye: No discharge.        Left eye: No discharge.     Pupils: Pupils are equal, round, and reactive to light.     Comments: glasses   Cardiovascular:     Rate and Rhythm: Normal rate and regular rhythm.     Pulses: Normal pulses.     Heart sounds: Normal heart sounds.  Pulmonary:     Effort: Pulmonary effort is normal.     Breath sounds: Normal breath sounds.  Musculoskeletal:        General: Normal range of motion.     Cervical back: Neck supple.  Skin:    General: Skin is warm.     Capillary Refill: Capillary refill takes  less than 2 seconds.  Neurological:     General: No focal deficit present.     Mental Status: He is alert and oriented to person, place, and time.  Psychiatric:        Mood and Affect: Mood normal.     Labs reviewed: Basic Metabolic Panel: Recent Labs    05/29/23 1333 07/09/23 0924 01/14/24 0904  NA 139 139 139  K 4.2 3.9 4.3  CL 100 103 101  CO2 29 27 30   GLUCOSE 89 91 92  BUN 11 17 12   CREATININE 1.12 0.85 1.08  CALCIUM 9.7 9.6 9.7   Liver Function Tests: Recent Labs    01/30/23 1142 07/09/23 0924 01/14/24 0904  AST 26 17 21   ALT 24 17 19   ALKPHOS 82  --   --   BILITOT 0.6 0.6 0.8  PROT 7.1 6.7 6.8  ALBUMIN 4.8  --   --    No results for input(s): "LIPASE", "AMYLASE" in the last 8760 hours. No results for input(s): "AMMONIA" in the last 8760 hours. CBC: Recent Labs    01/30/23 1142 05/29/23 1333 07/09/23 0924 01/14/24 0904  WBC 8.7 10.3 8.1 9.7  NEUTROABS 4.8  --  5,119 6,218  HGB 14.4 15.4 13.6 15.1  HCT 42.5 46.7 40.8 45.1  MCV 88 91.4 92.3 91.3  PLT 314 404* 308 383   Lipid Panel: Recent Labs    07/09/23 0924 01/14/24 0904  CHOL 135 144  HDL 48 50  LDLCALC 68 77  TRIG 100 91  CHOLHDL 2.8 2.9   TSH: No results for input(s): "TSH" in the last 8760 hours. A1C: Lab Results  Component Value Date   HGBA1C 6.0 (H) 01/14/2024     Assessment/Plan 1. Microalbuminuria due to type 2 diabetes mellitus (HCC) - microalbumin ratio 8> at goal - cont lisinopril-hydrochlorothiazide for kidney protection  2. Essential hypertension, benign (Primary) -  controlled - cont lisinopril-hydrochlorothiazide  - discussed avoiding NSAIDS  3. Prediabetes - A1c 6.0 - followed by retina specialist> recent surgery> unknown if related to diabetes - cont asa, metformin, ACE/thiazide and statin  4. Pure hypercholesterolemia - total 144, LDL 77, goal < 70 - continue diet low in fat, processed and fried foods - cont atorvastatin  Total time: 30 minutes. Greater than 50% of total time spent doing patient education regarding health maintenance, prediabetes, HLD, HTN and recent retina surgery including symptom/medication management.    Next appt: 03/13/2024  [name], Juel Burrow  [facility] [phone]

## 2024-01-28 ENCOUNTER — Encounter: Payer: Self-pay | Admitting: Orthopedic Surgery

## 2024-03-05 ENCOUNTER — Encounter: Payer: Self-pay | Admitting: Orthopedic Surgery

## 2024-03-07 ENCOUNTER — Encounter: Payer: Medicare HMO | Admitting: Family

## 2024-03-13 ENCOUNTER — Ambulatory Visit: Payer: Medicare HMO | Admitting: Orthopedic Surgery

## 2024-03-13 ENCOUNTER — Encounter: Payer: Self-pay | Admitting: Orthopedic Surgery

## 2024-03-13 VITALS — Wt 136.8 lb

## 2024-03-13 DIAGNOSIS — Z0000 Encounter for general adult medical examination without abnormal findings: Secondary | ICD-10-CM | POA: Diagnosis not present

## 2024-03-13 NOTE — Progress Notes (Signed)
   This service is provided via telemedicine  One vital sign was collected/recorded during the telemedicine visit. Patient verbally told me his weight.   Location of patient (ex: home, work):  Home  Patient consents to a telephone visit:  Yes  Location of the provider (ex: office, home):  [employer].   Name of any referring provider:  Octavia Heir, NP   Names of all persons participating in the telemedicine service and their role in the encounter:  Patient, Mrs/Lown/Wife (In the background) Meda Klinefelter, RMA, [name], NP  Time spent on call: 8 minutes spent on the phone with Engineer, site.

## 2024-03-13 NOTE — Progress Notes (Signed)
 Subjective:   Tyler Hull is a 83 y.o. male who presents for Medicare Annual/Subsequent preventive examination.  Visit Complete: Virtual I connected with  Pablo Lawrence on 03/13/24 by a video and audio enabled telemedicine application and verified that I am speaking with the correct person using two identifiers.  Patient Location: Home  Provider Location: Home Office  I discussed the limitations of evaluation and management by telemedicine. The patient expressed understanding and agreed to proceed.  Vital Signs: Because this visit was a virtual/telehealth visit, some criteria may be missing or patient reported. Any vitals not documented were not able to be obtained and vitals that have been documented are patient reported.  Patient Medicare AWV questionnaire was completed by the patient on 03/13/2024; I have confirmed that all information answered by patient is correct and no changes since this date.        Objective:    Today's Vitals   03/13/24 1318  Weight: 136 lb 12.8 oz (62.1 kg)   Body mass index is 22.42 kg/m.     03/13/2024    1:15 PM 01/17/2024   10:47 AM 07/12/2023   10:25 AM 06/01/2023   10:46 AM 05/29/2023    1:27 PM 03/06/2023   12:56 PM 06/29/2022    8:45 AM  Advanced Directives  Does Patient Have a Medical Advance Directive? Yes Yes Yes  Yes No No  Type of Estate agent of [place];Living will Healthcare Power of [place];Living will Out of facility DNR (pink MOST or yellow form);Healthcare Power of [employer] of [place];Living will Living will;Healthcare Power of Attorney    Does patient want to make changes to medical advance directive? No - Patient declined No - Patient declined No - Patient declined      Copy of Healthcare Power of Attorney in Chart? No - copy requested No - copy requested No - copy requested No - copy requested No - copy requested    Would patient like information on creating a medical advance  directive?      No - Patient declined No - Patient declined    Current Medications (verified) Outpatient Encounter Medications as of 03/13/2024  Medication Sig   Ascorbic Acid (VITAMIN C) 1000 MG tablet Take 1,000 mg by mouth daily.   aspirin 81 MG tablet Take 81 mg by mouth daily.   atorvastatin (LIPITOR) 40 MG tablet TAKE 1 TABLET DAILY TO LOWER CHOLESTEROL   b complex vitamins capsule Take 1 capsule by mouth daily.   latanoprost (XALATAN) 0.005 % ophthalmic solution Place 1 drop into both eyes at bedtime.   lisinopril-hydrochlorothiazide (ZESTORETIC) 20-12.5 MG tablet Take 1 tablet by mouth daily.   metFORMIN (GLUCOPHAGE) 500 MG tablet TAKE 1 TABLET IN THE MORNING AND ONE-HALF (1/2) TABLET IN THE EVENING   Multiple Vitamins-Minerals (MULTIVITAMIN & MINERAL PO) Take 1 tablet by mouth daily.   naproxen sodium (ALEVE) 220 MG tablet Take 220 mg by mouth daily as needed (pain).   sildenafil (VIAGRA) 50 MG tablet TAKE 1 TABLET AS NEEDED FOR ERECTILE DYSFUNCTION   No facility-administered encounter medications on file as of 03/13/2024.    Allergies (verified) Penicillins and Shellfish allergy   History: Past Medical History:  Diagnosis Date   Abdominal pain, other specified site    Backache, unspecified    Carpal tunnel syndrome    Diabetes mellitus without complication (HCC)    Essential hypertension, benign    H/O hand surgery 02/25/2020   R hand  Hyperlipidemia    Impacted cerumen    Impaired fasting glucose    Other abnormal blood chemistry    Pain in joint, lower leg    Special screening for malignant neoplasm of prostate    Unspecified essential hypertension    Unspecified hypothyroidism    Past Surgical History:  Procedure Laterality Date   (L) KNEE SURGERY"TORN MENISCUS" Left 09/30/2008   ANTERIOR CERVICAL DECOMP/DISCECTOMY FUSION N/A 06/01/2023   Procedure: Anterior Cervical Decompression Fusion - Cervical five-Cervical six - Cervical six-Cervical seven;  Surgeon:  Arman Bogus, MD;  Location: [facility] OR;  Service: Neurosurgery;  Laterality: N/A;   BACK SURGERY  03/03/2010   fusion   DG HAND RIGHT COMPLETE ([facility] HX) Right 12/25/2022   EYE SURGERY Left 11/24/2014   cataracts   HAND SURGERY Right 02/25/2020   hand surgery Right 12/25/2022   removed 2 growths, benign   hemorhoid     banding of rectal hemorrhoid   SHOULDER SURGERY Right 10/06/2019   TONSILLECTOMY  1953   Family History  Problem Relation Age of Onset   Alzheimer's disease Sister    Cancer Brother 52       lung   Diabetes Sister    Diabetes Sister    Lung cancer Sister 39       mets to back   Kidney disease Sister    Allergies Sister    Obesity Sister    Cancer Mother    Social History   Socioeconomic History   Marital status: Married    Spouse name: Dorann Lodge   Number of children: 2   Years of education: Not on file   Highest education level: Some college, no degree  Occupational History   Not on file  Tobacco Use   Smoking status: Former    Current packs/day: 0.00    Types: Cigarettes    Start date: 01/02/1953    Quit date: 01/02/1966    Years since quitting: 58.2   Smokeless tobacco: Never   Tobacco comments:    Quit at age 60   Vaping Use   Vaping status: Never Used  Substance and Sexual Activity   Alcohol use: No   Drug use: No   Sexual activity: Yes  Other Topics Concern   Not on file  Social History Narrative   Not on file   Social Drivers of Health   Financial Resource Strain: Low Risk  (03/13/2024)   Overall Financial Resource Strain (CARDIA)    Difficulty of Paying Living Expenses: Not hard at all  Food Insecurity: No Food Insecurity (03/13/2024)   Hunger Vital Sign    Worried About Running Out of Food in the Last Year: Never true    Ran Out of Food in the Last Year: Never true  Transportation Needs: No Transportation Needs (03/13/2024)   PRAPARE - Administrator, Civil Service (Medical): No    Lack of Transportation (Non-Medical):  No  Physical Activity: Insufficiently Active (03/13/2024)   Exercise Vital Sign    Days of Exercise per Week: 2 days    Minutes of Exercise per Session: 50 min  Stress: No Stress Concern Present (03/13/2024)   [employer] of Occupational Health - Occupational Stress Questionnaire    Feeling of Stress : Not at all  Social Connections: Socially Integrated (03/13/2024)   Social Connection and Isolation Panel [NHANES]    Frequency of Communication with Friends and Family: Three times a week    Frequency of Social Gatherings with Friends and  Family: Three times a week    Attends Religious Services: More than 4 times per year    Active Member of Clubs or Organizations: Yes    Attends Engineer, structural: More than 4 times per year    Marital Status: Married    Tobacco Counseling Counseling given: Not Answered Tobacco comments: Quit at age 77    Clinical Intake:  Pre-visit preparation completed: Yes  Pain : No/denies pain     BMI - recorded: 23.27 Nutritional Status: BMI of 19-24  Normal Nutritional Risks: None Diabetes: No  What is the last grade level you completed in school?: High School  Interpreter Needed?: No      Activities of Daily Living    03/09/2024    3:27 PM 05/29/2023    1:31 PM  In your present state of health, do you have any difficulty performing the following activities:  Hearing? 0    Vision? 0    Difficulty concentrating or making decisions? 0    Walking or climbing stairs? 0    Dressing or bathing? 0    Doing errands, shopping? 0  0  Preparing Food and eating ? N    Using the Toilet? N    In the past six months, have you accidently leaked urine? N    Do you have problems with loss of bowel control? N    Managing your Medications? N    Managing your Finances? N    Housekeeping or managing your Housekeeping? N       Proxy-reported    Patient Care Team: Octavia Heir, NP as PCP - General (Adult Health Nurse Practitioner) [name], MD as Consulting Physician (Ophthalmology) [name], MD as Consulting Physician (Gastroenterology) [facility]  Indicate any recent Medical Services you may have received from other than [facility] providers in the past year (date may be approximate).     Assessment:   This is a routine wellness examination for Tyler Hull.  Hearing/Vision screen Hearing Screening - Comments:: No hearing concerns.  Vision Screening - Comments:: Some vision concerns. Patient last eye exam 03/05/2024. Patient wears prescription glasses.    Goals Addressed             This Visit's Progress    Increase water intake   Not on track    Starting 02/08/17, I will attempt to increase my water intake from 2 bottles per day to 5 bottles per day.        Depression Screen    03/13/2024    1:39 PM 03/13/2024    1:13 PM 01/17/2024   12:28 PM 03/06/2023   12:53 PM 01/04/2023   10:32 AM 03/02/2022   11:31 AM 02/09/2022   10:07 AM  PHQ 2/9 Scores  PHQ - 2 Score 0 0 0 0 0 0 0    Fall Risk    03/13/2024    1:13 PM 03/09/2024    3:27 PM 01/17/2024   10:47 AM 07/12/2023   10:25 AM 03/06/2023   12:53 PM  Fall Risk   Falls in the past year? 1 1  0 0 0  Number falls in past yr: 0 0  0 0 0  Injury with Fall? 0 0  0 0 0  Risk for fall due to : History of fall(s)  No Fall Risks No Fall Risks No Fall Risks  Follow up Falls evaluation completed  Falls evaluation completed;Education provided;Falls prevention discussed Falls evaluation completed;Education provided;Falls prevention discussed Falls  evaluation completed     Proxy-reported    MEDICARE RISK AT HOME: Medicare Risk at Home Any stairs in or around the home?: No If so, are there any without handrails?: No Home free of loose throw rugs in walkways, pet beds, electrical cords, etc?: Yes Adequate lighting in your home to reduce risk of falls?: Yes Life alert?: No Use of a cane, walker or w/c?: No Grab bars in the bathroom?: Yes Shower  chair or bench in shower?: No Elevated toilet seat or a handicapped toilet?: Yes  TIMED UP AND GO:  Was the test performed?  No    Cognitive Function:    02/18/2019    2:58 PM 02/12/2018    2:52 PM 02/08/2017    8:40 AM 01/03/2016    2:14 PM  MMSE - Mini Mental State Exam  Orientation to time 5 4 5 5   Orientation to Place 5 5 5 5   Registration 3 3 3 3   Attention/ Calculation 5 5 5 5   Recall 3 2 3 3   Language- name 2 objects 2 2 2 2   Language- repeat 1 1 1 1   Language- follow 3 step command 3 3 3 3   Language- read & follow direction 1 1 1 1   Write a sentence 1 1 1 1   Copy design 1 1 1 1   Total score 30 28 30 30         03/13/2024    1:15 PM 03/06/2023   12:53 PM 03/02/2022   11:32 AM 02/24/2021   11:42 AM 02/19/2020   11:25 AM  6CIT Screen  What Year? 0 points 0 points 0 points 0 points 0 points  What month? 0 points 0 points 0 points 0 points 0 points  What time? 0 points 0 points 0 points 0 points 0 points  Count back from 20 0 points 0 points 0 points 2 points 0 points  Months in reverse 0 points 2 points 0 points 2 points 0 points  Repeat phrase 2 points 4 points 0 points 0 points 2 points  Total Score 2 points 6 points 0 points 4 points 2 points    Immunizations Immunization History  Administered Date(s) Administered   Fluad Quad(high Dose 65+) 09/08/2019, 09/07/2020, 10/18/2021, 10/17/2022   Fluad Trivalent(High Dose 65+) 09/04/2023   Influenza, High Dose Seasonal PF 08/30/2017, 09/09/2018   Influenza,inj,Quad PF,6+ Mos 08/31/2014, 09/30/2015, 08/03/2016   Influenza,inj,quad, With Preservative 09/17/2018   Influenza-Unspecified 09/17/2012, 09/17/2013   PFIZER Comirnaty(Gray Top)Covid-19 Tri-Sucrose Vaccine 10/24/2022   PFIZER(Purple Top)SARS-COV-2 Vaccination 01/09/2020, 01/30/2020, 03/31/2020, 08/24/2020, 11/21/2021   Pfizer Covid-19 Vaccine Bivalent Booster 71yrs & up 03/31/2021   Pneumococcal Conjugate-13 11/26/2014, 11/27/2014   Pneumococcal Polysaccharide-23  12/03/2007   Td 12/03/2007   Tdap 12/26/2017   Zoster Recombinant(Shingrix) 06/01/2017, 11/14/2017   Zoster, Live 03/15/2013    TDAP status: Up to date  Flu Vaccine status: Up to date  Pneumococcal vaccine status: Up to date  Covid-19 vaccine status: Completed vaccines  Qualifies for Shingles Vaccine? Yes   Zostavax completed Yes   Shingrix Completed?: Yes  Screening Tests Health Maintenance  Topic Date Due   COVID-19 Vaccine (8 - 2024-25 season) 08/19/2023   FOOT EXAM  07/11/2024   HEMOGLOBIN A1C  07/13/2024   OPHTHALMOLOGY EXAM  12/04/2024   Diabetic kidney evaluation - eGFR measurement  01/13/2025   Diabetic kidney evaluation - Urine ACR  01/13/2025   Medicare Annual Wellness (AWV)  03/13/2025   DTaP/Tdap/Td (3 - Td or Tdap) 12/27/2027  Pneumonia Vaccine 63+ Years old  Completed   INFLUENZA VACCINE  Completed   Zoster Vaccines- Shingrix  Completed   HPV VACCINES  Aged Out   Hepatitis C Screening  Discontinued    Health Maintenance  Health Maintenance Due  Topic Date Due   COVID-19 Vaccine (8 - 2024-25 season) 08/19/2023    Colorectal cancer screening: No longer required.   Lung Cancer Screening: (Low Dose CT Chest recommended if Age 72-80 years, 20 pack-year currently smoking OR have quit w/in 15years.) does not qualify.   Lung Cancer Screening Referral: No  Additional Screening:  Hepatitis C Screening: does not qualify; Completed   Vision Screening: Recommended annual ophthalmology exams for early detection of glaucoma and other disorders of the eye. Is the patient up to date with their annual eye exam?  No  Who is the provider or what is the name of the office in which the patient attends annual eye exams? Dr. Dione Booze If pt is not established with a provider, would they like to be referred to a provider to establish care? No .   Dental Screening: Recommended annual dental exams for proper oral hygiene  Diabetic Foot Exam: Diabetic Foot Exam: Completed  01/17/2024  Community Resource Referral / Chronic Care Management: CRR required this visit?  No   CCM required this visit?  No     Plan:     I have personally reviewed and noted the following in the patient's chart:   Medical and social history Use of alcohol, tobacco or illicit drugs  Current medications and supplements including opioid prescriptions. Patient is not currently taking opioid prescriptions. Functional ability and status Nutritional status Physical activity Advanced directives List of other physicians Hospitalizations, surgeries, and ER visits in previous 12 months Vitals Screenings to include cognitive, depression, and falls Referrals and appointments  In addition, I have reviewed and discussed with patient certain preventive protocols, quality metrics, and best practice recommendations. A written personalized care plan for preventive services as well as general preventive health recommendations were provided to patient.     Octavia Heir, NP   03/13/2024   After Visit Summary: (MyChart) Due to this being a telephonic visit, the after visit summary with patients personalized plan was offered to patient via MyChart   Nurse Notes: CIT score 2, UTD on vaccinations. Followed by Dr. Dione Booze for eye exams.

## 2024-03-27 ENCOUNTER — Encounter: Payer: Self-pay | Admitting: Orthopedic Surgery

## 2024-03-27 ENCOUNTER — Ambulatory Visit: Admitting: Orthopedic Surgery

## 2024-03-27 VITALS — BP 120/58 | HR 71 | Temp 97.4°F | Resp 16 | Ht 65.5 in | Wt 142.2 lb

## 2024-03-27 DIAGNOSIS — R490 Dysphonia: Secondary | ICD-10-CM

## 2024-03-27 DIAGNOSIS — R6889 Other general symptoms and signs: Secondary | ICD-10-CM | POA: Diagnosis not present

## 2024-03-27 DIAGNOSIS — M25562 Pain in left knee: Secondary | ICD-10-CM

## 2024-03-27 MED ORDER — PREDNISONE 20 MG PO TABS: 20.0000 mg | ORAL_TABLET | Freq: Every day | ORAL | 0 refills | Status: AC

## 2024-03-27 NOTE — Patient Instructions (Addendum)
 Please schedule ABI test for lower legs at [facility] [phone]  Please take prednisone in the morning  Referral made to ENT for voice changes

## 2024-03-27 NOTE — Progress Notes (Signed)
 Careteam: Patient Care Team: Octavia Heir, NP as PCP - General (Adult Health Nurse Practitioner) [name], MD as Consulting Physician (Ophthalmology) [name], MD as Consulting Physician (Gastroenterology) [facility]  Seen by: [name], AGNP-C  PLACE OF SERVICE:  [facility]  Advanced Directive information    Allergies  Allergen Reactions   Penicillins Swelling   Shellfish Allergy Swelling    Chief Complaint  Patient presents with   Leg Pain    [employer] has abnormal blockage in patient leg. Patient complains of left leg pain.      HPI: Patient is a 83 y.o. male seen today for acute visit due to left leg pain.   Discussed the use of AI scribe software for clinical note transcription with the patient, who gave verbal consent to proceed.  History of Present Illness   Tyler Hull is an 83 year old male who presents with left leg pain and swelling after a fall.  He experiences pain and swelling in his left leg following a fall from bed, during which he hit the dresser. The sensation is described as 'the blood is warm' and 'bubbles like that.' This incident occurred over a week ago. The pain is not constant but is associated with pressure and warmth in the leg. He has been using a brace and initially applied ice, but has not done so recently. He previously received a cortisone shot, which was effective until the fall.  He underwent a wellness exam on January 31st, which included an ABI test. The results showed a mild abnormality on the left side (0.84) and borderline on the right (0.95). No pain in the right leg and no symptoms typically associated with peripheral artery disease, such as non-healing sores or claudication.  He reports a change in his voice since undergoing a second retina surgery on January 10th. He describes his voice as lacking volume and becoming hoarse after prolonged talking. He has tried using a humidifier and medications  like Xyzal and Mucinex without improvement. He stopped smoking 60 years ago     Review of Systems:  Review of Systems  Constitutional: Negative.   HENT:  Positive for congestion. Negative for sore throat.        Hoarse voice  Eyes: Negative.   Respiratory:  Negative for cough, shortness of breath and wheezing.   Cardiovascular:  Negative for chest pain, claudication and leg swelling.  Gastrointestinal: Negative.   Genitourinary: Negative.   Musculoskeletal:  Positive for falls and joint pain.  Skin: Negative.   Neurological: Negative.   Psychiatric/Behavioral: Negative.      Past Medical History:  Diagnosis Date   Abdominal pain, other specified site    Backache, unspecified    Carpal tunnel syndrome    Diabetes mellitus without complication (HCC)    Essential hypertension, benign    H/O hand surgery 02/25/2020   R hand    Hyperlipidemia    Impacted cerumen    Impaired fasting glucose    Other abnormal blood chemistry    Pain in joint, lower leg    Special screening for malignant neoplasm of prostate    Unspecified essential hypertension    Unspecified hypothyroidism    Past Surgical History:  Procedure Laterality Date   (L) KNEE SURGERY"TORN MENISCUS" Left 09/30/2008   ANTERIOR CERVICAL DECOMP/DISCECTOMY FUSION N/A 06/01/2023   Procedure: Anterior Cervical Decompression Fusion - Cervical five-Cervical six - Cervical six-Cervical seven;  Surgeon: Arman Bogus, MD;  Location: [facility]  OR;  Service: Neurosurgery;  Laterality: N/A;   BACK SURGERY  03/03/2010   fusion   DG HAND RIGHT COMPLETE ([facility] HX) Right 12/25/2022   EYE SURGERY Left 11/24/2014   cataracts   HAND SURGERY Right 02/25/2020   hand surgery Right 12/25/2022   removed 2 growths, benign   hemorhoid     banding of rectal hemorrhoid   SHOULDER SURGERY Right 10/06/2019   TONSILLECTOMY  1953   Social History:   reports that he quit smoking about 58 years ago. His smoking use included cigarettes. He  started smoking about 71 years ago. He has never used smokeless tobacco. He reports that he does not drink alcohol and does not use drugs.  Family History  Problem Relation Age of Onset   Alzheimer's disease Sister    Cancer Brother 47       lung   Diabetes Sister    Diabetes Sister    Lung cancer Sister 36       mets to back   Kidney disease Sister    Allergies Sister    Obesity Sister    Cancer Mother     Medications: Patient's Medications  New Prescriptions   No medications on file  Previous Medications   ASCORBIC ACID (VITAMIN C) 1000 MG TABLET    Take 1,000 mg by mouth daily.   ASPIRIN 81 MG TABLET    Take 81 mg by mouth daily.   ATORVASTATIN (LIPITOR) 40 MG TABLET    TAKE 1 TABLET DAILY TO LOWER CHOLESTEROL   B COMPLEX VITAMINS CAPSULE    Take 1 capsule by mouth daily.   LATANOPROST (XALATAN) 0.005 % OPHTHALMIC SOLUTION    Place 1 drop into both eyes at bedtime.   LISINOPRIL-HYDROCHLOROTHIAZIDE (ZESTORETIC) 20-12.5 MG TABLET    Take 1 tablet by mouth daily.   METFORMIN (GLUCOPHAGE) 500 MG TABLET    TAKE 1 TABLET IN THE MORNING AND ONE-HALF (1/2) TABLET IN THE EVENING   MULTIPLE VITAMINS-MINERALS (MULTIVITAMIN & MINERAL PO)    Take 1 tablet by mouth daily.   NAPROXEN SODIUM (ALEVE) 220 MG TABLET    Take 220 mg by mouth daily as needed (pain).   OMEGA-3 FATTY ACIDS (FISH OIL) 1000 MG CAPS    Take 1 capsule by mouth daily.   SILDENAFIL (VIAGRA) 50 MG TABLET    TAKE 1 TABLET AS NEEDED FOR ERECTILE DYSFUNCTION  Modified Medications   No medications on file  Discontinued Medications   No medications on file    Physical Exam:  Vitals:   03/27/24 1409  BP: (!) 120/58  Pulse: 71  Resp: 16  Temp: (!) 97.4 F (36.3 C)  SpO2: 96%  Weight: 142 lb 3.2 oz (64.5 kg)  Height: 5' 5.5" (1.664 m)   Body mass index is 23.3 kg/m. Wt Readings from Last 3 Encounters:  03/27/24 142 lb 3.2 oz (64.5 kg)  03/13/24 136 lb 12.8 oz (62.1 kg)  01/17/24 142 lb (64.4 kg)    Physical  Exam Vitals reviewed.  Constitutional:      General: He is not in acute distress. HENT:     Head: Normocephalic.     Nose: Nose normal.     Mouth/Throat:     Mouth: Mucous membranes are moist.     Pharynx: No posterior oropharyngeal erythema.  Eyes:     General:        Right eye: No discharge.        Left eye: No discharge.  Neck:  Thyroid: No thyroid mass or thyromegaly.     Trachea: No tracheal tenderness or tracheal deviation.  Cardiovascular:     Rate and Rhythm: Normal rate and regular rhythm.     Pulses: Normal pulses.     Heart sounds: Normal heart sounds.  Pulmonary:     Effort: Pulmonary effort is normal.     Breath sounds: Normal breath sounds.  Abdominal:     General: Bowel sounds are normal.     Palpations: Abdomen is soft.  Musculoskeletal:     Cervical back: Neck supple.     Left knee: Swelling present. No deformity or crepitus. Normal range of motion. Tenderness present over the MCL.     Right lower leg: No edema.     Left lower leg: No edema.  Lymphadenopathy:     Cervical: No cervical adenopathy.  Skin:    General: Skin is warm.     Capillary Refill: Capillary refill takes less than 2 seconds.  Neurological:     General: No focal deficit present.     Mental Status: He is alert and oriented to person, place, and time.  Psychiatric:        Mood and Affect: Mood normal.     Labs reviewed: Basic Metabolic Panel: Recent Labs    05/29/23 1333 07/09/23 0924 01/14/24 0904  NA 139 139 139  K 4.2 3.9 4.3  CL 100 103 101  CO2 29 27 30   GLUCOSE 89 91 92  BUN 11 17 12   CREATININE 1.12 0.85 1.08  CALCIUM 9.7 9.6 9.7   Liver Function Tests: Recent Labs    07/09/23 0924 01/14/24 0904  AST 17 21  ALT 17 19  BILITOT 0.6 0.8  PROT 6.7 6.8   No results for input(s): "LIPASE", "AMYLASE" in the last 8760 hours. No results for input(s): "AMMONIA" in the last 8760 hours. CBC: Recent Labs    05/29/23 1333 07/09/23 0924 01/14/24 0904  WBC 10.3  8.1 9.7  NEUTROABS  --  5,119 6,218  HGB 15.4 13.6 15.1  HCT 46.7 40.8 45.1  MCV 91.4 92.3 91.3  PLT 404* 308 383   Lipid Panel: Recent Labs    07/09/23 0924 01/14/24 0904  CHOL 135 144  HDL 48 50  LDLCALC 68 77  TRIG 100 91  CHOLHDL 2.8 2.9   TSH: No results for input(s): "TSH" in the last 8760 hours. A1C: Lab Results  Component Value Date   HGBA1C 6.0 (H) 01/14/2024     Assessment/Plan 1. Abnormal ankle brachial index (ABI) (Primary) - 01/18/2024 [facility] home visit>ABI: Left 0.84, right 0.95 - asymptomatic - no sores or discoloration on exam - US ARTERIAL ABI (SCREENING LOWER EXTREMITY); Future  2. Acute pain of left knee - fall and hit left knee > 1 week - left knee swelling along MCL - followed by ortho and last injection did not help - will try 5 day taper of low dose prednisone due to diabetes - predniSONE (DELTASONE) 20 MG tablet; Take 1 tablet (20 mg total) by mouth daily with breakfast for 5 days.  Dispense: 5 tablet; Refill: 0  3. Voice hoarseness - began after retina surgery 03/05/2024 - procedure note does not mention intubation - he did have spinal fusion 06/14 with general anesthesia - stopped smoking 60 years ago - using humidifier daily - also on antihistamine prn - patient requests ENT evaluation - Ambulatory referral to ENT  Total time: 31 minutes. Greater than 50% of total time spent doing  patient education regarding left leg pain, abnormal ABI and hoarse voice including symptom/medication management.     Next appt: 07/17/2024  [name], Juel Burrow  [facility] [phone]

## 2024-04-04 ENCOUNTER — Ambulatory Visit
Admission: RE | Admit: 2024-04-04 | Discharge: 2024-04-04 | Disposition: A | Discharge status: Home / Self Care | Source: Ambulatory Visit | Attending: Orthopedic Surgery | Admitting: Orthopedic Surgery

## 2024-04-04 DIAGNOSIS — R6889 Other general symptoms and signs: Secondary | ICD-10-CM

## 2024-04-07 ENCOUNTER — Other Ambulatory Visit

## 2024-04-15 ENCOUNTER — Ambulatory Visit (INDEPENDENT_AMBULATORY_CARE_PROVIDER_SITE_OTHER): Admitting: Otolaryngology

## 2024-04-15 ENCOUNTER — Encounter (INDEPENDENT_AMBULATORY_CARE_PROVIDER_SITE_OTHER): Payer: Self-pay | Admitting: Otolaryngology

## 2024-04-15 VITALS — BP 142/76 | HR 75 | Ht 65.5 in | Wt 142.0 lb

## 2024-04-15 DIAGNOSIS — K219 Gastro-esophageal reflux disease without esophagitis: Secondary | ICD-10-CM | POA: Diagnosis not present

## 2024-04-15 DIAGNOSIS — J383 Other diseases of vocal cords: Secondary | ICD-10-CM | POA: Diagnosis not present

## 2024-04-15 DIAGNOSIS — J343 Hypertrophy of nasal turbinates: Secondary | ICD-10-CM

## 2024-04-15 DIAGNOSIS — R490 Dysphonia: Secondary | ICD-10-CM | POA: Diagnosis not present

## 2024-04-15 DIAGNOSIS — R0982 Postnasal drip: Secondary | ICD-10-CM

## 2024-04-15 DIAGNOSIS — J3089 Other allergic rhinitis: Secondary | ICD-10-CM

## 2024-04-15 DIAGNOSIS — R0981 Nasal congestion: Secondary | ICD-10-CM

## 2024-04-15 MED ORDER — FLUTICASONE PROPIONATE 50 MCG/ACT NA SUSP: 2.0000 | Freq: Every day | NASAL | 6 refills | Status: AC

## 2024-04-15 NOTE — Patient Instructions (Signed)

## 2024-04-15 NOTE — Progress Notes (Signed)
 ENT CONSULT:  Reason for Consult: chronic hoarseness   HPI: Discussed the use of AI scribe software for clinical note transcription with the patient, who gave verbal consent to proceed.  History of Present Illness Tyler Hull is an 83 year old male who presents with voice changes/hoarseness and dysphonia following neck surgery and eye surgeries, both of which requiring intubation.   He has experienced changes in his voice following two surgeries: a neck surgery for spinal stenosis in June and subsequent eye surgery more recently. His voice starts to 'ripple' if he talks for too long and lacks the volume he previously had, particularly when trying to speak loudly. These changes were first noticed after his neck surgery and became more prevalent following his eye surgery.  The neck surgery for spinal stenosis was performed on the right side through the front of his neck. During the eye surgeries, he was intubated. The voice changes have persisted for nearly a year and were not present before his neck procedure.  No difficulty with swallowing, heartburn, or reflux symptoms. He has allergies to dust and pollen but does not currently take medication for these allergies. He takes Xyzal at night and Mucinex once a day.  His past medical history includes surgeries below the neck, such as on his knee, back, arm, and shoulder. He has a history of smoking, having started at age 64 and smoking for a short period of time. He quit decades ago.    Records Reviewed:  D/c summary 06/02/23 Patient mated to hospital where he underwent uncomplicated two-level anterior cervical decompression and fusion surgery for treatment of his compressive cervical myelopathy postoperative doing reasonably well.  Preoperative neck and upper extremity radicular and myelopathic symptoms improved.  Standing ambulating and voiding without difficulty.  Ready for discharge home.   PCP office 03/27/24 Patient mated to hospital  where he underwent uncomplicated two-level anterior cervical decompression and fusion surgery for treatment of his compressive cervical myelopathy postoperative doing reasonably well.  Preoperative neck and upper extremity radicular and myelopathic symptoms improved.  Standing ambulating and voiding without difficulty.  Ready for discharge home.  3. Voice hoarseness - began after retina surgery 03/05/2024 - procedure note does not mention intubation - he did have spinal fusion 06/14 with general anesthesia - stopped smoking 60 years ago - using humidifier daily - also on antihistamine prn - patient requests ENT evaluation - Ambulatory referral to ENT      Past Medical History:  Diagnosis Date   Abdominal pain, other specified site    Backache, unspecified    Carpal tunnel syndrome    Diabetes mellitus without complication (HCC)    Essential hypertension, benign    H/O hand surgery 02/25/2020   R hand    Hyperlipidemia    Impacted cerumen    Impaired fasting glucose    Other abnormal blood chemistry    Pain in joint, lower leg    Special screening for malignant neoplasm of prostate    Unspecified essential hypertension    Unspecified hypothyroidism     Past Surgical History:  Procedure Laterality Date   (L) KNEE SURGERY"TORN MENISCUS" Left 09/30/2008   ANTERIOR CERVICAL DECOMP/DISCECTOMY FUSION N/A 06/01/2023   Procedure: Anterior Cervical Decompression Fusion - Cervical five-Cervical six - Cervical six-Cervical seven;  Surgeon: Joaquin Mulberry, MD;  Location: [facility] OR;  Service: Neurosurgery;  Laterality: N/A;   BACK SURGERY  03/03/2010   fusion   DG HAND RIGHT COMPLETE ([facility] HX) Right 12/25/2022   EYE  SURGERY Left 11/24/2014   cataracts   HAND SURGERY Right 02/25/2020   hand surgery Right 12/25/2022   removed 2 growths, benign   hemorhoid     banding of rectal hemorrhoid   SHOULDER SURGERY Right 10/06/2019   TONSILLECTOMY  1953    Family History  Problem Relation  Age of Onset   Alzheimer's disease Sister    Cancer Brother 64       lung   Diabetes Sister    Diabetes Sister    Lung cancer Sister 35       mets to back   Kidney disease Sister    Allergies Sister    Obesity Sister    Cancer Mother     Social History:  reports that he quit smoking about 58 years ago. His smoking use included cigarettes. He started smoking about 71 years ago. He has never used smokeless tobacco. He reports that he does not drink alcohol and does not use drugs.  Allergies:  Allergies  Allergen Reactions   Penicillins Swelling   Shellfish Allergy Swelling    Medications: I have reviewed the patient's current medications.  The PMH, PSH, Medications, Allergies, and SH were reviewed and updated.  ROS: Constitutional: Negative for fever, weight loss and weight gain. Cardiovascular: Negative for chest pain and dyspnea on exertion. Respiratory: Is not experiencing shortness of breath at rest. Gastrointestinal: Negative for nausea and vomiting. Neurological: Negative for headaches. Psychiatric: The patient is not nervous/anxious  Blood pressure (!) 142/76, pulse 75, height 5' 5.5" (1.664 m), weight 142 lb (64.4 kg), SpO2 96%. Body mass index is 23.27 kg/m.  PHYSICAL EXAM:  Exam: General: Well-developed, well-nourished Communication and Voice: raspy Respiratory Respiratory effort: Equal inspiration and expiration without stridor Cardiovascular Peripheral Vascular: Warm extremities with equal color/perfusion Eyes: No nystagmus with equal extraocular motion bilaterally Neuro/Psych/Balance: Patient oriented to person, place, and time; Appropriate mood and affect; Gait is intact with no imbalance; Cranial nerves I-XII are intact Head and Face Inspection: Normocephalic and atraumatic without mass or lesion Palpation: Facial skeleton intact without bony stepoffs Salivary Glands: No mass or tenderness Facial Strength: Facial motility symmetric and full  bilaterally ENT Pinna: External ear intact and fully developed External canal: Canal is patent with intact skin Tympanic Membrane: Clear and mobile External Nose: No scar or anatomic deformity Internal Nose: Septum is deviated to the left. No polyp, or purulence. Mucosal edema and erythema present.  Bilateral inferior turbinate hypertrophy.  Lips, Teeth, and gums: Mucosa and teeth intact and viable TMJ: No pain to palpation with full mobility Oral cavity/oropharynx: No erythema or exudate, no lesions present Nasopharynx: No mass or lesion with intact mucosa Hypopharynx: Intact mucosa without pooling of secretions Larynx Glottic: Full true vocal cord mobility without lesion or mass Supraglottic: Normal appearing epiglottis and AE folds Interarytenoid Space: Moderate pachydermia&edema Subglottic Space: Patent without lesion or edema Neck Neck and Trachea: Midline trachea without mass or lesion Thyroid: No mass or nodularity Lymphatics: No lymphadenopathy  Procedure: Preoperative diagnosis: dysphonia and hoarseness   Postoperative diagnosis:   Same + VF atrophy and GERD LPR  Procedure: Flexible fiberoptic laryngoscopy  Surgeon: Artice Last, MD  Anesthesia: Topical lidocaine  and Afrin Complications: None Condition is stable throughout exam  Indications and consent:  The patient presents to the clinic with above symptoms. Indirect laryngoscopy view was incomplete. Thus it was recommended that they undergo a flexible fiberoptic laryngoscopy. All of the risks, benefits, and potential complications were reviewed with the patient preoperatively and verbal informed  consent was obtained.  Procedure: The patient was seated upright in the clinic. Topical lidocaine  and Afrin were applied to the nasal cavity. After adequate anesthesia had occurred, I then proceeded to pass the flexible telescope into the nasal cavity. The nasal cavity was patent without rhinorrhea or polyp. The  nasopharynx was also patent without mass or lesion. The base of tongue was visualized and was normal. There were no signs of pooling of secretions in the piriform sinuses. The true vocal folds were mobile bilaterally. There were no signs of glottic or supraglottic mucosal lesion or mass. There was moderate interarytenoid pachydermia and post cricoid edema. The telescope was then slowly withdrawn and the patient tolerated the procedure throughout.      Studies Reviewed: CT neck 10/03/22 1. 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 Bilateral foraminal stenosis at C5-6 and C6-7 that could affect either C6 or C7 nerve. Note that the stenosis is functionally more significant with the extension of the neck. At myelography, in the extended position, there was essentially a complete block to the passage of contrast. By bending the head forward, contrast moved above those levels. 2. Facet arthropathy throughout the cervical region as outlined above. 3. C3-4: Disc bulge and uncovertebral prominence. Facet arthropathy left worse than right. Left foraminal narrowing that could affect the left C4 nerve. 4. C4-5: Disc bulge and uncovertebral prominence. Facet arthropathy. Foraminal narrowing that could affect either C5 nerve.  Assessment/Plan: Encounter Diagnoses  Name Primary?   Hoarseness Yes   Dysphonia [R49.0]    Glottic insufficiency    Age-related vocal fold atrophy    Environmental and seasonal allergies    Post-nasal drip    Chronic nasal congestion    Hypertrophy of both inferior nasal turbinates    Chronic GERD     Assessment and Plan Assessment & Plan Voice changes Hoarseness Dysphonia x 1 year Voice changes post spinal stenosis surgery, which worsened after eye surgery requiring intubation. No vocal cord lesions or masses, normal VF mobility on flexible scope exam today, evidence of VF atrophy and GERD LPR/PND. Possible causes: poor lung  recruitment, throat irritation from allergies/PND or GERD LPR, age-related vocal cord atrophy.  - Refer to speech therapy for breath support and voice projection - we discussed referral but he is not sure if therapy is something he will pursue - Continue Xyzal for allergies daily - Start Flonase nasal spray for postnasal drainage. - Use saline spray and apply Vaseline to nostrils to prevent epistaxis. - Provided information on reflux management, including dietary and lifestyle changes. - Recommend reflux supplement after large meals, reflux gourmet, preferably after dinner and/or after large meals.   GERD LPR -  Reflux Gourmet after meals - diet and lifestyle changes to minimize GERD - Refer to Jamie Koufman's blog for dietary and lifestyle modifications/reflux cook book  Environmental Allergies, post-nasal drainage  May contribute to throat irritation and voice changes. Currently taking Xyzal. - Continue Xyzal for allergies. - Start Flonase nasal spray for postnasal drainage. - Use saline spray and apply Vaseline to nostrils to prevent epistaxis..  Follow-up - Instructed to call and schedule an appointment if symptoms change/worsen - referral to speech - he will consider and schedule if interested in pursuing   Thank you for allowing me to participate in the care of this patient. Please do not hesitate to contact me with any questions or concerns.   Artice Last, MD Otolaryngology [facility] Phone: [phone] Fax: [phone]  04/15/2024, 2:07 PM

## 2024-06-19 LAB — HM DIABETES EYE EXAM

## 2024-07-03 ENCOUNTER — Other Ambulatory Visit: Payer: Self-pay | Admitting: Orthopedic Surgery

## 2024-07-14 ENCOUNTER — Other Ambulatory Visit: Payer: Medicare HMO

## 2024-07-14 DIAGNOSIS — R7303 Prediabetes: Secondary | ICD-10-CM

## 2024-07-14 DIAGNOSIS — I10 Essential (primary) hypertension: Secondary | ICD-10-CM

## 2024-07-14 DIAGNOSIS — E7800 Pure hypercholesterolemia, unspecified: Secondary | ICD-10-CM

## 2024-07-15 ENCOUNTER — Ambulatory Visit: Payer: Self-pay | Admitting: Orthopedic Surgery

## 2024-07-15 LAB — COMPLETE METABOLIC PANEL WITHOUT GFR
AG Ratio: 2 (calc) (ref 1.0–2.5)
ALT: 15 U/L (ref 9–46)
AST: 20 U/L (ref 10–35)
Albumin: 4.7 g/dL (ref 3.6–5.1)
Alkaline phosphatase (APISO): 72 U/L (ref 35–144)
BUN: 13 mg/dL (ref 7–25)
CO2: 29 mmol/L (ref 20–32)
Calcium: 9.7 mg/dL (ref 8.6–10.3)
Chloride: 102 mmol/L (ref 98–110)
Creat: 1.11 mg/dL (ref 0.70–1.22)
Globulin: 2.3 g/dL (ref 1.9–3.7)
Glucose, Bld: 87 mg/dL (ref 65–99)
Potassium: 4.6 mmol/L (ref 3.5–5.3)
Sodium: 139 mmol/L (ref 135–146)
Total Bilirubin: 0.6 mg/dL (ref 0.2–1.2)
Total Protein: 7 g/dL (ref 6.1–8.1)

## 2024-07-15 LAB — CBC WITH DIFFERENTIAL/PLATELET
Absolute Lymphocytes: 2817 {cells}/uL (ref 850–3900)
Absolute Monocytes: 810 {cells}/uL (ref 200–950)
Basophils Absolute: 54 {cells}/uL (ref 0–200)
Basophils Relative: 0.6 %
Eosinophils Absolute: 243 {cells}/uL (ref 15–500)
Eosinophils Relative: 2.7 %
HCT: 45.4 % (ref 38.5–50.0)
Hemoglobin: 14.7 g/dL (ref 13.2–17.1)
MCH: 29.5 pg (ref 27.0–33.0)
MCHC: 32.4 g/dL (ref 32.0–36.0)
MCV: 91 fL (ref 80.0–100.0)
MPV: 10.1 fL (ref 7.5–12.5)
Monocytes Relative: 9 %
Neutro Abs: 5076 {cells}/uL (ref 1500–7800)
Neutrophils Relative %: 56.4 %
Platelets: 388 Thousand/uL (ref 140–400)
RBC: 4.99 Million/uL (ref 4.20–5.80)
RDW: 12.4 % (ref 11.0–15.0)
Total Lymphocyte: 31.3 %
WBC: 9 Thousand/uL (ref 3.8–10.8)

## 2024-07-15 LAB — HEMOGLOBIN A1C
Hgb A1c MFr Bld: 6 % — ABNORMAL HIGH (ref <5.7)
Mean Plasma Glucose: 126 mg/dL
eAG (mmol/L): 7 mmol/L

## 2024-07-15 LAB — LIPID PANEL
Cholesterol: 125 mg/dL (ref <200)
HDL: 48 mg/dL (ref >=40)
LDL Cholesterol (Calc): 60 mg/dL
Non-HDL Cholesterol (Calc): 77 mg/dL (ref <130)
Total CHOL/HDL Ratio: 2.6 (calc) (ref <5.0)
Triglycerides: 88 mg/dL (ref <150)

## 2024-07-17 ENCOUNTER — Encounter: Payer: Self-pay | Admitting: Orthopedic Surgery

## 2024-07-17 ENCOUNTER — Ambulatory Visit: Payer: Medicare HMO | Admitting: Orthopedic Surgery

## 2024-07-17 VITALS — BP 130/70 | HR 61 | Temp 97.3°F | Resp 16 | Ht 65.5 in | Wt 146.0 lb

## 2024-07-17 DIAGNOSIS — Z9109 Other allergy status, other than to drugs and biological substances: Secondary | ICD-10-CM

## 2024-07-17 DIAGNOSIS — R7303 Prediabetes: Secondary | ICD-10-CM | POA: Diagnosis not present

## 2024-07-17 DIAGNOSIS — E7800 Pure hypercholesterolemia, unspecified: Secondary | ICD-10-CM | POA: Diagnosis not present

## 2024-07-17 DIAGNOSIS — I10 Essential (primary) hypertension: Secondary | ICD-10-CM

## 2024-07-17 DIAGNOSIS — L821 Other seborrheic keratosis: Secondary | ICD-10-CM

## 2024-07-17 NOTE — Patient Instructions (Addendum)
 Consider switching to Xyzal for 1-3 weeks> best to take night > stop Zyrtec when you do this  Continue Flonase   Please get flu vaccine by 10/18/2024  Please get Prevnar 20  Fasting labs next visit

## 2024-07-17 NOTE — Progress Notes (Unsigned)
 Careteam: Patient Care Team: Gil Greig BRAVO, NP as PCP - General (Adult Health Nurse Practitioner) Cleatus Collar, MD as Consulting Physician (Ophthalmology) Rollin Dover, MD as Consulting Physician (Gastroenterology) [facility]  Seen by: Greig Gil, AGNP-C  PLACE OF SERVICE:  [facility]  Advanced Directive information Does Patient Have a Medical Advance Directive?: Yes, Type of Advance Directive: Healthcare Power of [place];Living will, Does patient want to make changes to medical advance directive?: No - Patient declined  Allergies  Allergen Reactions   Penicillins Swelling   Shellfish Allergy Swelling    Chief Complaint  Patient presents with   Medical Management of Chronic Issues    6 month follow up. Discuss the need for Covid Booster, and Foot exam.   Labs     Discuss recent labs     HPI: Patient is a 83 y.o. male seen today for medical management of chronic conditions.   Discussed the use of AI scribe software for clinical note transcription with the patient, who gave verbal consent to proceed.  History of Present Illness    Recent A1c continues to indicate prediabetes. Diet controlled. No hypoglycemic events.   He has a history of high blood pressure. Remains in lisinopril -hydrochlorothiazide .   Remains on atorvastatin  for elevated cholesterol.   He experiences excessive mucus in his throat, described as 'like a clot' that he needs to expel frequently, including during the night. He has been using Flonase  in the morning and Zyrtec at night for the past couple of months, but these have not alleviated his symptoms. He was seen by ENT and they recommended Xyzal and mucinex. He is not taking either at this time. Symptoms thought to be related to allergies and PND.   He is currently seeing a podiatrist for toe fungus and reports that his feet are doing okay, although he experiences occasional toe locking which resolves with movement. No  numbness, tingling, cuts, calluses, or skin breakdown on his feet.  Developing small brown lesions on back. Requesting dermatology referral.   He mentions a recent change in his recliner, which has improved his comfort and reduced pain he previously experienced. The new recliner provides better support and includes features like infrared heating and massage.  No chest pain, shortness of breath, or urinary issues.       Review of Systems:  Review of Systems  Constitutional: Negative.   HENT:  Positive for congestion. Negative for sore throat.   Eyes:  Positive for blurred vision.  Respiratory:  Positive for shortness of breath. Negative for cough, sputum production and wheezing.   Cardiovascular: Negative.   Gastrointestinal: Negative.   Genitourinary: Negative.   Musculoskeletal:  Positive for back pain and joint pain.  Skin:        Skin lesion to back  Neurological: Negative.   Psychiatric/Behavioral: Negative.      Past Medical History:  Diagnosis Date   Abdominal pain, other specified site    Backache, unspecified    Carpal tunnel syndrome    Diabetes mellitus without complication (HCC)    Essential hypertension, benign    H/O hand surgery 02/25/2020   R hand    Hyperlipidemia    Impacted cerumen    Impaired fasting glucose    Other abnormal blood chemistry    Pain in joint, lower leg    Special screening for malignant neoplasm of prostate    Unspecified essential hypertension    Unspecified hypothyroidism    Past Surgical History:  Procedure  Laterality Date   (L) KNEE SURGERYTORN MENISCUS Left 09/30/2008   ANTERIOR CERVICAL DECOMP/DISCECTOMY FUSION N/A 06/01/2023   Procedure: Anterior Cervical Decompression Fusion - Cervical five-Cervical six - Cervical six-Cervical seven;  Surgeon: Joshua Alm Hamilton, MD;  Location: [facility] OR;  Service: Neurosurgery;  Laterality: N/A;   BACK SURGERY  03/03/2010   fusion   DG HAND RIGHT COMPLETE ([facility] HX) Right 12/25/2022   EYE  SURGERY Left 11/24/2014   cataracts   HAND SURGERY Right 02/25/2020   hand surgery Right 12/25/2022   removed 2 growths, benign   hemorhoid     banding of rectal hemorrhoid   SHOULDER SURGERY Right 10/06/2019   TONSILLECTOMY  1953   Social History:   reports that he quit smoking about 58 years ago. His smoking use included cigarettes. He started smoking about 71 years ago. He has never used smokeless tobacco. He reports that he does not drink alcohol and does not use drugs.  Family History  Problem Relation Age of Onset   Alzheimer's disease Sister    Cancer Brother 27       lung   Diabetes Sister    Diabetes Sister    Lung cancer Sister 97       mets to back   Kidney disease Sister    Allergies Sister    Obesity Sister    Cancer Mother     Medications: Patient's Medications  New Prescriptions   No medications on file  Previous Medications   ASCORBIC ACID (VITAMIN C) 1000 MG TABLET    Take 1,000 mg by mouth daily.   ASPIRIN 81 MG TABLET    Take 81 mg by mouth daily.   ATORVASTATIN  (LIPITOR) 40 MG TABLET    TAKE 1 TABLET DAILY TO     LOWER CHOLESTEROL   B COMPLEX VITAMINS CAPSULE    Take 1 capsule by mouth daily.   FLUTICASONE  (FLONASE ) 50 MCG/ACT NASAL SPRAY    Place 2 sprays into both nostrils daily.   LATANOPROST  (XALATAN ) 0.005 % OPHTHALMIC SOLUTION    Place 1 drop into both eyes at bedtime.   LISINOPRIL -HYDROCHLOROTHIAZIDE  (ZESTORETIC ) 20-12.5 MG TABLET    Take 1 tablet by mouth daily.   METFORMIN  (GLUCOPHAGE ) 500 MG TABLET    TAKE 1 TABLET IN THE MORNING AND ONE-HALF (1/2) TABLET IN THE EVENING   MULTIPLE VITAMINS-MINERALS (MULTIVITAMIN & MINERAL PO)    Take 1 tablet by mouth daily.   NAPROXEN SODIUM (ALEVE) 220 MG TABLET    Take 220 mg by mouth daily as needed (pain).   OMEGA-3 FATTY ACIDS (FISH OIL) 1000 MG CAPS    Take 1 capsule by mouth daily.   SILDENAFIL  (VIAGRA ) 50 MG TABLET    TAKE 1 TABLET AS NEEDED FOR ERECTILE DYSFUNCTION  Modified Medications   No  medications on file  Discontinued Medications   No medications on file    Physical Exam:  Vitals:   07/17/24 0958  BP: (!) 144/70  Pulse: 61  Resp: 16  Temp: (!) 97.3 F (36.3 C)  SpO2: 98%  Weight: 146 lb (66.2 kg)  Height: 5' 5.5 (1.664 m)   Body mass index is 23.93 kg/m. Wt Readings from Last 3 Encounters:  07/17/24 146 lb (66.2 kg)  04/15/24 142 lb (64.4 kg)  03/27/24 142 lb 3.2 oz (64.5 kg)    Physical Exam Vitals reviewed.  Constitutional:      General: He is not in acute distress. HENT:     Head: Normocephalic.  Eyes:  General:        Right eye: No discharge.        Left eye: No discharge.  Cardiovascular:     Rate and Rhythm: Normal rate and regular rhythm.     Pulses: Normal pulses.     Heart sounds: Normal heart sounds.  Pulmonary:     Effort: Pulmonary effort is normal.     Breath sounds: Normal breath sounds.  Abdominal:     General: Bowel sounds are normal.     Palpations: Abdomen is soft.  Musculoskeletal:     Cervical back: Neck supple.     Right lower leg: No edema.     Left lower leg: No edema.  Skin:    General: Skin is warm.     Capillary Refill: Capillary refill takes less than 2 seconds.     Comments: Small raised brown lesion to right lower back, symmetrical, no drainage or tenderness  Neurological:     General: No focal deficit present.     Mental Status: He is alert and oriented to person, place, and time.  Psychiatric:        Mood and Affect: Mood normal.     Labs reviewed: Basic Metabolic Panel: Recent Labs    01/14/24 0904 07/14/24 0832  NA 139 139  K 4.3 4.6  CL 101 102  CO2 30 29  GLUCOSE 92 87  BUN 12 13  CREATININE 1.08 1.11  CALCIUM  9.7 9.7   Liver Function Tests: Recent Labs    01/14/24 0904 07/14/24 0832  AST 21 20  ALT 19 15  BILITOT 0.8 0.6  PROT 6.8 7.0   No results for input(s): LIPASE, AMYLASE in the last 8760 hours. No results for input(s): AMMONIA in the last 8760  hours. CBC: Recent Labs    01/14/24 0904 07/14/24 0832  WBC 9.7 9.0  NEUTROABS 6,218 5,076  HGB 15.1 14.7  HCT 45.1 45.4  MCV 91.3 91.0  PLT 383 388   Lipid Panel: Recent Labs    01/14/24 0904 07/14/24 0832  CHOL 144 125  HDL 50 48  LDLCALC 77 60  TRIG 91 88  CHOLHDL 2.9 2.6   TSH: No results for input(s): TSH in the last 8760 hours. A1C: Lab Results  Component Value Date   HGBA1C 6.0 (H) 07/14/2024     Assessment/Plan 1. Seborrheic keratosis (Primary) - Ambulatory referral to Dermatology  2. Prediabetes - Recent A1c 6.0 - diet controlled - cont asa and lisinopril - hydrochlorothiazide  - cont diet low in carbs and sugars - UTD on eye and foot exam - Hemoglobin A1c; Future  3. Essential hypertension, benign - controlled with lisnopril-HCTZ - CBC with Differential/Platelet; Future - Complete Metabolic Panel with eGFR; Future  4. Pure hypercholesterolemia - LDL < 60, at goal < 70 - cont atorvastatin  - Lipid Panel; Future  5. Environmental allergies - evaluated by ENT> recommended Xyzal and mucinex - intermittent sputum production in evening - ? PND/allergies as cause - stop Zyrtec, try Xyzal - consider restarting mucinex  Total time: 33 minutes. Greater than 50% of total time spent doing patient education regarding prediabetes, HTN, HLD, allergies and skin lesion including symptom/medication management.     Next appt: Visit date not found  Anaya Bovee Gil BODILY  [facility] [phone]

## 2024-08-04 ENCOUNTER — Other Ambulatory Visit: Payer: Self-pay | Admitting: *Deleted

## 2024-08-04 MED ORDER — METFORMIN HCL 500 MG PO TABS: ORAL_TABLET | ORAL | 1 refills | Status: DC

## 2024-08-04 NOTE — Telephone Encounter (Signed)
 [facility] requested refill.

## 2024-09-15 ENCOUNTER — Ambulatory Visit: Payer: Self-pay | Admitting: Orthopedic Surgery

## 2024-10-02 ENCOUNTER — Ambulatory Visit: Admitting: Podiatry

## 2024-10-02 ENCOUNTER — Encounter: Payer: Self-pay | Admitting: Podiatry

## 2024-10-02 DIAGNOSIS — G629 Polyneuropathy, unspecified: Secondary | ICD-10-CM

## 2024-10-02 MED ORDER — GABAPENTIN 100 MG PO CAPS: 100.0000 mg | ORAL_CAPSULE | Freq: Every day | ORAL | 0 refills | Status: AC

## 2024-10-02 NOTE — Patient Instructions (Addendum)
 You can also add ALPHA-LIPOIC ACID  --  Gabapentin  Capsules or Tablets What is this medication? GABAPENTIN  (GAB a PEN tin) treats nerve pain. It may also be used to prevent and control seizures in people with epilepsy. It works by calming overactive nerves in your body. This medicine may be used for other purposes; ask your health care provider or pharmacist if you have questions. COMMON BRAND NAME(S): Active-PAC with Gabapentin , Gabarone , Gralise , Neurontin  What should I tell my care team before I take this medication? They need to know if you have any of these conditions: Have or have had depression Kidney disease Lung or breathing disease, such as asthma or COPD Substance use disorder Suicidal thoughts, plans, or attempt An unusual or allergic reaction to gabapentin , other medications, foods, dyes, or preservatives Pregnant or trying to get pregnant Breastfeeding How should I use this medication? Take this medication by mouth with a glass of water. Follow the directions on the prescription label. You can take it with or without food. If it upsets your stomach, take it with food. Take your medication at regular intervals. Do not take it more often than directed. Do not stop taking except on your care team's advice. If you are directed to break the 600 or 800 mg tablets in half as part of your dose, the extra half tablet should be used for the next dose. If you have not used the extra half tablet within 28 days, it should be thrown away. A special MedGuide will be given to you by the pharmacist with each prescription and refill. Be sure to read this information carefully each time. Talk to your care team about the use of this medication in children. While this medication may be prescribed for children as young as 3 years for selected conditions, precautions do apply. Overdosage: If you think you have taken too much of this medicine contact a poison control center or emergency room at  once. NOTE: This medicine is only for you. Do not share this medicine with others. What if I miss a dose? If you miss a dose, take it as soon as you can. If it is almost time for your next dose, take only that dose. Do not take double or extra doses. What may interact with this medication? Alcohol Antihistamines for allergy, cough, and cold Certain medications for anxiety or sleep Certain medications for depression like amitriptyline, fluoxetine, sertraline Certain medications for seizures like phenobarbital, primidone Certain medications for stomach problems General anesthetics like halothane, isoflurane, methoxyflurane, propofol  Local anesthetics like lidocaine , pramoxine, tetracaine Medications that relax muscles for surgery Opioid medications for pain Phenothiazines like chlorpromazine, mesoridazine, prochlorperazine, thioridazine This list may not describe all possible interactions. Give your health care provider a list of all the medicines, herbs, non-prescription drugs, or dietary supplements you use. Also tell them if you smoke, drink alcohol, or use illegal drugs. Some items may interact with your medicine. What should I watch for while using this medication? Visit your care team for regular checks on your progress. Tell your care team if your symptoms do not start to get better or if they get worse. Do not suddenly stop taking this medication. You may develop a severe reaction. Your care team will tell you how much medication to take. If your care team wants you to stop the medication, the dose may be slowly lowered over time to avoid any side effects. This medication may affect your coordination, reaction time, or judgment. Do not drive or operate machinery  until you know how this medication affects you. Sit up or stand slowly to reduce the risk of dizzy or fainting spells. Drinking alcohol with this medication can increase the risk of these side effects. Taking this medication with  other CNS depressants can make you too sleepy. This can make it hard to breathe and stay awake. In some cases, it can cause coma and death. CNS depressants include opioids, benzodiazepines, muscle relaxants, medications for sleep, and alcohol. Talk to your care team about all the medications, vitamins, and supplements you take. They can tell you what is safe to take together. Call emergency services right away if you have slow or shallow breathing, feel dizzy or confused, or have trouble staying awake. This medication can cause new or worsening depression and increase the risk of suicidal thoughts and actions in a small number of people. This can happen while you are taking this medication or after stopping it. Talk to your care team right away if you have changes in mood or behavior or thoughts of self-harm or suicide. They can help you. This medication may cause serious skin reactions. They can happen weeks to months after starting the medication. Talk to your care team right away if you have fevers or flu-like symptoms with a rash. The rash may be red or purple and then turn into blisters or peeling of the skin. Or you might notice a red rash with swelling of the face, lips, or lymph nodes in your neck or under your arms. If you take this medication for seizures, wear a medical ID bracelet or chain. Carry a card that describes your condition. List the medications and doses you take on the card. Talk to your care team if you may be pregnant. There are benefits and risks to taking medications during pregnancy. Your care team can help you find the option that works for you. What side effects may I notice from receiving this medication? Side effects that you should report to your care team as soon as possible: Allergic reactions or angioedema--skin rash, itching or hives, swelling of the face, eyes, lips, tongue, arms, or legs, trouble swallowing or breathing Rash, fever, and swollen lymph nodes Thoughts of  suicide or self-harm, worsening mood, feelings of depression Trouble breathing Unusual changes in mood or behavior in children after use such as trouble concentrating, hostility, or restlessness Side effects that usually do not require medical attention (report these to your care team if they continue or are bothersome): Dizziness Drowsiness Nausea Swelling of the ankles, hands, or feet Vomiting This list may not describe all possible side effects. Call your doctor for medical advice about side effects. You may report side effects to FDA at 1-800-FDA-1088. Where should I keep my medication? Keep out of reach of children and pets. Store at room temperature between 15 and 30 degrees C (59 and 86 degrees F). Get rid of any unused medication after the expiration date. This medication may cause accidental overdose and death if taken by other adults, children, or pets. To get rid of medications that are no longer needed or have expired: Take the medication to a medication take-back program. Check with your pharmacy or law enforcement to find a location. If you cannot return the medication, check the label or package insert to see if the medication should be thrown out in the garbage or flushed down the toilet. If you are not sure, ask your care team. If it is safe to put it in the trash, empty  the medication out of the container. Mix the medication with cat litter, dirt, coffee grounds, or other unwanted substance. Seal the mixture in a bag or container. Put it in the trash. NOTE: This sheet is a summary. It may not cover all possible information. If you have questions about this medicine, talk to your doctor, pharmacist, or health care provider.  2025 Elsevier/Gold Standard (2024-04-21 00:00:00)   --   Nerve Damage (Peripheral Neuropathy): What to Know Peripheral neuropathy happens when there's damage to the peripheral nerves. These nerves send signals between your spinal cord and your arms and  legs. You can have damage in one or more nerves. What are the causes? There are many reasons why nerves can get damaged. Damage may be caused by some diseases, such as: Diabetes. This is the most common cause. Autoimmune diseases, such as rheumatoid arthritis or lupus. These happen when your body's defense system (immune system) attacks healthy parts of your body by mistake. Inherited nerve disease. This is passed down in families. Kidney disease. Thyroid disease. Other causes include: Pressure or stress on a nerve that lasts a long time. Lack of B vitamins from poor diet or drinking too much alcohol. Infections. Some medicines, like chemotherapy used for cancer treatment. Poisonous (toxic) substances, such as lead or mercury. Too little blood flowing to the legs. Sometimes, the cause isn't known. What are the signs or symptoms? Symptoms depend on which nerves are damaged. In your legs, hands, or arms, you may have: Loss of feeling (numbness). Tingling. Burning pain. Very sensitive skin. Weakness. Paralysis. This is when you can't move a part of your body. Clumsiness. Muscle twitching. Loss of balance. You may have trouble walking. Other symptoms can include: Not being able to control when you pee. Feeling dizzy. Problems during sex. How is this diagnosed? Your health care provider will: Ask about your symptoms and medical history. Do a physical exam. Do a neurological exam. They will check your reflexes, how you move, and what you can feel. You may need to have other tests, such as: Blood tests. Nerve tests to check how well your nerves and muscles work. Imaging tests, such as a CT scan or MRI. These are done to rule out other causes. Nerve biopsy. This is when a small piece of a nerve is removed for testing. Lumbar puncture. A small amount of the fluid that surrounds the brain and spinal cord is taken and checked in a lab. How is this treated? Treatment may  include: Treating the cause of the nerve damage. Pain medicines. Other medicines that may help with pain, such as: Medicines that treat seizures. Medicines that treat depression. Pain patches or creams that are put on painful areas of skin. TENS therapy. This uses a device that sends mild electrical pulses to your nerves. This will prevent pain signals from reaching the brain. Massage. Acupuncture. Surgery to relieve pressure on a nerve or to destroy a nerve that's causing pain. This is done only if the other treatments are not helping. You may also need physical therapy to help improve your movement, balance, and muscle strength. You may be told to use a cane or walker if needed. Follow these instructions at home: Medicines Take your medicines only as told. You may need to take steps to help treat or prevent trouble pooping (constipation), such as: Taking medicines to help you poop. Eating foods high in fiber, like beans, whole grains, and fresh fruits and vegetables. Drinking more fluids as told. Ask your provider  if it's safe to drive or use machines while taking your medicine. Lifestyle  Do not smoke, vape, or use nicotine or tobacco. Avoid or limit alcohol. Too much alcohol can be harmful to nerves and cause damage. Eat a healthy diet. Safety  Take care to avoid burning or damaging your skin if you have numbness. If you have numbness in your feet: Check your feet each day for signs of injury or infection. Watch for redness, warmth, and swelling. Wear padded socks and comfortable shoes. These help protect your feet. General instructions If you have diabetes, keep your blood sugar under control. Develop a good support system. Living with nerve damage can cause a lot of stress. Talk with a mental health therapist or join a support group. Exercise as told. Ask what things are safe for you to do at home. Work with a pain specialist to find the best way to manage your pain. Keep  all follow-up visits. Your provider will check if the treatments are working and change them if needed. Where to find support The Foundation for Peripheral Neuropathy: BudgetMan[employer].si Where to find more information To learn more, go to: [employer] of Neurological Disorders and Stroke at BasicFM.no. Click Search and type peripheral neuropathy. Find the link you need. Contact a health care provider if: Your pain treatments are not working. Your symptoms are getting worse. You have side effects from any of your medicines. You have new symptoms. You're having a hard time coping with your condition. Get help right away if: You have a wound that's not healing. You have new weakness in an arm or leg. You've fallen or you fall often. This information is not intended to replace advice given to you by your health care provider. Make sure you discuss any questions you have with your health care provider. Document Revised: 02/28/2024 Document Reviewed: 02/28/2024 Elsevier Patient Education  2025 [employer].

## 2024-10-03 ENCOUNTER — Telehealth: Payer: Self-pay | Admitting: Lab

## 2024-10-03 NOTE — Telephone Encounter (Signed)
 Patient states you recommended a medication over the counter and has forgot the name of medication can you reply on my chart so he can purchase.

## 2024-10-06 NOTE — Progress Notes (Signed)
 Subjective:   Patient ID: Tyler Hull, male   DOB: 83 y.o.   MRN: [id number]   HPI Chief Complaint  Patient presents with   Toe Pain    Rm11 Patient complain of numbess and tingling in both big toes for several months/   83 year old male presents the office with concerns of numbness, tingling to his big toes.  This been ongoing for quite some time but seems to be getting a little bit worse.  He does not notice any radiating pain or any symptoms to the other toes.   Review of Systems  All other systems reviewed and are negative.  Past Medical History:  Diagnosis Date   Abdominal pain, other specified site    Backache, unspecified    Carpal tunnel syndrome    Diabetes mellitus without complication (HCC)    Essential hypertension, benign    H/O hand surgery 02/25/2020   R hand    Hyperlipidemia    Impacted cerumen    Impaired fasting glucose    Other abnormal blood chemistry    Pain in joint, lower leg    Special screening for malignant neoplasm of prostate    Unspecified essential hypertension    Unspecified hypothyroidism     Past Surgical History:  Procedure Laterality Date   (L) KNEE SURGERYTORN MENISCUS Left 09/30/2008   ANTERIOR CERVICAL DECOMP/DISCECTOMY FUSION N/A 06/01/2023   Procedure: Anterior Cervical Decompression Fusion - Cervical five-Cervical six - Cervical six-Cervical seven;  Surgeon: Joshua Alm Hamilton, MD;  [facility] OR;  Service: Neurosurgery;  Laterality: N/A;   BACK SURGERY  03/03/2010   fusion   DG HAND RIGHT COMPLETE ([facility] HX) Right 12/25/2022   EYE SURGERY Left 11/24/2014   cataracts   HAND SURGERY Right 02/25/2020   hand surgery Right 12/25/2022   removed 2 growths, benign   hemorhoid     banding of rectal hemorrhoid   SHOULDER SURGERY Right 10/06/2019   TONSILLECTOMY  1953     Current Outpatient Medications:    Ascorbic Acid (VITAMIN C) 1000 MG tablet, Take 1,000 mg by mouth daily., Disp: , Rfl:    aspirin 81 MG tablet, Take 81  mg by mouth daily., Disp: , Rfl:    atorvastatin  (LIPITOR) 40 MG tablet, TAKE 1 TABLET DAILY TO     LOWER CHOLESTEROL, Disp: 90 tablet, Rfl: 1   b complex vitamins capsule, Take 1 capsule by mouth daily., Disp: , Rfl:    fluticasone  (FLONASE ) 50 MCG/ACT nasal spray, Place 2 sprays into both nostrils daily., Disp: 16 g, Rfl: 6   gabapentin  (NEURONTIN ) 100 MG capsule, Take 1 capsule (100 mg total) by mouth at bedtime., Disp: 90 capsule, Rfl: 0   latanoprost  (XALATAN ) 0.005 % ophthalmic solution, Place 1 drop into both eyes at bedtime., Disp: , Rfl:    lisinopril -hydrochlorothiazide  (ZESTORETIC ) 20-12.5 MG tablet, Take 1 tablet by mouth daily., Disp: 90 tablet, Rfl: 2   metFORMIN  (GLUCOPHAGE ) 500 MG tablet, TAKE 1 TABLET IN THE MORNING AND ONE-HALF (1/2) TABLET IN THE EVENING, Disp: 135 tablet, Rfl: 1   Multiple Vitamins-Minerals (MULTIVITAMIN & MINERAL PO), Take 1 tablet by mouth daily., Disp: , Rfl:    naproxen sodium (ALEVE) 220 MG tablet, Take 220 mg by mouth daily as needed (pain)., Disp: , Rfl:    Omega-3 Fatty Acids (FISH OIL) 1000 MG CAPS, Take 1 capsule by mouth daily., Disp: , Rfl:    sildenafil  (VIAGRA ) 50 MG tablet, TAKE 1 TABLET AS NEEDED FOR ERECTILE DYSFUNCTION, Disp: 30  tablet, Rfl: 3  Allergies  Allergen Reactions   Penicillins Swelling   Shellfish Allergy Swelling           Objective:  Physical Exam  General: AAO x3, NAD  Dermatological: Skin is warm, dry and supple bilateral.  There are no open sores, no preulcerative lesions, no rash or signs of infection present.  Vascular: Dorsalis Pedis artery and Posterior Tibial artery pedal pulses are 2/4 bilateral with immedate capillary fill time.  There is no pain with calf compression, swelling, warmth, erythema.   Neruologic: Overall sensation intact with Semmes Weinstein monofilament.  Slight decrease along the hallux.  Otherwise intact.  Musculoskeletal: There is no area of pinpoint tenderness identified at this  time.  Gait: Unassisted, Nonantalgic.       Assessment:   83 year old male with concern for early neuropathy     Plan:  -Treatment options discussed including all alternatives, risks, and complications -Etiology of symptoms were discussed - Discussed neuropathy as well as treatment options for this.  Discussed B complex vitamin as well as alpha lipoic acid.  He is already taking supplements.  Discussed gabapentin  and he wants to try this.  Discussed side effects, success rates.  There also could be some component of vascular disease as there is some microvascular disease noted on arterial studies.  Discussed daily foot inspection, supportive offloading shoes.  Return in about 3 months (around 01/02/2025).  Donnice JONELLE Fees DPM

## 2024-10-09 ENCOUNTER — Other Ambulatory Visit: Payer: Self-pay | Admitting: Orthopedic Surgery

## 2024-10-09 DIAGNOSIS — R809 Proteinuria, unspecified: Secondary | ICD-10-CM

## 2024-11-25 ENCOUNTER — Other Ambulatory Visit: Payer: Self-pay | Admitting: Orthopedic Surgery

## 2025-01-05 ENCOUNTER — Ambulatory Visit: Admitting: Podiatry

## 2025-01-05 DIAGNOSIS — B351 Tinea unguium: Secondary | ICD-10-CM | POA: Diagnosis not present

## 2025-01-05 DIAGNOSIS — M79674 Pain in right toe(s): Secondary | ICD-10-CM

## 2025-01-05 DIAGNOSIS — M79675 Pain in left toe(s): Secondary | ICD-10-CM

## 2025-01-05 NOTE — Progress Notes (Signed)
 Subjective:   Patient ID: Tyler Hull, male   DOB: 84 y.o.   MRN: [id number]   HPI Chief Complaint  Patient presents with   [facility]    NIDDM patient with an A1C of 6.0 presents to office for [facility] and nail trim patient denies numbness and tingling he does report he does experience some cold waves on his feet.    84 year old male presents the office today with concerns of his nails become thick and elongated.  He states they cut the sheets in time and they cause discomfort.  He also states that he does get numbness and send which is been ongoing.  This has not worsened.  Is taking gabapentin .     Objective:  Physical Exam  General: AAO x3, NAD  Dermatological: Nails are hypertrophic, dystrophic, brittle, discolored, elongated 10. No surrounding redness or drainage. Tenderness nails 1-5 bilaterally. No open lesions or pre-ulcerative lesions are identified today.  Vascular: Dorsalis Pedis artery and Posterior Tibial artery pedal pulses are 2/4 bilateral with immedate capillary fill time.  There is no pain with calf compression, swelling, warmth, erythema.   Neruologic: Overall sensation intact with Semmes Weinstein monofilament.  Slight decrease along the hallux.   Musculoskeletal: There is no area of pinpoint tenderness identified at this time.  Gait: Unassisted, Nonantalgic.       Assessment:   84 year old male with concern for early neuropathy; symptomatic onychomycosis     Plan:  -Treatment options discussed including all alternatives, risks, and complications -Etiology of symptoms were discussed -Sharply debrided nails x 10 without complications or bleeding -Continue gabapentin  as well as B complex supplements.  Return in about 3 months (around 04/05/2025).  Donnice JONELLE Fees DPM

## 2025-01-09 ENCOUNTER — Other Ambulatory Visit: Payer: Self-pay | Admitting: Orthopedic Surgery

## 2025-01-15 LAB — OPHTHALMOLOGY REPORT-SCANNED

## 2025-01-20 ENCOUNTER — Other Ambulatory Visit: Payer: Self-pay

## 2025-01-20 DIAGNOSIS — I10 Essential (primary) hypertension: Secondary | ICD-10-CM

## 2025-01-20 DIAGNOSIS — R7303 Prediabetes: Secondary | ICD-10-CM

## 2025-01-20 DIAGNOSIS — E7800 Pure hypercholesterolemia, unspecified: Secondary | ICD-10-CM

## 2025-01-21 LAB — COMPLETE METABOLIC PANEL WITHOUT GFR
AG Ratio: 1.8 (calc) (ref 1.0–2.5)
ALT: 18 U/L (ref 9–46)
AST: 19 U/L (ref 10–35)
Albumin: 4.5 g/dL (ref 3.6–5.1)
Alkaline phosphatase (APISO): 61 U/L (ref 35–144)
BUN: 9 mg/dL (ref 7–25)
CO2: 30 mmol/L (ref 20–32)
Calcium: 9.5 mg/dL (ref 8.6–10.3)
Chloride: 103 mmol/L (ref 98–110)
Creat: 0.96 mg/dL (ref 0.70–1.22)
Globulin: 2.5 g/dL (ref 1.9–3.7)
Glucose, Bld: 89 mg/dL (ref 65–99)
Potassium: 4.5 mmol/L (ref 3.5–5.3)
Sodium: 140 mmol/L (ref 135–146)
Total Bilirubin: 0.7 mg/dL (ref 0.2–1.2)
Total Protein: 7 g/dL (ref 6.1–8.1)

## 2025-01-21 LAB — CBC WITH DIFFERENTIAL/PLATELET
Absolute Lymphocytes: 2373 {cells}/uL (ref 850–3900)
Absolute Monocytes: 697 {cells}/uL (ref 200–950)
Basophils Absolute: 41 {cells}/uL (ref 0–200)
Basophils Relative: 0.5 %
Eosinophils Absolute: 162 {cells}/uL (ref 15–500)
Eosinophils Relative: 2 %
HCT: 42 % (ref 39.4–51.1)
Hemoglobin: 14.1 g/dL (ref 13.2–17.1)
MCH: 30.4 pg (ref 27.0–33.0)
MCHC: 33.6 g/dL (ref 31.6–35.4)
MCV: 90.5 fL (ref 81.4–101.7)
MPV: 10.5 fL (ref 7.5–12.5)
Monocytes Relative: 8.6 %
Neutro Abs: 4828 {cells}/uL (ref 1500–7800)
Neutrophils Relative %: 59.6 %
Platelets: 540 10*3/uL — ABNORMAL HIGH (ref 140–400)
RBC: 4.64 Million/uL (ref 4.20–5.80)
RDW: 11.9 % (ref 11.0–15.0)
Total Lymphocyte: 29.3 %
WBC: 8.1 10*3/uL (ref 3.8–10.8)

## 2025-01-21 LAB — LIPID PANEL
Cholesterol: 128 mg/dL
HDL: 46 mg/dL
LDL Cholesterol (Calc): 64 mg/dL
Non-HDL Cholesterol (Calc): 82 mg/dL
Total CHOL/HDL Ratio: 2.8 (calc)
Triglycerides: 96 mg/dL

## 2025-01-21 LAB — HEMOGLOBIN A1C
Hgb A1c MFr Bld: 5.8 % — ABNORMAL HIGH
Mean Plasma Glucose: 120 mg/dL
eAG (mmol/L): 6.6 mmol/L

## 2025-01-22 ENCOUNTER — Ambulatory Visit: Payer: Self-pay | Admitting: Orthopedic Surgery

## 2025-01-22 ENCOUNTER — Encounter: Payer: Self-pay | Admitting: Orthopedic Surgery

## 2025-01-22 VITALS — BP 118/60 | HR 93 | Temp 98.1°F | Ht 65.5 in | Wt 139.4 lb

## 2025-01-22 DIAGNOSIS — E1142 Type 2 diabetes mellitus with diabetic polyneuropathy: Secondary | ICD-10-CM

## 2025-01-22 DIAGNOSIS — I10 Essential (primary) hypertension: Secondary | ICD-10-CM

## 2025-01-22 DIAGNOSIS — Z7984 Long term (current) use of oral hypoglycemic drugs: Secondary | ICD-10-CM

## 2025-01-22 DIAGNOSIS — E7800 Pure hypercholesterolemia, unspecified: Secondary | ICD-10-CM

## 2025-01-22 MED ORDER — METFORMIN HCL 500 MG PO TABS: 500.0000 mg | ORAL_TABLET | Freq: Every day | ORAL | Status: AC

## 2025-01-22 NOTE — Progress Notes (Signed)
 "   Careteam: Patient Care Team: Gil Greig BRAVO, NP as PCP - General (Adult Health Nurse Practitioner) Cleatus Collar, MD as Consulting Physician (Ophthalmology) Rollin Dover, MD as Consulting Physician (Gastroenterology) [facility]  Seen by: Greig Gil, AGNP-C  PLACE OF SERVICE:  [facility]  Advanced Directive information    Allergies[1]  Chief Complaint  Patient presents with   Medication management of chronic issues    6 month follow up     HPI: Patient is a 84 y.o. male seen today for medical management of chronic conditions.   Discussed the use of AI scribe software for clinical note transcription with the patient, who gave verbal consent to proceed.  History of Present Illness   Tyler Hull is an 84 year old male with type 2 diabetes who presents for routine follow-up.  He has type 2 diabetes managed with metformin , taking one tablet in the morning and a half tablet in the evening. His recent A1c is 5.8, which he attributes to dietary changes, including reducing carbohydrate intake. He has experienced weight loss from 146 pounds in July to 139 pounds currently, which he attributes to having the flu last month and making healthier food choices. He consumes a Pepsi daily and eats a sandwich at lunch, but otherwise maintains a consistent diet.  He has a history of neuropathy, for which he takes gabapentin  at bedtime. He describes symptoms of coldness and numbness in his big toes, which sometimes flare up. He takes gabapentin  at bedtime for neuropathy and sometimes takes two tablets on particularly bad nights for relief.  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 The hole is completely closed, but he still experiences floaters in his eye. Recovery from the surgery took about five months instead of one.  Followed by orthopedics. He has a history of hip pain for which he previously received injections. He is also receiving gel  injection to knees.   He has food allergies and is considering obtaining an EpiPen  or similar device for an upcoming [place] trip to prevent allergic reactions.   Review of Systems:  Review of Systems  Constitutional: Negative.   HENT: Negative.    Eyes: Negative.   Respiratory: Negative.    Cardiovascular: Negative.   Gastrointestinal: Negative.   Genitourinary: Negative.   Musculoskeletal:  Positive for joint pain.  Skin: Negative.   Neurological:  Positive for sensory change.  Psychiatric/Behavioral: Negative.      Past Medical History:  Diagnosis Date   Abdominal pain, other specified site    Backache, unspecified    Carpal tunnel syndrome    Diabetes mellitus without complication (HCC)    Essential hypertension, benign    H/O hand surgery 02/25/2020   R hand    Hyperlipidemia    Impacted cerumen    Impaired fasting glucose    Other abnormal blood chemistry    Pain in joint, lower leg    Special screening for malignant neoplasm of prostate    Unspecified essential hypertension    Unspecified hypothyroidism    Past Surgical History:  Procedure Laterality Date   (L) KNEE SURGERYTORN MENISCUS Left 09/30/2008   ANTERIOR CERVICAL DECOMP/DISCECTOMY FUSION N/A 06/01/2023   Procedure: Anterior Cervical Decompression Fusion - Cervical five-Cervical six - Cervical six-Cervical seven;  Surgeon: Joshua Alm Hamilton, MD;  Location: [facility] OR;  Service: Neurosurgery;  Laterality: N/A;   BACK SURGERY  03/03/2010   fusion   DG HAND RIGHT COMPLETE [facility]  HX) Right 12/25/2022   EYE SURGERY Left 11/24/2014   cataracts   HAND SURGERY Right 02/25/2020   hand surgery Right 12/25/2022   removed 2 growths, benign   hemorhoid     banding of rectal hemorrhoid   SHOULDER SURGERY Right 10/06/2019   TONSILLECTOMY  1953   Social History:   reports that he quit smoking about 59 years ago. His smoking use included cigarettes. He started smoking about 72 years ago. He has never used smokeless  tobacco. He reports that he does not drink alcohol and does not use drugs.  Family History  Problem Relation Age of Onset   Alzheimer's disease Sister    Cancer Brother 68       lung   Diabetes Sister    Diabetes Sister    Lung cancer Sister 8       mets to back   Kidney disease Sister    Allergies Sister    Obesity Sister    Cancer Mother     Medications: Patient's Medications  New Prescriptions   No medications on file  Previous Medications   ASCORBIC ACID (VITAMIN C) 1000 MG TABLET    Take 1,000 mg by mouth daily.   ASPIRIN 81 MG TABLET    Take 81 mg by mouth daily.   ATORVASTATIN  (LIPITOR) 40 MG TABLET    TAKE 1 TABLET DAILY TO     LOWER CHOLESTEROL   B COMPLEX VITAMINS CAPSULE    Take 1 capsule by mouth daily.   FLUTICASONE  (FLONASE ) 50 MCG/ACT NASAL SPRAY    Place 2 sprays into both nostrils daily.   GABAPENTIN  (NEURONTIN ) 100 MG CAPSULE    Take 1 capsule (100 mg total) by mouth at bedtime.   LATANOPROST  (XALATAN ) 0.005 % OPHTHALMIC SOLUTION    Place 1 drop into both eyes at bedtime.   LISINOPRIL -HYDROCHLOROTHIAZIDE  (ZESTORETIC ) 20-12.5 MG TABLET    TAKE 1 TABLET DAILY   MULTIPLE VITAMINS-MINERALS (MULTIVITAMIN & MINERAL PO)    Take 1 tablet by mouth daily.   NAPROXEN SODIUM (ALEVE) 220 MG TABLET    Take 220 mg by mouth daily as needed (pain).   OMEGA-3 FATTY ACIDS (FISH OIL) 1000 MG CAPS    Take 1 capsule by mouth daily.   SILDENAFIL  (VIAGRA ) 50 MG TABLET    TAKE 1 TABLET AS NEEDED FOR ERECTILE DYSFUNCTION  Modified Medications   Modified Medication Previous Medication   METFORMIN  (GLUCOPHAGE ) 500 MG TABLET metFORMIN  (GLUCOPHAGE ) 500 MG tablet      Take 1 tablet (500 mg total) by mouth daily with breakfast.    TAKE 1 TABLET IN THE       MORNING AND 1/2 TABLET IN  THE EVENING  Discontinued Medications   No medications on file    Physical Exam:  Vitals:   01/22/25 1102  BP: 118/60  Pulse: 93  Temp: 98.1 F (36.7 C)  SpO2: 93%  Weight: 139 lb 6.4 oz (63.2 kg)   Height: 5' 5.5 (1.664 m)   Body mass index is 22.84 kg/m. Wt Readings from Last 3 Encounters:  01/22/25 139 lb 6.4 oz (63.2 kg)  07/17/24 146 lb (66.2 kg)  04/15/24 142 lb (64.4 kg)    Physical Exam Vitals reviewed.  Constitutional:      General: He is not in acute distress. HENT:     Head: Normocephalic.  Eyes:     General:        Right eye: No discharge.  Left eye: No discharge.  Cardiovascular:     Rate and Rhythm: Normal rate and regular rhythm.     Pulses: Normal pulses.     Heart sounds: Normal heart sounds.  Pulmonary:     Effort: Pulmonary effort is normal.     Breath sounds: Normal breath sounds.  Abdominal:     General: Bowel sounds are normal.     Palpations: Abdomen is soft.  Musculoskeletal:     Cervical back: Neck supple.     Right lower leg: No edema.     Left lower leg: No edema.  Skin:    General: Skin is warm.     Capillary Refill: Capillary refill takes less than 2 seconds.  Neurological:     General: No focal deficit present.     Mental Status: He is alert and oriented to person, place, and time.  Psychiatric:        Mood and Affect: Mood normal.     Labs reviewed: Basic Metabolic Panel: Recent Labs    07/14/24 0832 01/20/25 1130  NA 139 140  K 4.6 4.5  CL 102 103  CO2 29 30  GLUCOSE 87 89  BUN 13 9  CREATININE 1.11 0.96  CALCIUM  9.7 9.5   Liver Function Tests: Recent Labs    07/14/24 0832 01/20/25 1130  AST 20 19  ALT 15 18  BILITOT 0.6 0.7  PROT 7.0 7.0   No results for input(s): LIPASE, AMYLASE in the last 8760 hours. No results for input(s): AMMONIA in the last 8760 hours. CBC: Recent Labs    07/14/24 0832 01/20/25 1130  WBC 9.0 8.1  NEUTROABS 5,076 4,828  HGB 14.7 14.1  HCT 45.4 42.0  MCV 91.0 90.5  PLT 388 540*   Lipid Panel: Recent Labs    07/14/24 0832 01/20/25 1130  CHOL 125 128  HDL 48 46  LDLCALC 60 64  TRIG 88 96  CHOLHDL 2.6 2.8   TSH: No results for input(s): TSH in the  last 8760 hours. A1C: Lab Results  Component Value Date   HGBA1C 5.8 (H) 01/20/2025     Assessment/Plan 1. Type 2 diabetes mellitus with diabetic polyneuropathy, without long-term current use of insulin  (HCC) (Primary) - improved, now 5.8 - discontinue metformin  in evening - continue metformin  500 g QAM - UTD on eye exam> followed by Dr. Elner - cont gabapentin  for neuropathy - metFORMIN  (GLUCOPHAGE ) 500 MG tablet; Take 1 tablet (500 mg total) by mouth daily with breakfast.  2. Essential hypertension, benign - controlled with lisinopril -hydrochlorothiazide  - last EKG unknown - EKG next encounter  3. Pure hypercholesterolemia - LDL 64 01/21/2024 - cont atorvastatin    Total time: 32 minutes. Greater than 50% of total time spent doing patient education regarding health maintenance, HTN, HLD, T2DM including symptom/medication management.     Next appt: Visit date not found  Cresta Riden Gil BODILY  [facility] [phone]      [1]  Allergies Allergen Reactions   Penicillins Swelling   Shellfish Allergy Swelling   "

## 2025-01-22 NOTE — Patient Instructions (Signed)
 Stop taking metformin  1/2 tablet in the evening

## 2025-02-26 ENCOUNTER — Ambulatory Visit: Admitting: Dermatology

## 2025-07-20 ENCOUNTER — Other Ambulatory Visit

## 2025-07-23 ENCOUNTER — Ambulatory Visit: Admitting: Orthopedic Surgery
